# Patient Record
Sex: Male | Born: 1935 | Race: White | Hispanic: No | Marital: Single | Smoking: Former smoker
Health system: Southern US, Community
[De-identification: ages and names within clinical notes are randomized; demographics above are authoritative.]

## PROBLEM LIST (undated history)

## (undated) DIAGNOSIS — N400 Enlarged prostate without lower urinary tract symptoms: Secondary | ICD-10-CM

## (undated) DIAGNOSIS — G4733 Obstructive sleep apnea (adult) (pediatric): Secondary | ICD-10-CM

## (undated) DIAGNOSIS — K219 Gastro-esophageal reflux disease without esophagitis: Secondary | ICD-10-CM

## (undated) DIAGNOSIS — E785 Hyperlipidemia, unspecified: Secondary | ICD-10-CM

## (undated) DIAGNOSIS — K4090 Unilateral inguinal hernia, without obstruction or gangrene, not specified as recurrent: Secondary | ICD-10-CM

## (undated) DIAGNOSIS — J383 Other diseases of vocal cords: Secondary | ICD-10-CM

## (undated) DIAGNOSIS — M1990 Unspecified osteoarthritis, unspecified site: Secondary | ICD-10-CM

## (undated) DIAGNOSIS — J45909 Unspecified asthma, uncomplicated: Secondary | ICD-10-CM

## (undated) DIAGNOSIS — F419 Anxiety disorder, unspecified: Secondary | ICD-10-CM

## (undated) DIAGNOSIS — N529 Male erectile dysfunction, unspecified: Secondary | ICD-10-CM

## (undated) DIAGNOSIS — K635 Polyp of colon: Secondary | ICD-10-CM

## (undated) DIAGNOSIS — L719 Rosacea, unspecified: Secondary | ICD-10-CM

## (undated) DIAGNOSIS — J040 Acute laryngitis: Secondary | ICD-10-CM

## (undated) DIAGNOSIS — J309 Allergic rhinitis, unspecified: Secondary | ICD-10-CM

## (undated) HISTORY — DX: Anxiety disorder, unspecified: F41.9

## (undated) HISTORY — DX: Obstructive sleep apnea (adult) (pediatric): G47.33

## (undated) HISTORY — DX: Unspecified osteoarthritis, unspecified site: M19.90

## (undated) HISTORY — DX: Gastro-esophageal reflux disease without esophagitis: K21.9

## (undated) HISTORY — DX: Unilateral inguinal hernia, without obstruction or gangrene, not specified as recurrent: K40.90

## (undated) HISTORY — DX: Other diseases of vocal cords: J38.3

## (undated) HISTORY — DX: Unspecified asthma, uncomplicated: J45.909

## (undated) HISTORY — PX: INGUINAL HERNIA REPAIR: SUR1180

## (undated) HISTORY — DX: Rosacea, unspecified: L71.9

## (undated) HISTORY — DX: Benign prostatic hyperplasia without lower urinary tract symptoms: N40.0

## (undated) HISTORY — DX: Acute laryngitis: J04.0

## (undated) HISTORY — DX: Hyperlipidemia, unspecified: E78.5

## (undated) HISTORY — DX: Male erectile dysfunction, unspecified: N52.9

## (undated) HISTORY — DX: Polyp of colon: K63.5

## (undated) HISTORY — DX: Allergic rhinitis, unspecified: J30.9

---

## 1997-11-22 ENCOUNTER — Encounter: Admission: RE | Admit: 1997-11-22 | Discharge: 1998-02-20 | Payer: Self-pay | Admitting: Family Medicine

## 1998-08-09 ENCOUNTER — Encounter: Payer: Self-pay | Admitting: Surgery

## 1998-08-12 ENCOUNTER — Ambulatory Visit (HOSPITAL_COMMUNITY): Admission: RE | Admit: 1998-08-12 | Discharge: 1998-08-13 | Payer: Self-pay | Admitting: Surgery

## 1998-12-09 ENCOUNTER — Encounter: Admission: RE | Admit: 1998-12-09 | Discharge: 1999-01-07 | Payer: Self-pay | Admitting: Family Medicine

## 2004-05-15 ENCOUNTER — Encounter: Admission: RE | Admit: 2004-05-15 | Discharge: 2004-05-15 | Payer: Self-pay | Admitting: Internal Medicine

## 2007-08-25 HISTORY — PX: COLONOSCOPY: SHX174

## 2008-04-09 ENCOUNTER — Other Ambulatory Visit: Payer: Self-pay

## 2008-04-09 ENCOUNTER — Emergency Department: Payer: Self-pay | Admitting: Emergency Medicine

## 2010-09-25 ENCOUNTER — Other Ambulatory Visit (HOSPITAL_COMMUNITY): Payer: MEDICARE

## 2010-09-26 ENCOUNTER — Ambulatory Visit (HOSPITAL_COMMUNITY): Payer: MEDICARE

## 2010-09-26 ENCOUNTER — Ambulatory Visit (HOSPITAL_COMMUNITY)
Admission: RE | Admit: 2010-09-26 | Discharge: 2010-09-26 | Disposition: A | Discharge status: Home / Self Care | Payer: MEDICARE | Source: Ambulatory Visit | Attending: General Surgery | Admitting: General Surgery

## 2010-09-26 DIAGNOSIS — K4090 Unilateral inguinal hernia, without obstruction or gangrene, not specified as recurrent: Secondary | ICD-10-CM | POA: Insufficient documentation

## 2010-09-26 DIAGNOSIS — Z01818 Encounter for other preprocedural examination: Secondary | ICD-10-CM | POA: Insufficient documentation

## 2010-09-26 LAB — SURGICAL PCR SCREEN
MRSA, PCR: NEGATIVE
Staphylococcus aureus: NEGATIVE

## 2010-09-29 ENCOUNTER — Ambulatory Visit (HOSPITAL_COMMUNITY)
Admission: RE | Admit: 2010-09-29 | Discharge: 2010-09-29 | Disposition: A | Discharge status: Home / Self Care | Payer: MEDICARE | Attending: General Surgery | Admitting: General Surgery

## 2010-09-29 DIAGNOSIS — K4090 Unilateral inguinal hernia, without obstruction or gangrene, not specified as recurrent: Secondary | ICD-10-CM | POA: Insufficient documentation

## 2010-09-29 DIAGNOSIS — J45909 Unspecified asthma, uncomplicated: Secondary | ICD-10-CM | POA: Insufficient documentation

## 2010-09-29 DIAGNOSIS — Z01812 Encounter for preprocedural laboratory examination: Secondary | ICD-10-CM | POA: Insufficient documentation

## 2010-10-04 NOTE — Op Note (Signed)
NAMEVALMORE, ARABIE                 ACCOUNT NO.:  [id number]  MEDICAL RECORD NO.:  [id number]           PATIENT TYPE:  O  LOCATION:  XRAY                         FACILITY:  [facility]  PHYSICIAN:  Adolph Pollack, M.D.DATE OF BIRTH:  25-Sep-1933  DATE OF PROCEDURE: DATE OF DISCHARGE:  09/26/2010                              OPERATIVE REPORT   PREOPERATIVE DIAGNOSIS:  Left inguinal hernia.  POSTOPERATIVE DIAGNOSIS:  Left inguinal hernia.  PROCEDURE:  Left inguinal hernia repair with mesh and On-Q pump placement.  SURGEON:  Adolph Pollack, M.D.  ANESTHESIA:  General/LMA with Marcaine local.  INDICATION:  Mr. Kaltenbach is a 77 year old male who has a known left inguinal hernia.  It is somewhat symptomatic at times.  It is reducible. He now presents for repair of his left inguinal hernia.  The procedure, risks, and aftercare were discussed with him preoperatively.  TECHNIQUE:  He was seen in the holding area, and the left groin marked with my initials.  He was then brought to the operating room, placed supine on the operating table, and given a general anesthetic with LMA. The hair in the left groin was clipped and the area sterilely prepped and draped.  Half-percent Marcaine solution was infiltrated superficially and deep in the left groin.  A left groin incision was made through the skin and subcutaneous tissue until I identified the external oblique aponeurosis. Local anesthetic was infiltrated deep to the external oblique aponeurosis.  An incision was made in the external oblique aponeurosis through the external ring medially and up toward the anterior superior iliac spine laterally.  I then used blunt dissection to expose the internal oblique muscle and aponeurosis superiorly and the shelving edge of the inguinal ligament inferiorly.  A large direct hernia was noted with the sac adherent to the spermatic cord.  Using careful blunt dissection, I was able to separate the sac  from the spermatic cord.  I examined the spermatic cord and no indirect hernia sac was noted.  I subsequently reapproximated the attenuated transversalis fascia to reduce the hernia with a running 2-0 Vicryl suture.  A piece of 3 inch x 6 inch polypropylene mesh was then brought into the field and then anchored 2 cm medial to the pubic tubercle with a 2-0 Prolene suture. The inferior aspect of the mesh was anchored to the shelving edge of the inguinal ligament with a running 2-0 Prolene suture.  This was done to a point approximately 2 cm lateral to the internal ring.  A slit was cut into the mesh, creating two tails.  The two tails were then wrapped around the spermatic cord.  The superior aspect of the mesh was anchored to the internal oblique aponeurosis with interrupted 2-0 Vicryl sutures. The ilioinguinal and iliohypogastric nerves had been identified and were preserved.  Following this, I crossed the two tails of the mesh and anchored them to the shelving edge of the inguinal ligament with a 2-0 Prolene suture. This created a new internal ring which allowed for the introduction of a tip of a hemostat.  The lateral aspect of the mesh was then tucked  deep to the external oblique aponeurosis.  I then examined the area and there was a small amount of bleeding from a testicular vessel; it was controlled with electrocautery and ligation with 2-0 Vicryl suture. Hemostasis was subsequently adequate.  Following this, I then took the Angiocath and peel-away sheath introducer for the On-Q catheter and inserted it lateral to the incision into a space just anterior to the mesh.  The catheter was then introduced to the peel-away sheath, which was removed.  I then closed the external oblique aponeurosis over the catheter and mesh with a running 3-0 Vicryl suture.  The subcutaneous tissue was closed with running 3-0 Vicryl suture.  The skin was closed with 4-0 Monocryl subcuticular stitches.   Steri-Strips were applied to this wound.  I then used Steri-Strips to anchor the On-Q catheter to the skin, and sterile dressings were then applied to both areas.  The pump was hooked up to the catheter and started.  He tolerated the procedure without any apparent complications and was taken to the recovery room in satisfactory condition.     Adolph Pollack, M.D.     Kari Baars  D:  09/29/2010  T:  09/29/2010  Job:  [id number]  cc:   Georgann Housekeeper, MD Fax: [phone]  Harvie Junior, M.D. Fax: [phone]  Electronically Signed by Avel Peace M.D. on 09/29/2010 02:39:24 PM

## 2010-11-10 ENCOUNTER — Other Ambulatory Visit: Payer: Self-pay | Admitting: Internal Medicine

## 2010-11-10 ENCOUNTER — Ambulatory Visit
Admission: RE | Admit: 2010-11-10 | Discharge: 2010-11-10 | Disposition: A | Discharge status: Home / Self Care | Payer: MEDICARE | Source: Ambulatory Visit | Attending: Internal Medicine | Admitting: Internal Medicine

## 2010-11-18 ENCOUNTER — Other Ambulatory Visit: Payer: Self-pay | Admitting: Dermatology

## 2011-01-03 ENCOUNTER — Emergency Department: Payer: Self-pay | Admitting: Unknown Physician Specialty

## 2011-02-23 ENCOUNTER — Other Ambulatory Visit: Payer: Self-pay | Admitting: Dermatology

## 2011-05-30 ENCOUNTER — Emergency Department (HOSPITAL_COMMUNITY)
Admission: EM | Admit: 2011-05-30 | Discharge: 2011-05-30 | Disposition: A | Discharge status: Home / Self Care | Payer: Medicare Other | Attending: Emergency Medicine | Admitting: Emergency Medicine

## 2011-05-30 ENCOUNTER — Emergency Department (HOSPITAL_COMMUNITY): Payer: Medicare Other

## 2011-05-30 DIAGNOSIS — I10 Essential (primary) hypertension: Secondary | ICD-10-CM | POA: Insufficient documentation

## 2011-05-30 DIAGNOSIS — Z79899 Other long term (current) drug therapy: Secondary | ICD-10-CM | POA: Insufficient documentation

## 2011-05-30 DIAGNOSIS — R0789 Other chest pain: Secondary | ICD-10-CM | POA: Insufficient documentation

## 2011-05-30 DIAGNOSIS — F411 Generalized anxiety disorder: Secondary | ICD-10-CM | POA: Insufficient documentation

## 2011-05-30 DIAGNOSIS — E785 Hyperlipidemia, unspecified: Secondary | ICD-10-CM | POA: Insufficient documentation

## 2011-05-30 DIAGNOSIS — K219 Gastro-esophageal reflux disease without esophagitis: Secondary | ICD-10-CM | POA: Insufficient documentation

## 2011-05-30 LAB — POCT I-STAT TROPONIN I: Troponin i, poc: 0 ng/mL (ref 0.00–0.08)

## 2011-05-30 LAB — BASIC METABOLIC PANEL
BUN: 17 mg/dL (ref 6–23)
CO2: 24 mEq/L (ref 19–32)
Calcium: 9.2 mg/dL (ref 8.4–10.5)
Chloride: 103 mEq/L (ref 96–112)
Creatinine, Ser: 0.87 mg/dL (ref 0.50–1.35)
GFR calc Af Amer: >90 mL/min (ref >90)
GFR calc non Af Amer: 83 mL/min — ABNORMAL LOW (ref >90)
Glucose, Bld: 100 mg/dL — ABNORMAL HIGH (ref 70–99)
Potassium: 4 mEq/L (ref 3.5–5.1)
Sodium: 138 mEq/L (ref 135–145)

## 2011-05-30 LAB — URINALYSIS, ROUTINE W REFLEX MICROSCOPIC
Bilirubin Urine: NEGATIVE
Glucose, UA: NEGATIVE mg/dL
Hgb urine dipstick: NEGATIVE
Ketones, ur: NEGATIVE mg/dL
Leukocytes, UA: NEGATIVE
Nitrite: NEGATIVE
Protein, ur: NEGATIVE mg/dL
Specific Gravity, Urine: 1.022 (ref 1.005–1.030)
Urobilinogen, UA: 0.2 mg/dL (ref 0.0–1.0)
pH: 6.5 (ref 5.0–8.0)

## 2011-05-30 LAB — CBC
HCT: 43.5 % (ref 39.0–52.0)
Hemoglobin: 15.5 g/dL (ref 13.0–17.0)
MCH: 30.8 pg (ref 26.0–34.0)
MCHC: 35.6 g/dL (ref 30.0–36.0)
MCV: 86.5 fL (ref 78.0–100.0)
Platelets: 133 10*3/uL — ABNORMAL LOW (ref 150–400)
RBC: 5.03 MIL/uL (ref 4.22–5.81)
RDW: 12.8 % (ref 11.5–15.5)
WBC: 6.8 10*3/uL (ref 4.0–10.5)

## 2011-05-30 LAB — DIFFERENTIAL
Basophils Absolute: 0 10*3/uL (ref 0.0–0.1)
Basophils Relative: 1 % (ref 0–1)
Eosinophils Absolute: 0.2 10*3/uL (ref 0.0–0.7)
Eosinophils Relative: 4 % (ref 0–5)
Lymphocytes Relative: 32 % (ref 12–46)
Lymphs Abs: 2.2 10*3/uL (ref 0.7–4.0)
Monocytes Absolute: 0.5 10*3/uL (ref 0.1–1.0)
Monocytes Relative: 8 % (ref 3–12)
Neutro Abs: 3.9 10*3/uL (ref 1.7–7.7)
Neutrophils Relative %: 57 % (ref 43–77)

## 2012-01-14 ENCOUNTER — Other Ambulatory Visit: Payer: Self-pay | Admitting: Otolaryngology

## 2012-01-14 DIAGNOSIS — R220 Localized swelling, mass and lump, head: Secondary | ICD-10-CM

## 2012-01-14 DIAGNOSIS — R599 Enlarged lymph nodes, unspecified: Secondary | ICD-10-CM

## 2012-01-15 ENCOUNTER — Ambulatory Visit
Admission: RE | Admit: 2012-01-15 | Discharge: 2012-01-15 | Disposition: A | Discharge status: Home / Self Care | Payer: Medicare Other | Source: Ambulatory Visit | Attending: Otolaryngology | Admitting: Otolaryngology

## 2012-01-15 DIAGNOSIS — R221 Localized swelling, mass and lump, neck: Secondary | ICD-10-CM

## 2012-01-15 DIAGNOSIS — R599 Enlarged lymph nodes, unspecified: Secondary | ICD-10-CM

## 2012-01-15 MED ORDER — IOHEXOL 300 MG/ML  SOLN
75.0000 mL | Freq: Once | INTRAMUSCULAR | Status: AC | PRN
  Administered 2012-01-15: 75 mL via INTRAVENOUS

## 2013-01-11 ENCOUNTER — Other Ambulatory Visit: Payer: Self-pay | Admitting: Gastroenterology

## 2013-09-08 ENCOUNTER — Other Ambulatory Visit: Payer: Self-pay | Admitting: Dermatology

## 2014-05-07 ENCOUNTER — Encounter: Payer: Self-pay | Admitting: General Surgery

## 2014-09-29 ENCOUNTER — Emergency Department: Payer: Self-pay | Admitting: Emergency Medicine

## 2014-09-29 LAB — CBC WITH DIFFERENTIAL/PLATELET
BASOS PCT: 0.5 %
Basophil #: 0 10*3/uL (ref 0.0–0.1)
EOS PCT: 3.7 %
Eosinophil #: 0.3 10*3/uL (ref 0.0–0.7)
HCT: 46.3 % (ref 40.0–52.0)
HGB: 15.4 g/dL (ref 13.0–18.0)
LYMPHS ABS: 1.3 10*3/uL (ref 1.0–3.6)
Lymphocyte %: 16.1 %
MCH: 30.4 pg (ref 26.0–34.0)
MCHC: 33.2 g/dL (ref 32.0–36.0)
MCV: 92 fL (ref 80–100)
MONO ABS: 0.8 x10 3/mm (ref 0.2–1.0)
MONOS PCT: 9.2 %
Neutrophil #: 5.8 10*3/uL (ref 1.4–6.5)
Neutrophil %: 70.5 %
PLATELETS: 138 10*3/uL — AB (ref 150–440)
RBC: 5.05 10*6/uL (ref 4.40–5.90)
RDW: 13.7 % (ref 11.5–14.5)
WBC: 8.3 10*3/uL (ref 3.8–10.6)

## 2014-10-01 LAB — WOUND CULTURE

## 2014-12-24 ENCOUNTER — Other Ambulatory Visit: Payer: Self-pay | Admitting: Dermatology

## 2015-08-05 ENCOUNTER — Ambulatory Visit (INDEPENDENT_AMBULATORY_CARE_PROVIDER_SITE_OTHER): Payer: Medicare Other

## 2015-08-05 ENCOUNTER — Encounter: Payer: Self-pay | Admitting: Sports Medicine

## 2015-08-05 ENCOUNTER — Ambulatory Visit (INDEPENDENT_AMBULATORY_CARE_PROVIDER_SITE_OTHER): Payer: Medicare Other | Admitting: Sports Medicine

## 2015-08-05 VITALS — BP 131/78 | HR 55 | Temp 97.8°F | Resp 16 | Wt 163.1 lb

## 2015-08-05 DIAGNOSIS — M791 Myalgia: Secondary | ICD-10-CM | POA: Diagnosis not present

## 2015-08-05 DIAGNOSIS — M609 Myositis, unspecified: Secondary | ICD-10-CM | POA: Diagnosis not present

## 2015-08-05 DIAGNOSIS — IMO0001 Reserved for inherently not codable concepts without codable children: Secondary | ICD-10-CM

## 2015-08-05 DIAGNOSIS — M5412 Radiculopathy, cervical region: Secondary | ICD-10-CM | POA: Diagnosis not present

## 2015-08-05 DIAGNOSIS — M5030 Other cervical disc degeneration, unspecified cervical region: Secondary | ICD-10-CM | POA: Insufficient documentation

## 2015-08-05 MED ORDER — PREDNISONE 50 MG PO TABS: ORAL_TABLET | ORAL | Status: DC

## 2015-08-05 MED ORDER — MELOXICAM 15 MG PO TABS: ORAL_TABLET | ORAL | Status: DC

## 2015-08-05 MED ORDER — MELOXICAM 15 MG PO TABS
ORAL_TABLET | ORAL | Status: DC
Start: 2015-08-05 — End: 2015-10-21

## 2015-08-05 NOTE — Assessment & Plan Note (Signed)
Likely right-sided C5 periscapular radiculopathy, x-rays, formal physical therapy, prednisone, meloxicam. Trigger point injections done today. CT scan from 4 years ago shows C3-C7 degenerative disc disease. Return to see me in one month, MRI if no better.

## 2015-08-05 NOTE — Progress Notes (Signed)
   Subjective:    I'm seeing this patient as a consultation for:  Dr. Angelena SoleWeston Saunders, Dr. Georgann HousekeeperKarrar Husain  CC: Right posterior shoulder pain  HPI: This is a pleasant 81 year old male, for the past several months he's had increasing pain that he localizes on the posterior aspect of his right shoulder blade, medial to the medial border of the scapula with radiation around the scapula, denies any numbness or tingling in the hands or fingers. There is no pain over the deltoid and he is okay with overhead activities. He does endorse some neck stiffness.  Past medical history, Surgical history, Family history not pertinant except as noted below, Social history, Allergies, and medications have been entered into the medical record, reviewed, and no changes needed.   Review of Systems: No headache, visual changes, nausea, vomiting, diarrhea, constipation, dizziness, abdominal pain, skin rash, fevers, chills, night sweats, weight loss, swollen lymph nodes, body aches, joint swelling, muscle aches, chest pain, shortness of breath, mood changes, visual or auditory hallucinations.   Objective:   General: Well Developed, well nourished, and in no acute distress.  Neuro/Psych: Alert and oriented x3, extra-ocular muscles intact, able to move all 4 extremities, sensation grossly intact. Skin: Warm and dry, no rashes noted.  Respiratory: Not using accessory muscles, speaking in full sentences, trachea midline.  Cardiovascular: Pulses palpable, no extremity edema. Abdomen: Does not appear distended. Right Shoulder: Inspection reveals no abnormalities, atrophy or asymmetry. Palpation is normal with no tenderness over AC joint or bicipital groove. ROM is full in all planes. Rotator cuff strength normal throughout. No signs of impingement with negative Neer and Hawkin's tests, empty can. Speeds and Yergason's tests normal. No labral pathology noted with negative Obrien's, negative crank, negative clunk, and  good stability. Normal scapular function observed. No painful arc and no drop arm sign. No apprehension sign Neck: Negative spurling's Full neck range of motion Grip strength and sensation normal in bilateral hands Strength good C4 to T1 distribution No sensory change to C4 to T1 Reflexes normal There are 2 palpable trigger points along the right trapezius muscle.  Procedure:  Injection of right trapezial trigger points 2 Consent obtained and verified. Time-out conducted. Noted no overlying erythema, induration, or other signs of local infection. Skin prepped in a sterile fashion. Topical analgesic spray: Ethyl chloride. Completed without difficulty. Meds: A total of 1 mL kenalog 40, 1 mL lidocaine spread out between the 2 trigger points. Pain immediately improved suggesting accurate placement of the medication. Advised to call if fevers/chills, erythema, induration, drainage, or persistent bleeding.  Impression and Recommendations:   This case required medical decision making of moderate complexity.

## 2015-08-09 ENCOUNTER — Ambulatory Visit (INDEPENDENT_AMBULATORY_CARE_PROVIDER_SITE_OTHER): Payer: Medicare Other | Admitting: Rehabilitative and Restorative Service Providers"

## 2015-08-09 ENCOUNTER — Encounter: Payer: Self-pay | Admitting: Rehabilitative and Restorative Service Providers"

## 2015-08-09 DIAGNOSIS — M2560 Stiffness of unspecified joint, not elsewhere classified: Secondary | ICD-10-CM

## 2015-08-09 DIAGNOSIS — R6889 Other general symptoms and signs: Secondary | ICD-10-CM

## 2015-08-09 DIAGNOSIS — Z7409 Other reduced mobility: Secondary | ICD-10-CM

## 2015-08-09 DIAGNOSIS — M623 Immobility syndrome (paraplegic): Secondary | ICD-10-CM

## 2015-08-09 DIAGNOSIS — R531 Weakness: Secondary | ICD-10-CM

## 2015-08-09 DIAGNOSIS — M542 Cervicalgia: Secondary | ICD-10-CM | POA: Diagnosis not present

## 2015-08-09 NOTE — Therapy (Signed)
[facility] [place] [address] [place], [place], [address] Phone: [phone]   Fax:  [phone]  Physical Therapy Evaluation  Patient Details  Name: Alejandro Hicks MRN: [id number] Date of Birth: [birth year] Referring Provider: Dr Karie Schwalbe  Encounter Date: 08/09/2015      PT End of Session - 08/09/15 1711    Visit Number 1   Number of Visits 12   Date for PT Re-Evaluation 09/19/15   PT Start Time 1431   PT Stop Time 1525   PT Time Calculation (min) 54 min   Activity Tolerance Patient tolerated treatment well      Past Medical History  Diagnosis Date  . GERD (gastroesophageal reflux disease)   . Allergic rhinitis   . OA (osteoarthritis)   . Anxiety   . Leukoplakia of vocal cords   . BPH (benign prostatic hypertrophy)   . ED (erectile dysfunction)   . OSA (obstructive sleep apnea)     Does not use CPAP  . Dyslipidemia   . Asthma   . Inguinal hernia     Left S/P repair in 2012  . Colonic polyp   . Reflux laryngitis     ENT ecal, PPIs twice a day  . Rosacea     Past Surgical History  Procedure Laterality Date  . Colonoscopy  2009  . Inguinal hernia repair Left     2012    There were no vitals filed for this visit.  Visit Diagnosis:  Pain of cervical spine - Plan: PT plan of care cert/re-cert  Stiffness due to immobility - Plan: PT plan of care cert/re-cert  Decreased strength, endurance, and mobility - Plan: PT plan of care cert/re-cert      Subjective Assessment - 08/09/15 1441    Subjective Patient reports that he had neck and arm pain since playing golf in the summer. He experienced pain in the Rt shoulder blade area. he has some numbness into the Lt hand at night when he lies down. He has a sore tender spot in the Rt LB    Pertinent History cervical pain    How long can you sit comfortably? no limit   How long can you stand comfortably? no limit   How long can you walk comfortably? limit   Diagnostic  tests xrays DDD cervical spine    Patient Stated Goals get rid of the pain    Currently in Pain? No/denies            [facility] PT Assessment - 08/09/15 0001    Assessment   Medical Diagnosis cervical radiculopathy    Referring Provider Dr T   Onset Date/Surgical Date 02/06/15   Hand Dominance Right   Next MD Visit 1/17   Prior Therapy no    Precautions   Precautions None   Balance Screen   Has the patient fallen in the past 6 months No   Has the patient had a decrease in activity level because of a fear of falling?  No   Is the patient reluctant to leave their home because of a fear of falling?  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    Leisure golf ~ 3-4 days/wk in the summer - no t in the  winter; yard work occasionally; plays pool in the winter; sits in Medical illustratorrecliner at home    Observation/Other Assessments   Focus on Therapeutic Outcomes (FOTO)  35% limitation    Sensation   Additional Comments WFL's per pt report    Posture/Postural Control   Posture Comments head forward; shoulders rounded and elevated; increased thoracic kyphosis; scapulae abducted and rotated along the thoracic wall    AROM   Overall AROM Comments bilat shoulder ROM limited at end ranges Rt > Lt    Cervical Flexion 41   Cervical Extension 59   Cervical - Right Side Bend 33   Cervical - Left Side Bend 28   Cervical - Right Rotation 64   Cervical - Left Rotation 69   Strength   Overall Strength Comments 5/5 bilat UE's    Palpation   Palpation comment tight pecs/upper trap/leveator Rt > Lt; tender at Rt lower/anterior rib space                    [facility] Adult PT Treatment/Exercise - 08/09/15 0001    Therapeutic Activites    Therapeutic Activities --  myofacial ball release work     Neuro Re-ed    Neuro Re-ed Details  postural correction    Shoulder Exercises: Standing   Other Standing Exercises scap squeeze 10 sec x 10   Other Standing Exercises axial extension 10 sec x 5    Shoulder Exercises: Stretch   Corner Stretch Limitations doorway x 3 positrons 3 resp x 30 sec    Other Shoulder Stretches supine snow angel 3-4 min arms at ~80 deg    Moist Heat Therapy   Number Minutes Moist Heat 15 Minutes   Moist Heat Location Shoulder  Rt rib cage/lateral trunk   Electrical Stimulation   Electrical Stimulation Location shoulder   Rt x2 shd; Rt lower rib area at tender/TP's x 2 electrodes   Electrical Stimulation Action IFC   Electrical Stimulation Parameters to tolerance   Electrical Stimulation Goals Pain                PT Education - 08/09/15 1708    Education provided Yes   Education Details posture and alignment; HEP; myofacial ball release work    Teacher, musicerson(s) Educated Patient   Methods Explanation;Demonstration;Tactile cues;Verbal cues;Handout   Comprehension Verbalized understanding;Returned demonstration;Verbal cues required;Tactile cues required             PT Long Term Goals - 08/09/15 1718    PT LONG TERM GOAL #1   Title Improve posture and alignment with t deom more upright posture 09/19/15   Time 6   Period Weeks   Status New   PT LONG TERM GOAL #2   Title Improve cervical lateral flexion by 5-10 degrees bilat 09/19/15   Time 6   Period Weeks   Status New   PT LONG TERM GOAL #3   Title Decrease pain to 0/10 to 2/10 for 75-80% of day 09/19/15   Time 6   Period Weeks   Status New   PT LONG TERM GOAL #4   Title I In HEP for discharge 09/19/15   Time 6   Period Weeks   Status New   PT LONG TERM GOAL #5   Title Improve FOT to </= 34% limitation 09/19/15   Time 6   Period Weeks   Status New               Plan - 08/09/15 1712  Clinical Impression Statement Patient presents with c/o cervical and Rt shoulder pain which  has been present for several months. Symptoms have improved with injections and predisone however he continues to have pain in the Rt shoulder blade area. Pt has limited cervical mobility; muscular tenderness and tightness to papation; abnormal posture and alignment; pain and limited functional activity level. He will benefit from PT to address limitations as identified.    Pt will benefit from skilled therapeutic intervention in order to improve on the following deficits Postural dysfunction;Improper body mechanics;Decreased range of motion;Decreased mobility;Pain;Decreased activity tolerance;Decreased endurance   Rehab Potential Good   PT Frequency 2x / week   PT Duration 6 weeks   PT Treatment/Interventions Patient/family education;ADLs/Self Care Home Management;Neuromuscular re-education;Therapeutic exercise;Therapeutic activities;Manual techniques;Dry needling;Cryotherapy;Electrical Stimulation;Moist Heat;Ultrasound   PT Next Visit Plan postural correctioin; stretching; posterior shoulder girdle strengthening; manual work through cervical and shoulder area; modalities as needed   PT Home Exercise Plan postural correction HEP myofacial ball release work    Financial planner with Plan of Care Patient         Problem List Patient Active Problem List   Diagnosis Date Noted  . Right cervical radiculopathy 08/05/2015    Celyn Rober Minion PT, MPH  08/09/2015, 5:25 PM  [facility] [address] [place], [place], [address] Phone: [phone]   Fax:  [phone]  Name: Alejandro Hicks MRN: [id number] Date of Birth: [birth year]

## 2015-08-09 NOTE — Patient Instructions (Signed)
Axial Extension (Chin Tuck)    Pull chin in and lengthen back of neck. Hold __10__ seconds while counting out loud. Repeat _5-10 ___ times. Do several ____ sessions per day.   Shoulder Blade Squeeze    Rotate shoulders back, then squeeze shoulder blades down and back. Hold 10 sec Repeat _10___ times. Do __several __ sessions per day. Can use swim noodle along spine to help stand straight   Scapula Adduction With Pectoralis Stretch: Low - Standing   Shoulders at 45 hands even with shoulders, keeping weight through legs, shift weight forward until you feel pull or stretch through the front of your chest. Hold _30__ seconds. Do _3__ times, _2-4__ times per day.   Scapula Adduction With Pectoralis Stretch: Mid-Range - Standing   Shoulders at 90 elbows even with shoulders, keeping weight through legs, shift weight forward until you feel pull or strength through the front of your chest. Hold __30_ seconds. Do _3__ times, __2-4_ times per day.   Scapula Adduction With Pectoralis Stretch: High - Standing   Shoulders at 120 hands up high on the doorway, keeping weight on feet, shift weight forward until you feel pull or stretch through the front of your chest. Hold _30__ seconds. Do _3__ times, _2-3__ times per day.

## 2015-08-13 ENCOUNTER — Ambulatory Visit (INDEPENDENT_AMBULATORY_CARE_PROVIDER_SITE_OTHER): Payer: Medicare Other | Admitting: Physical Therapy

## 2015-08-13 DIAGNOSIS — M2560 Stiffness of unspecified joint, not elsewhere classified: Secondary | ICD-10-CM

## 2015-08-13 DIAGNOSIS — M623 Immobility syndrome (paraplegic): Secondary | ICD-10-CM

## 2015-08-13 DIAGNOSIS — Z7409 Other reduced mobility: Secondary | ICD-10-CM | POA: Diagnosis not present

## 2015-08-13 DIAGNOSIS — M542 Cervicalgia: Secondary | ICD-10-CM | POA: Diagnosis not present

## 2015-08-13 DIAGNOSIS — R531 Weakness: Secondary | ICD-10-CM

## 2015-08-13 NOTE — Patient Instructions (Signed)
    On back, knees bent, feet flat, left hand on left hip, right hand above left. Pull right arm DIAGONALLY (hip to shoulder) across chest. Bring right arm along head toward floor. Hold momentarily. Slowly return to starting position. Repeat __10_ times, 2 sets. Do with left arm. Band color _red_____    Side Pull: Double Arm   On back, knees bent, feet flat. Arms perpendicular to body, shoulder level, elbows straight but relaxed. Pull arms out to sides, elbows straight. Resistance band comes across collarbones, hands toward floor. Hold momentarily. Slowly return to starting position. Repeat ___ times. Band color _____    Shoulder Rotation: Double Arm   On back, knees bent, feet flat, elbows tucked at sides, bent 90, hands palms up. Pull hands apart and down toward floor, keeping elbows near sides. Hold momentarily. Slowly return to starting position. Repeat _10__ times, 2-3 sets. Band color ___red___    [facility] [address] [place], [place] [address]  [phone] (office) [phone] (fax)

## 2015-08-13 NOTE — Therapy (Signed)
[facility] [address] [place], [place], [address] Phone: [phone]   Fax:  [phone]  Physical Therapy Treatment  Patient Details  Name: Alejandro Hicks MRN: [id number] Date of Birth: [birth year] Referring Provider: Dr. Mel Almondhekekkandem  Encounter Date: 08/13/2015      PT End of Session - 08/13/15 1148    Visit Number 2   Number of Visits 12   Date for PT Re-Evaluation 09/19/15   PT Start Time 1148   PT Stop Time 1242   PT Time Calculation (min) 54 min   Activity Tolerance Patient tolerated treatment well;No increased pain      Past Medical History  Diagnosis Date  . GERD (gastroesophageal reflux disease)   . Allergic rhinitis   . OA (osteoarthritis)   . Anxiety   . Leukoplakia of vocal cords   . BPH (benign prostatic hypertrophy)   . ED (erectile dysfunction)   . OSA (obstructive sleep apnea)     Does not use CPAP  . Dyslipidemia   . Asthma   . Inguinal hernia     Left S/P repair in 2012  . Colonic polyp   . Reflux laryngitis     ENT ecal, PPIs twice a day  . Rosacea     Past Surgical History  Procedure Laterality Date  . Colonoscopy  2009  . Inguinal hernia repair Left     2012    There were no vitals filed for this visit.  Visit Diagnosis:  Pain of cervical spine  Stiffness due to immobility  Decreased strength, endurance, and mobility      Subjective Assessment - 08/13/15 1152    Subjective Pt reports he finished Prednisone 2 days ago.  Pain in Lt shoulder blade 1/10, Rt shoulder blade only tender if pressing on it.  Hasn't bought the ball yet.    Currently in Pain? Yes   Pain Score 1    Pain Location Scapula   Pain Orientation Left   Aggravating Factors  participate in a lot for sports    Pain Relieving Factors heat             [facility] PT Assessment - 08/13/15 0001    Assessment   Medical Diagnosis cervical radiculopathy    Referring Provider Dr. Mel Almondhekekkandem   Onset  Date/Surgical Date 02/06/15   Hand Dominance Right   Next MD Visit 1/17   Prior Therapy no             [facility] Adult PT Treatment/Exercise - 08/13/15 0001    Shoulder Exercises: Standing   External Rotation Both;10 reps;Theraband  3 sets   Theraband Level (Shoulder External Rotation) Level 2 (Red)   Other Standing Exercises squeeze around noodle x 5 sec x 10 reps x 2 sets   Other Standing Exercises axial extension with chin tuck (scaps around noodle) x 5 sec hold x 10 reps;  Sash with red band x 10 reps each arm    Shoulder Exercises: ROM/Strengthening   UBE (Upper Arm Bike) L1: alternating forward/ backward each min for 4 min.    Shoulder Exercises: Stretch   Corner Stretch Limitations doorway x 3 positions 3 rep x 30 sec - switched to unilateral for comfort.    Moist Heat Therapy   Number Minutes Moist Heat 15 Minutes   Moist Heat Location Cervical;Shoulder  Rt shoulder   Electrical Stimulation   Electrical Stimulation Location --   Electrical Stimulation Action --   Electrical Stimulation Parameters --  Electrical Stimulation Goals --   Manual Therapy   Manual Therapy Soft tissue mobilization;Myofascial release   Manual therapy comments pt in supine   Soft tissue mobilization to Rt/Lt infraspinatus/teres, levator, pec - TPR to Rt infraspinatus   Myofascial Release suboccipital release (increased tightness R>L)                PT Education - 08/13/15 1233    Education provided Yes   Education Details HEP: added bilat ER and sash    Person(s) Educated Patient   Methods Explanation;Handout   Comprehension Verbalized understanding             PT Long Term Goals - 08/09/15 1718    PT LONG TERM GOAL #1   Title Improve posture and alignment with t deom more upright posture 09/19/15   Time 6   Period Weeks   Status New   PT LONG TERM GOAL #2   Title Improve cervical lateral flexion by 5-10 degrees bilat 09/19/15   Time 6   Period Weeks   Status New   PT  LONG TERM GOAL #3   Title Decrease pain to 0/10 to 2/10 for 75-80% of day 09/19/15   Time 6   Period Weeks   Status New   PT LONG TERM GOAL #4   Title I In HEP for discharge 09/19/15   Time 6   Period Weeks   Status New   PT LONG TERM GOAL #5   Title Improve FOT to </= 34% limitation 09/19/15   Time 6   Period Weeks   Status New               Plan - 08/13/15 1234    Clinical Impression Statement Pt continues with discomfort in Rt/Lt posterior shoulder girdle and tightness in bilat pecs.  Pt tolerted all exercises well without increase in symptoms. Pt declined estim this visit and opted for MHP to decreased soreness following manual therapy.  PRogressing towards goals.    Pt will benefit from skilled therapeutic intervention in order to improve on the following deficits Postural dysfunction;Improper body mechanics;Decreased range of motion;Decreased mobility;Pain;Decreased activity tolerance;Decreased endurance   Rehab Potential Good   PT Frequency 2x / week   PT Duration 6 weeks   PT Treatment/Interventions Patient/family education;ADLs/Self Care Home Management;Neuromuscular re-education;Therapeutic exercise;Therapeutic activities;Manual techniques;Dry needling;Cryotherapy;Electrical Stimulation;Moist Heat;Ultrasound   PT Next Visit Plan postural correctioin; stretching; posterior shoulder girdle strengthening; manual work through cervical and shoulder area; modalities as needed   Consulted and Agree with Plan of Care Patient        Problem List Patient Active Problem List   Diagnosis Date Noted  . Right cervical radiculopathy 08/05/2015    Mayer Camel, PTA 08/13/2015 12:44 PM  [facility] [place] [address] [place], [place], [address] Phone: [phone]   Fax:  [phone]  Name: Alejandro Hicks MRN: [id number] Date of Birth: 04-06-1934

## 2015-08-16 ENCOUNTER — Encounter: Payer: Medicare Other | Admitting: Rehabilitative and Restorative Service Providers"

## 2015-08-29 ENCOUNTER — Ambulatory Visit (INDEPENDENT_AMBULATORY_CARE_PROVIDER_SITE_OTHER): Payer: Medicare Other | Admitting: Physical Therapy

## 2015-08-29 DIAGNOSIS — M2560 Stiffness of unspecified joint, not elsewhere classified: Secondary | ICD-10-CM

## 2015-08-29 DIAGNOSIS — R6889 Other general symptoms and signs: Secondary | ICD-10-CM

## 2015-08-29 DIAGNOSIS — R531 Weakness: Secondary | ICD-10-CM

## 2015-08-29 DIAGNOSIS — M542 Cervicalgia: Secondary | ICD-10-CM

## 2015-08-29 DIAGNOSIS — Z7409 Other reduced mobility: Secondary | ICD-10-CM

## 2015-08-29 DIAGNOSIS — M623 Immobility syndrome (paraplegic): Secondary | ICD-10-CM | POA: Diagnosis not present

## 2015-08-29 NOTE — Patient Instructions (Addendum)
Levator Scapula Stretch, Sitting    Sit, one hand tucked under hip on side to be stretched, other hand over top of head. Turn head toward other side and look down. Use hand on head to gently stretch neck in that position. Hold _15-30__ seconds. Repeat __2-3_ times per session. Do _2__ sessions per day.

## 2015-08-29 NOTE — Therapy (Signed)
[facility] [address] [place], [place], [address] Phone: [phone]   Fax:  [phone]  Physical Therapy Treatment  Patient Details  Name: Alejandro Hicks MRN: [id number] Date of Birth: 01/15/1934 Referring Provider: Dr. Thekekkandem  Encounter Date: 08/29/2015      PT End of Session - 08/29/15 1101    Visit Number 3   Number of Visits 12   Date for PT Re-Evaluation 09/19/15   PT Start Time 1100   PT Stop Time 1149   PT Time Calculation (min) 49 min      Past Medical History  Diagnosis Date  . GERD (gastroesophageal reflux disease)   . Allergic rhinitis   . OA (osteoarthritis)   . Anxiety   . Leukoplakia of vocal cords   . BPH (benign prostatic hypertrophy)   . ED (erectile dysfunction)   . OSA (obstructive sleep apnea)     Does not use CPAP  . Dyslipidemia   . Asthma   . Inguinal hernia     Left S/P repair in 2012  . Colonic polyp   . Reflux laryngitis     ENT ecal, PPIs twice a day  . Rosacea     Past Surgical History  Procedure Laterality Date  . Colonoscopy  2009  . Inguinal hernia repair Left     2012    There were no vitals filed for this visit.  Visit Diagnosis:  Stiffness due to immobility  Pain of cervical spine  Decreased strength, endurance, and mobility      Subjective Assessment - 08/29/15 1103    Subjective Pt reports he is still taking Meloxicam; would like to stop taking that.  His Rt shoulder blade pain comes and goes.  Today, doesn't hurt unless pressing on it.  Pt reports he continues to perform original HEP.    Currently in Pain? No/denies            [facility] PT Assessment - 08/29/15 0001    Assessment   Medical Diagnosis cervical radiculopathy    Referring Provider Dr. Thekekkandem   Onset Date/Surgical Date 02/06/15   Hand Dominance Right   Next MD Visit 09/02/15   Prior Therapy no    Observation/Other Assessments   Focus on Therapeutic Outcomes (FOTO)  36%  limited    AROM   Cervical Flexion 58   Cervical Extension 41   Cervical - Right Side Bend 28   Cervical - Left Side Bend 25   Cervical - Right Rotation 65   Cervical - Left Rotation 68           [facility] Adult PT Treatment/Exercise - 08/29/15 0001    Exercises   Exercises Shoulder;Neck   Shoulder Exercises: Supine   Horizontal ABduction Both;Theraband;15 reps   Theraband Level (Shoulder Horizontal ABduction) Level 3 (Green);Level 4 (Blue)  1 set with green, blue   External Rotation Strengthening;Both;15 reps   Theraband Level (Shoulder External Rotation) Level 3 (Green);Level 4 (Blue)  1 set with each green, blue   Other Supine Exercises Sash with green band x 10 reps each shoulder    Shoulder Exercises: Standing   External Rotation --   Theraband Level (Shoulder External Rotation) --   Internal Rotation --   Shoulder Exercises: ROM/Strengthening   UBE (Upper Arm Bike) L3: alternating forward/ backward each min for 4 min.    Shoulder Exercises: Stretch   Other Shoulder Stretches Doorway stretch 3 positions x 30 sec each position; some discomfort in   superior shoulder with high position.   Moist Heat Therapy   Number Minutes Moist Heat 10 Minutes   Moist Heat Location Cervical;Shoulder  Rt shoulder   Neck Exercises: Stretches   Upper Trapezius Stretch 2 reps  15 sec each direction -2 sets   Levator Stretch 2 reps  15 sec each side, 2 sets           PT Education - 08/29/15 1138    Education provided Yes   Education Details HEP; added levator stretch. Pt issued green and blue band for existing HEP.     Person(s) Educated Patient   Methods Explanation;Handout;Demonstration   Comprehension Returned demonstration;Verbalized understanding             PT Long Term Goals - 08/29/15 1127    PT LONG TERM GOAL #1   Title Improve posture and alignment with demo more upright posture 09/19/15   Time 6   Period Weeks   Status Achieved   PT LONG TERM GOAL #2   Title  Improve cervical lateral flexion by 5-10 degrees bilat 09/19/15   Time 6   Period Weeks   Status Not Met   PT LONG TERM GOAL #3   Title Decrease pain to 0/10 to 2/10 for 75-80% of day 09/19/15   Time 6   Period Weeks   Status Achieved   PT LONG TERM GOAL #4   Title I In HEP for discharge 09/19/15   Time 6   Period Weeks   Status Achieved   PT LONG TERM GOAL #5   Title Improve FOTO to </= 34% limitation 09/19/15   Time 6   Period Weeks   Status Not Met               Plan - 08/29/15 1133    Clinical Impression Statement Pt continues with tight lateral neck flexors; has not met LTG #2. Pt scored 36% on FOTO, 1% worse than intake however pt reports he is feeling better than initial eval; LTG # 5 not met.  Pt has met other goals and pt requests to d/c to HEP. Pt reports he is satisfied with current level of function for neck/shoulder.    Pt will benefit from skilled therapeutic intervention in order to improve on the following deficits Postural dysfunction;Improper body mechanics;Decreased range of motion;Decreased mobility;Pain;Decreased activity tolerance;Decreased endurance   Rehab Potential Good   PT Frequency 2x / week   PT Duration 6 weeks   PT Treatment/Interventions Patient/family education;ADLs/Self Care Home Management;Neuromuscular re-education;Therapeutic exercise;Therapeutic activities;Manual techniques;Dry needling;Cryotherapy;Electrical Stimulation;Moist Heat;Ultrasound   PT Next Visit Plan Spoke to supervising PT; will d/c to HEP.    Consulted and Agree with Plan of Care Patient        Problem List Patient Active Problem List   Diagnosis Date Noted  . Right cervical radiculopathy 08/05/2015   Jennifer Carlson-Long, PTA 08/29/2015 11:57 AM  [facility] [address] [place], [place], [address] Phone: [phone]   Fax:  [phone]  Name: Alejandro Hicks MRN: [id number] Date of Birth:  11/20/1933    PHYSICAL THERAPY DISCHARGE SUMMARY  Visits from Start of Care: 3  Current functional level related to goals / functional outcomes: Improvement noted and reported with treatment.    Remaining deficits: Some pain with pressure over area involved   Education / Equipment: HEP/theraband  Plan: Patient agrees to discharge.  Patient goals were partially met. Patient is being discharged due to being pleased with the current   functional level.  ?????    Celyn P. Holt PT, MPH 08/29/2015 3:55 PM    

## 2015-09-02 ENCOUNTER — Ambulatory Visit (INDEPENDENT_AMBULATORY_CARE_PROVIDER_SITE_OTHER): Payer: Medicare Other | Admitting: Sports Medicine

## 2015-09-02 ENCOUNTER — Ambulatory Visit (INDEPENDENT_AMBULATORY_CARE_PROVIDER_SITE_OTHER): Payer: Medicare Other

## 2015-09-02 ENCOUNTER — Encounter: Payer: Self-pay | Admitting: Sports Medicine

## 2015-09-02 VITALS — BP 153/83 | HR 67 | Temp 97.9°F | Resp 18 | Wt 160.8 lb

## 2015-09-02 DIAGNOSIS — M5412 Radiculopathy, cervical region: Secondary | ICD-10-CM

## 2015-09-02 DIAGNOSIS — M8588 Other specified disorders of bone density and structure, other site: Secondary | ICD-10-CM

## 2015-09-02 DIAGNOSIS — M51369 Other intervertebral disc degeneration, lumbar region without mention of lumbar back pain or lower extremity pain: Secondary | ICD-10-CM | POA: Insufficient documentation

## 2015-09-02 DIAGNOSIS — M5136 Other intervertebral disc degeneration, lumbar region: Secondary | ICD-10-CM

## 2015-09-02 NOTE — Assessment & Plan Note (Signed)
Ordering an MRI, continue physical therapy. Return to go over MRI results.

## 2015-09-02 NOTE — Progress Notes (Signed)
  Subjective:    CC: Follow-up  HPI: Cervical degenerative disc disease: Improved moderately with physical therapy however still having some symptoms, the trigger point injections were also helpful.  Low back pain: Right-sided, axial and worse with sitting, flexion, nothing overtly radicular. Desires that we also obtain MRIs. He has been doing formal physical therapy including traction. No bowel or bladder dysfunction, no saddle numbness, no constitutional symptoms.  Past medical history, Surgical history, Family history not pertinant except as noted below, Social history, Allergies, and medications have been entered into the medical record, reviewed, and no changes needed.   Review of Systems: No fevers, chills, night sweats, weight loss, chest pain, or shortness of breath.   Objective:    General: Well Developed, well nourished, and in no acute distress.  Neuro: Alert and oriented x3, extra-ocular muscles intact, sensation grossly intact.  HEENT: Normocephalic, atraumatic, pupils equal round reactive to light, neck supple, no masses, no lymphadenopathy, thyroid nonpalpable.  Skin: Warm and dry, no rashes. Cardiac: Regular rate and rhythm, no murmurs rubs or gallops, no lower extremity edema.  Respiratory: Clear to auscultation bilaterally. Not using accessory muscles, speaking in full sentences. Back Exam:  Inspection: Unremarkable  Motion: Flexion 45 deg, Extension 45 deg, Side Bending to 45 deg bilaterally,  Rotation to 45 deg bilaterally  SLR laying: Negative  XSLR laying: Negative  Palpable tenderness: Right quadratus lumborum. FABER: negative. Sensory change: Gross sensation intact to all lumbar and sacral dermatomes.  Reflexes: 2+ at both patellar tendons, 2+ at achilles tendons, Babinski's downgoing.  Strength at foot  Plantar-flexion: 5/5 Dorsi-flexion: 5/5 Eversion: 5/5 Inversion: 5/5  Leg strength  Quad: 5/5 Hamstring: 5/5 Hip flexor: 5/5 Hip abductors: 5/5  Gait  unremarkable.  Impression and Recommendations:

## 2015-09-02 NOTE — Assessment & Plan Note (Signed)
Right-sided discogenic back pain without radiculopathy. Has been doing physician directed rehabilitation exercises including traction for months. X-ray, MRI lumbar spine.

## 2015-09-09 ENCOUNTER — Ambulatory Visit (INDEPENDENT_AMBULATORY_CARE_PROVIDER_SITE_OTHER): Payer: Medicare Other

## 2015-09-09 DIAGNOSIS — M47892 Other spondylosis, cervical region: Secondary | ICD-10-CM

## 2015-09-09 DIAGNOSIS — M5412 Radiculopathy, cervical region: Secondary | ICD-10-CM

## 2015-09-09 DIAGNOSIS — M4802 Spinal stenosis, cervical region: Secondary | ICD-10-CM

## 2015-09-09 DIAGNOSIS — M5136 Other intervertebral disc degeneration, lumbar region: Secondary | ICD-10-CM | POA: Diagnosis not present

## 2015-09-16 ENCOUNTER — Encounter: Payer: Self-pay | Admitting: Sports Medicine

## 2015-09-16 ENCOUNTER — Ambulatory Visit (INDEPENDENT_AMBULATORY_CARE_PROVIDER_SITE_OTHER): Payer: Medicare Other | Admitting: Sports Medicine

## 2015-09-16 VITALS — BP 140/79 | HR 66 | Resp 18 | Wt 162.1 lb

## 2015-09-16 DIAGNOSIS — M5136 Other intervertebral disc degeneration, lumbar region: Secondary | ICD-10-CM | POA: Diagnosis not present

## 2015-09-16 DIAGNOSIS — M5412 Radiculopathy, cervical region: Secondary | ICD-10-CM

## 2015-09-16 DIAGNOSIS — M51369 Other intervertebral disc degeneration, lumbar region without mention of lumbar back pain or lower extremity pain: Secondary | ICD-10-CM

## 2015-09-16 MED ORDER — GABAPENTIN 300 MG PO CAPS
ORAL_CAPSULE | ORAL | Status: DC
Start: 2015-09-16 — End: 2015-11-18

## 2015-09-16 NOTE — Assessment & Plan Note (Signed)
Discs look okay, there is a small broad-based L5-S1 disc protrusion and mild facet arthritis. Continue meloxicam, before proceeding to intervention we are going to add gabapentin and up taper

## 2015-09-16 NOTE — Assessment & Plan Note (Signed)
Just as with a lumbar spine we will proceed conservatively with the addition of gabapentin before considering intervention with an epidural. He is having occasionally left periscapular cervical radiculopathy. Sometimes it's on the right side.

## 2015-09-16 NOTE — Progress Notes (Signed)
  Subjective:    CC: MRI results  HPI: Cervical degenerative disc disease: With left-sided periscapular radiculopathy, was right-sided upper arm at the last visit, mild.  Back pain and discogenic, worse with sitting, flexion, driving, MRI did show an Z6-X0 disc protrusion without evidence of foraminal stenosis. Pain is mild.  Past medical history, Surgical history, Family history not pertinant except as noted below, Social history, Allergies, and medications have been entered into the medical record, reviewed, and no changes needed.   Review of Systems: No fevers, chills, night sweats, weight loss, chest pain, or shortness of breath.   Objective:    General: Well Developed, well nourished, and in no acute distress.  Neuro: Alert and oriented x3, extra-ocular muscles intact, sensation grossly intact.  HEENT: Normocephalic, atraumatic, pupils equal round reactive to light, neck supple, no masses, no lymphadenopathy, thyroid nonpalpable.  Skin: Warm and dry, no rashes. Cardiac: Regular rate and rhythm, no murmurs rubs or gallops, no lower extremity edema.  Respiratory: Clear to auscultation bilaterally. Not using accessory muscles, speaking in full sentences.  Impression and Recommendations:    I spent 25 minutes with this patient, greater than 50% was face-to-face time counseling regarding the above diagnoses

## 2015-10-21 ENCOUNTER — Encounter: Payer: Self-pay | Admitting: Sports Medicine

## 2015-10-21 ENCOUNTER — Ambulatory Visit (INDEPENDENT_AMBULATORY_CARE_PROVIDER_SITE_OTHER): Payer: Medicare Other | Admitting: Sports Medicine

## 2015-10-21 VITALS — BP 164/83 | HR 78 | Resp 18 | Wt 160.0 lb

## 2015-10-21 DIAGNOSIS — M5412 Radiculopathy, cervical region: Secondary | ICD-10-CM

## 2015-10-21 DIAGNOSIS — I10 Essential (primary) hypertension: Secondary | ICD-10-CM

## 2015-10-21 DIAGNOSIS — M5136 Other intervertebral disc degeneration, lumbar region: Secondary | ICD-10-CM

## 2015-10-21 DIAGNOSIS — M51369 Other intervertebral disc degeneration, lumbar region without mention of lumbar back pain or lower extremity pain: Secondary | ICD-10-CM

## 2015-10-21 MED ORDER — VALSARTAN 160 MG PO TABS: 160.0000 mg | ORAL_TABLET | Freq: Every day | ORAL | Status: DC

## 2015-10-21 NOTE — Assessment & Plan Note (Signed)
Good response to gabapentin with near complete pain relief however blood pressure did increase, he thought this was due to the gabapentin, but we also started meloxicam. Restart gabapentin and discontinue meloxicam. 

## 2015-10-21 NOTE — Assessment & Plan Note (Signed)
Discontinue losartan, switching to valsartan 160

## 2015-10-21 NOTE — Progress Notes (Signed)
  Subjective:    CC: Follow-up  HPI: Cervical and lumbar radiculopathy: Essentially resolved with gabapentin, meloxicam, his blood pressure did increase, he got gabapentin and discontinued it, pain has returned but the blood pressure is still elevated, continues to take meloxicam. No headaches, visual changes, chest pain.  Past medical history, Surgical history, Family history not pertinant except as noted below, Social history, Allergies, and medications have been entered into the medical record, reviewed, and no changes needed.   Review of Systems: No fevers, chills, night sweats, weight loss, chest pain, or shortness of breath.   Objective:    General: Well Developed, well nourished, and in no acute distress.  Neuro: Alert and oriented x3, extra-ocular muscles intact, sensation grossly intact.  HEENT: Normocephalic, atraumatic, pupils equal round reactive to light, neck supple, no masses, no lymphadenopathy, thyroid nonpalpable.  Skin: Warm and dry, no rashes. Cardiac: Regular rate and rhythm, no murmurs rubs or gallops, no lower extremity edema.  Respiratory: Clear to auscultation bilaterally. Not using accessory muscles, speaking in full sentences.  Impression and Recommendations:    I spent 25 minutes with this patient, greater than 50% was face-to-face time counseling regarding the above diagnoses

## 2015-10-21 NOTE — Assessment & Plan Note (Signed)
Good response to gabapentin with near complete pain relief however blood pressure did increase, he thought this was due to the gabapentin, but we also started meloxicam. Restart gabapentin and discontinue meloxicam.

## 2015-11-04 ENCOUNTER — Encounter: Payer: Self-pay | Admitting: Sports Medicine

## 2015-11-04 ENCOUNTER — Ambulatory Visit (INDEPENDENT_AMBULATORY_CARE_PROVIDER_SITE_OTHER): Payer: Medicare Other | Admitting: Sports Medicine

## 2015-11-04 DIAGNOSIS — I10 Essential (primary) hypertension: Secondary | ICD-10-CM

## 2015-11-04 MED ORDER — VALSARTAN-HYDROCHLOROTHIAZIDE 320-25 MG PO TABS: 1.0000 | ORAL_TABLET | Freq: Every day | ORAL | Status: DC

## 2015-11-04 NOTE — Assessment & Plan Note (Signed)
Increasing BP medication to  Valsartan/Hydrocort thiazide. Next line return in one month.

## 2015-11-04 NOTE — Progress Notes (Signed)
  Subjective:    CC:  Follow-up  HPI: Hypertension: Improved but not perfect, no headaches, visual changes, chest pain  Cervical and lumbar degenerative disc disease: Better with restarting gabapentin, only doing 1 pill 3 times per day.  Past medical history, Surgical history, Family history not pertinant except as noted below, Social history, Allergies, and medications have been entered into the medical record, reviewed, and no changes needed.   Review of Systems: No fevers, chills, night sweats, weight loss, chest pain, or shortness of breath.   Objective:    General: Well Developed, well nourished, and in no acute distress.  Neuro: Alert and oriented x3, extra-ocular muscles intact, sensation grossly intact.  HEENT: Normocephalic, atraumatic, pupils equal round reactive to light, neck supple, no masses, no lymphadenopathy, thyroid nonpalpable.  Skin: Warm and dry, no rashes. Cardiac: Regular rate and rhythm, no murmurs rubs or gallops, no lower extremity edema.  Respiratory: Clear to auscultation bilaterally. Not using accessory muscles, speaking in full sentences.   Impression and Recommendations:

## 2015-11-06 ENCOUNTER — Ambulatory Visit (INDEPENDENT_AMBULATORY_CARE_PROVIDER_SITE_OTHER): Payer: Medicare Other | Admitting: Allergy and Immunology

## 2015-11-06 ENCOUNTER — Encounter: Payer: Self-pay | Admitting: Allergy and Immunology

## 2015-11-06 VITALS — BP 112/60 | HR 68 | Temp 97.8°F | Resp 16 | Ht 66.0 in | Wt 165.8 lb

## 2015-11-06 DIAGNOSIS — J4520 Mild intermittent asthma, uncomplicated: Secondary | ICD-10-CM | POA: Diagnosis not present

## 2015-11-06 DIAGNOSIS — J310 Chronic rhinitis: Secondary | ICD-10-CM | POA: Diagnosis not present

## 2015-11-06 DIAGNOSIS — R05 Cough: Secondary | ICD-10-CM | POA: Diagnosis not present

## 2015-11-06 DIAGNOSIS — R059 Cough, unspecified: Secondary | ICD-10-CM

## 2015-11-06 MED ORDER — AZELASTINE-FLUTICASONE 137-50 MCG/ACT NA SUSP: NASAL | Status: DC

## 2015-11-06 MED ORDER — MONTELUKAST SODIUM 10 MG PO TABS: 10.0000 mg | ORAL_TABLET | Freq: Every day | ORAL | Status: DC

## 2015-11-06 MED ORDER — ALBUTEROL SULFATE HFA 108 (90 BASE) MCG/ACT IN AERS: INHALATION_SPRAY | RESPIRATORY_TRACT | Status: DC

## 2015-11-06 NOTE — Patient Instructions (Signed)
  Take Home Sheet  1. Avoidance:  Significant temp changes.   2. Antihistamine: Xyzal 5mg  by mouth once daily for runny nose or itching.   3. Nasal Spray: Dymista one spray(s) each nostril twice daily for stuffy nose or drainage.    4. Inhalers:  Rescue: ProAir 2 puffs every 4 hours as needed for cough or wheeze.       -May use 2 puffs 10-20 minutes prior to exercise.   Preventative: QVAR 40mcg 2 puffs twice daily (Rinse, gargle, and spit out after use)  6. Other: Cotinue Singulair 10mg  each evening.     7. Nasal Saline wash each evening at shower time.   8. Follow up Visit: 3-4 months or sooner if needed.  Websites that have reliable Patient information: 1. American Academy of Asthma, Allergy, & Immunology: www.aaaai.org 2. Food Allergy Network: www.foodallergy.org 3. Mothers of Asthmatics: www.aanma.org 4. [facility]: https://www.strong.com/www.njc.org 5. American College of Allergy, Asthma, & Immunology: BiggerRewards.iswww.allergy.mcg.edu or www.acaai.org

## 2015-11-07 NOTE — Progress Notes (Signed)
FOLLOW UP NOTE  RE: Alejandro SheenBobby W Hanser MRN: [id number] DOB: [birth year] [facility] [address] [place] [id number] Date of Office Visit: 11/06/2015  Subjective:  Alejandro Hicks is a 82 y.o. male who presents today for Asthma  Assessment:   1. Cough in a patient with Mild intermittent asthma, afebrile in no respiratory distress and clear lung exam.  2. Mixed rhinitis.   3.    Complex medical history.   Plan:   Meds ordered this encounter  Medications  . montelukast (SINGULAIR) 10 MG tablet    Sig: Take 1 tablet (10 mg total) by mouth at bedtime.    Dispense:  30 tablet    Refill:  3  . albuterol (PROAIR HFA) 108 (90 Base) MCG/ACT inhaler    Sig: Use 2 puffs every 4 hours as needed for cough and wheeze. May use 10-20 minutes before exercise    Dispense:  1 Inhaler    Refill:  1  . Azelastine-Fluticasone (DYMISTA) 137-50 MCG/ACT SUSP    Sig: Use 1 spray per nostril twice a day for stuffy nose or drainage    Dispense:  1 Bottle    Refill:  3   Patient Instructions  1. Avoidance:  Significant temp changes. 2. Antihistamine: Xyzal 5mg  by mouth once daily for runny nose or itching. 3. Nasal Spray: Dymista one spray(s) each nostril twice daily for stuffy nose or drainage, for 10 days then as needed. 4. Inhalers: with spacer.  Rescue: ProAir 2 puffs every 4 hours as needed for cough or wheeze.       -May use 2 puffs 10-20 minutes prior to exercise.  Preventative: QVAR 40mcg 2 puffs twice daily for duration of sample (Rinse, gargle, and spit out after use) 6. Other: Cotinue Singulair 10mg  each evening. 7. Nasal Saline wash each evening at shower time. 8. Prednisone 20mg  daily for 3 days. 9. Follow up Visit: 3-4 months or sooner if needed.  HPI: Alejandro Hicks, who has a history of allergic/non allergic rhinitis and asthma last seen here in our office 09/2013, returns with cough and congestion.  He reports his symptoms began about 2 weeks ago with  rhinorrhea, sneezing, slight congestion, and cough with the warmer weather.  He notices throat clearing of phlegm in the morning without fever, headache, sore throat, wheeze, difficulty in breathing or current discolored drainage.  He is not aware of any sick contacts and otherwise feels well.  He used albuterol once 10 days ago (suspected it was expired inhaler) without persisting need and does remember using Dymista and QVAR both were beneficial.  Denies ED or urgent care visits, prednisone or recurring antibiotic courses. Reports sleep and activity are normal.  Denies other new medical issues.  (Was treated last spring for leg staph skin infection with I&D and antibiotics).  Alejandro Hicks has a current medication list which includes the following prescription(s): alprazolam, aspirin ec, esomeprazole, gabapentin, levocetirizine, menaquinone-7, montelukast, nutritional supplements, pregnenolone micronized, tadalafil, valsartan, valsartan-hydrochlorothiazide, vitamin d (cholecalciferol), albuterol.  Drug Allergies: Allergies  Allergen Reactions  . Lipitor [Atorvastatin]     Myalgia  . Zocor [Simvastatin]     Myalgia  . Erythromycin Rash    Z-PAC is ok   Objective:   Filed Vitals:   11/06/15 1517  BP: 112/60  Pulse: 68  Temp: 97.8 F (36.6 C)  Resp: 16   Physical Exam  Constitutional: He is well-developed, well-nourished, and in no distress.  HENT:  Head: Atraumatic.  Right  Ear: Tympanic membrane and ear canal normal.  Left Ear: Tympanic membrane and ear canal normal.  Nose: Mucosal edema present. No rhinorrhea. No epistaxis.  Mouth/Throat: Oropharynx is clear and moist and mucous membranes are normal. No oropharyngeal exudate, posterior oropharyngeal edema or posterior oropharyngeal erythema.  Eyes: Conjunctivae are normal.  Neck: Neck supple.  Cardiovascular: Normal rate, S1 normal and S2 normal.   No murmur heard. Pulmonary/Chest: Effort normal and breath sounds normal. He has no  wheezes. He has no rhonchi. He has no rales.  Post Xopenex/Atrovent neb: continues to be clear without adventious breath sounds.  Symmetric excellent aeration. Patient reports improved.  Lymphadenopathy:    He has no cervical adenopathy.  Skin: Skin is warm and intact. No rash noted. No cyanosis. Nails show no clubbing.   Diagnostics: Spirometry:  FVC 2.86--83%, FEV1 2.47--102%, post bronchodilator improvement 3.01--88%, FEV1 2.74--113%.    Roselyn M. Willa Rough, MD  cc: Georgann Housekeeper, MD

## 2015-11-11 ENCOUNTER — Other Ambulatory Visit: Payer: Self-pay

## 2015-11-11 MED ORDER — AEROCHAMBER PLUS FLO-VU LARGE MISC: 1.0000 | Freq: Once | Status: DC

## 2015-11-18 ENCOUNTER — Other Ambulatory Visit: Payer: Self-pay

## 2015-11-18 DIAGNOSIS — M5136 Other intervertebral disc degeneration, lumbar region: Secondary | ICD-10-CM

## 2015-11-18 MED ORDER — GABAPENTIN 300 MG PO CAPS: 300.0000 mg | ORAL_CAPSULE | Freq: Three times a day (TID) | ORAL | Status: DC

## 2015-11-18 NOTE — Telephone Encounter (Deleted)
Pt called stating you changed his dosing of gabapentin and he's been unable to get a refill due to the change. Please assist.

## 2015-12-02 ENCOUNTER — Ambulatory Visit: Payer: Medicare Other | Admitting: Sports Medicine

## 2015-12-02 ENCOUNTER — Telehealth: Payer: Self-pay

## 2015-12-02 DIAGNOSIS — M5136 Other intervertebral disc degeneration, lumbar region: Secondary | ICD-10-CM

## 2015-12-02 MED ORDER — GABAPENTIN 300 MG PO CAPS: 1200.0000 mg | ORAL_CAPSULE | Freq: Three times a day (TID) | ORAL | Status: DC

## 2015-12-02 NOTE — Telephone Encounter (Signed)
Gabapentin refilled at 4 tabs 3x daily

## 2015-12-09 ENCOUNTER — Ambulatory Visit (INDEPENDENT_AMBULATORY_CARE_PROVIDER_SITE_OTHER): Payer: Medicare Other | Admitting: Sports Medicine

## 2015-12-09 ENCOUNTER — Encounter: Payer: Self-pay | Admitting: Sports Medicine

## 2015-12-09 ENCOUNTER — Ambulatory Visit (INDEPENDENT_AMBULATORY_CARE_PROVIDER_SITE_OTHER): Payer: Medicare Other

## 2015-12-09 VITALS — BP 114/70 | HR 64 | Resp 16 | Wt 169.0 lb

## 2015-12-09 DIAGNOSIS — I10 Essential (primary) hypertension: Secondary | ICD-10-CM

## 2015-12-09 DIAGNOSIS — R0781 Pleurodynia: Secondary | ICD-10-CM

## 2015-12-09 DIAGNOSIS — S46211A Strain of muscle, fascia and tendon of other parts of biceps, right arm, initial encounter: Secondary | ICD-10-CM | POA: Insufficient documentation

## 2015-12-09 DIAGNOSIS — M25511 Pain in right shoulder: Secondary | ICD-10-CM

## 2015-12-09 DIAGNOSIS — R0789 Other chest pain: Secondary | ICD-10-CM | POA: Insufficient documentation

## 2015-12-09 NOTE — Assessment & Plan Note (Signed)
X-rays, physical therapy, symptoms likely represent bicipital tendinitis, injection if no better.

## 2015-12-09 NOTE — Assessment & Plan Note (Signed)
X-rays, physical therapy focused on iontophoresis at the site of pain. Return to see me in one month, injection around the costal cartilage if no better.

## 2015-12-09 NOTE — Progress Notes (Signed)
  Subjective:    CC: follow-up  HPI: Hypertension: Controlled  Right shoulder pain: Localized anteriorly, worse with flexion of the shoulder and during his golf swing. Moderate, persistent without radiation.  Right rib pain: Localized at the right costal margin, worse with palpation but not with deep breathing, no history of trauma.  Past medical history, Surgical history, Family history not pertinant except as noted below, Social history, Allergies, and medications have been entered into the medical record, reviewed, and no changes needed.   Review of Systems: No fevers, chills, night sweats, weight loss, chest pain, or shortness of breath.   Objective:    General: Well Developed, well nourished, and in no acute distress.  Neuro: Alert and oriented x3, extra-ocular muscles intact, sensation grossly intact.  HEENT: Normocephalic, atraumatic, pupils equal round reactive to light, neck supple, no masses, no lymphadenopathy, thyroid nonpalpable.  Skin: Warm and dry, no rashes. Cardiac: Regular rate and rhythm, no murmurs rubs or gallops, no lower extremity edema.  Respiratory: Clear to auscultation bilaterally. Not using accessory muscles, speaking in full sentences. Right Shoulder: Inspection reveals no abnormalities, atrophy or asymmetry. Palpation is normal with no tenderness over AC joint or bicipital groove. ROM is full in all planes. Rotator cuff strength normal throughout. No signs of impingement with negative Neer and Hawkin's tests, empty can. Positive speeds and Yergason tests No labral pathology noted with negative Obrien's, negative crank, negative clunk, and good stability. Normal scapular function observed. No painful arc and no drop arm sign. No apprehension sign Pain at the right costal margin  Impression and Recommendations:   I spent 25 minutes with this patient, greater than 50% was face-to-face time counseling regarding the above diagnoses

## 2015-12-09 NOTE — Assessment & Plan Note (Signed)
Well controlled, no changes 

## 2016-01-08 ENCOUNTER — Encounter: Payer: Self-pay | Admitting: Sports Medicine

## 2016-01-08 ENCOUNTER — Ambulatory Visit (INDEPENDENT_AMBULATORY_CARE_PROVIDER_SITE_OTHER): Payer: Medicare Other | Admitting: Sports Medicine

## 2016-01-08 VITALS — BP 129/62 | HR 51 | Wt 162.0 lb

## 2016-01-08 DIAGNOSIS — R0789 Other chest pain: Secondary | ICD-10-CM

## 2016-01-08 DIAGNOSIS — I10 Essential (primary) hypertension: Secondary | ICD-10-CM

## 2016-01-08 DIAGNOSIS — M7521 Bicipital tendinitis, right shoulder: Secondary | ICD-10-CM | POA: Diagnosis not present

## 2016-01-08 DIAGNOSIS — R0781 Pleurodynia: Secondary | ICD-10-CM | POA: Diagnosis not present

## 2016-01-08 DIAGNOSIS — M25511 Pain in right shoulder: Secondary | ICD-10-CM

## 2016-01-08 NOTE — Assessment & Plan Note (Signed)
Clinically represents bicipital tendinitis, injected today with complete resolution of pain. Return in one month.

## 2016-01-08 NOTE — Assessment & Plan Note (Signed)
Stable on Diovan, further management per PCP.

## 2016-01-08 NOTE — Assessment & Plan Note (Signed)
Improving slowly but did not actually follow through with physical therapy and iontophoresis recommendations.

## 2016-01-08 NOTE — Progress Notes (Signed)
  Subjective:    CC: Follow-up  HPI: Right shoulder pain: Clinically resembles bicipital tendinitis, has not had physical therapy, desires injection today.  Right rib pain: Slightly improved, also has not had physical therapy or iontophoresis. I have recommended that he follow through with this.  Hypertension: Stable. Further management per PCP.  Past medical history, Surgical history, Family history not pertinant except as noted below, Social history, Allergies, and medications have been entered into the medical record, reviewed, and no changes needed.   Review of Systems: No fevers, chills, night sweats, weight loss, chest pain, or shortness of breath.   Objective:    General: Well Developed, well nourished, and in no acute distress.  Neuro: Alert and oriented x3, extra-ocular muscles intact, sensation grossly intact.  HEENT: Normocephalic, atraumatic, pupils equal round reactive to light, neck supple, no masses, no lymphadenopathy, thyroid nonpalpable.  Skin: Warm and dry, no rashes. Cardiac: Regular rate and rhythm, no murmurs rubs or gallops, no lower extremity edema.  Respiratory: Clear to auscultation bilaterally. Not using accessory muscles, speaking in full sentences. Right Shoulder: Inspection reveals no abnormalities, atrophy or asymmetry. Tender to palpation over the bicipital groove ROM is full in all planes. Rotator cuff strength normal throughout. No signs of impingement with negative Neer and Hawkin's tests, empty can. Speeds and Yergason's tests positive. No labral pathology noted with negative Obrien's, negative crank, negative clunk, and good stability. Normal scapular function observed. No painful arc and no drop arm sign. No apprehension sign  Procedure: Real-time Ultrasound Guided Injection of right bicipital tendon sheath Device: GE Logiq E  Verbal informed consent obtained.  Time-out conducted.  Noted no overlying erythema, induration, or other signs of  local infection.  Skin prepped in a sterile fashion.  Local anesthesia: Topical Ethyl chloride.  With sterile technique and under real time ultrasound guidance:  25-gauge needle advanced into the tendon sheath and 1 mL lidocaine, 1 mL Marcaine, 1 mL kenalog 40 injected easily Completed without difficulty  Pain immediately resolved suggesting accurate placement of the medication.  Advised to call if fevers/chills, erythema, induration, drainage, or persistent bleeding.  Images permanently stored and available for review in the ultrasound unit.  Impression: Technically successful ultrasound guided injection.  Impression and Recommendations:    I spent 25 minutes with this patient, greater than 50% was face-to-face time counseling regarding the above diagnoses, this time was separate from the time spent performing the injection.

## 2016-01-09 ENCOUNTER — Other Ambulatory Visit: Payer: Self-pay

## 2016-01-09 DIAGNOSIS — M5136 Other intervertebral disc degeneration, lumbar region: Secondary | ICD-10-CM

## 2016-01-09 MED ORDER — GABAPENTIN 300 MG PO CAPS: 1200.0000 mg | ORAL_CAPSULE | Freq: Three times a day (TID) | ORAL | Status: DC

## 2016-01-09 NOTE — Progress Notes (Unsigned)
Received a 90 day supply request from [facility].

## 2016-01-15 ENCOUNTER — Ambulatory Visit (INDEPENDENT_AMBULATORY_CARE_PROVIDER_SITE_OTHER): Payer: Medicare Other | Admitting: Physical Therapy

## 2016-01-15 DIAGNOSIS — M546 Pain in thoracic spine: Secondary | ICD-10-CM | POA: Diagnosis not present

## 2016-01-15 NOTE — Patient Instructions (Signed)
Trigger Point Dry Needling  . What is Trigger Point Dry Needling (DN)? o DN is a physical therapy technique used to treat muscle pain and dysfunction. Specifically, DN helps deactivate muscle trigger points (muscle knots).  o A thin filiform needle is used to penetrate the skin and stimulate the underlying trigger point. The goal is for a local twitch response (LTR) to occur and for the trigger point to relax. No medication of any kind is injected during the procedure.   . What Does Trigger Point Dry Needling Feel Like?  o The procedure feels different for each individual patient. Some patients report that they do not actually feel the needle enter the skin and overall the process is not painful. Very mild bleeding may occur. However, many patients feel a deep cramping in the muscle in which the needle was inserted. This is the local twitch response.   . How Will I feel after the treatment? o Soreness is normal, and the onset of soreness may not occur for a few hours. Typically this soreness does not last longer than two days.  o Bruising is uncommon, however; ice can be used to decrease any possible bruising.  o In rare cases feeling tired or nauseous after the treatment is normal. In addition, your symptoms may get worse before they get better, this period will typically not last longer than 24 hours.   . What Can I do After My Treatment? o Increase your hydration by drinking more water for the next 24 hours. o You may place ice or heat on the areas treated that have become sore, however, do not use heat on inflamed or bruised areas. Heat often brings more relief post needling. o You can continue your regular activities, but vigorous activity is not recommended initially after the treatment for 24 hours. o DN is best combined with other physical therapy such as strengthening, stretching, and other therapies.  IONTOPHORESIS PATIENT PRECAUTIONS & CONTRAINDICATIONS:  . Redness under one or both  electrodes can occur.  This characterized by a uniform redness that usually disappears within 12 hours of treatment. . Small pinhead size blisters may result in response to the drug.  Contact your physician if the problem persists more than 24 hours. . On rare occasions, iontophoresis therapy can result in temporary skin reactions such as rash, inflammation, irritation or burns.  The skin reactions may be the result of individual sensitivity to the ionic solution used, the condition of the skin at the start of treatment, reaction to the materials in the electrodes, allergies or sensitivity to dexamethasone, or a poor connection between the patch and your skin.  Discontinue using iontophoresis if you have any of these reactions and report to your therapist. . Remove the Patch or electrodes if you have any undue sensation of pain or burning during the treatment and report discomfort to your therapist. . Tell your Therapist if you have had known adverse reactions to the application of electrical current. . If using the Patch, the LED light will turn off when treatment is complete and the patch can be removed.  Approximate treatment time is 1-3 hours.  Remove the patch when light goes off or after 6 hours. . The Patch can be worn during normal activity, however excessive motion where the electrodes have been placed can cause poor contact between the skin and the electrode or uneven electrical current resulting in greater risk of skin irritation. . Keep out of the reach of children.   . DO   NOT use if you have a cardiac pacemaker or any other electrically sensitive implanted device. . DO NOT use if you have a known sensitivity to dexamethasone. . DO NOT use during Magnetic Resonance Imaging (MRI). . DO NOT use over broken or compromised skin (e.g. sunburn, cuts, or acne) due to the increased risk of skin reaction. . DO NOT SHAVE over the area to be treated:  To establish good contact between the Patch  and the skin, excessive hair may be clipped. . DO NOT place the Patch or electrodes on or over your eyes, directly over your heart, or brain. . DO NOT reuse the Patch or electrodes as this may cause burns to occur.  

## 2016-01-15 NOTE — Therapy (Signed)
[facility] [place] [address] [place] [address] [place], [place], [address] Phone: [phone]   Fax:  [phone]  Physical Therapy Evaluation  Patient Details  Name: Alejandro Hicks MRN: [id number] Date of Birth: 1933/10/20 Referring Provider: Dr Benjamin Stain  Encounter Date: 01/15/2016      PT End of Session - 01/15/16 1406    Visit Number 1   Number of Visits 8   Date for PT Re-Evaluation 02/12/16   PT Start Time 1407   PT Stop Time 1503   PT Time Calculation (min) 56 min   Activity Tolerance Patient tolerated treatment well      Past Medical History  Diagnosis Date  . GERD (gastroesophageal reflux disease)   . Allergic rhinitis   . OA (osteoarthritis)   . Anxiety   . Leukoplakia of vocal cords   . BPH (benign prostatic hypertrophy)   . ED (erectile dysfunction)   . OSA (obstructive sleep apnea)     Does not use CPAP  . Dyslipidemia   . Asthma   . Inguinal hernia     Left S/P repair in 2012  . Colonic polyp   . Reflux laryngitis     ENT ecal, PPIs twice a day  . Rosacea     Past Surgical History  Procedure Laterality Date  . Colonoscopy  2009  . Inguinal hernia repair Left     2012    There were no vitals filed for this visit.       Subjective Assessment - 01/15/16 1410    Subjective Pt has a h/o neck, Rt shoulder and Rt rib pain.  He had PT 5 months ago for his neck and did well.  Recently he had an injection in the Rt shoulder and has been referred to PT to look at his ribs provide treatment to include iontophoresis.  The Rt shoulder is not bothering him anymore.    Pertinent History had a h/o low back pain that flared up when he played golf the other day, it settled down for now. Uses inversion table and performs trunk rotation and will have pain .    How long can you sit comfortably? no limits   How long can you walk comfortably? no limits   Diagnostic tests x-rays (-) for rib fx   Patient Stated Goals get rid  of pain and find out what is causing.    Currently in Pain? No/denies  has pain with thoracic rotation Lt             [facility] PT Assessment - 01/15/16 0001    Assessment   Medical Diagnosis Rt rib pain   Referring Provider Dr Benjamin Stain   Onset Date/Surgical Date 08/17/15   Hand Dominance Right   Next MD Visit not scheduled   Prior Therapy yes   Precautions   Precautions None   Balance Screen   Has the patient fallen in the past 6 months No   Has the patient had a decrease in activity level because of a fear of falling?  No   Is the patient reluctant to leave their home because of a fear of falling?  No   Prior Function   Level of Independence Independent   Vocation Retired   Leisure play golf, play pool, music, handle rental properties.    Posture/Postural Control   Posture/Postural Control Postural limitations   Postural Limitations Forward head;Rounded Shoulders;Increased thoracic kyphosis   ROM / Strength   AROM / PROM / Strength  AROM;Strength   AROM   AROM Assessment Site Shoulder;Thoracic;Lumbar   Lumbar Flexion WNL   Lumbar Extension decreased 50%   Lumbar - Right Side Bend WNL   Lumbar - Left Side Bend WNL with pain Rt side   Lumbar - Right Rotation WNL   Lumbar - Left Rotation WNL with pain   Thoracic Flexion WNl   Thoracic Extension WNL   Thoracic - Right Side Bend WNL   Thoracic - Left Side Bend WNL - pain Rt side   Thoracic - Right Rotation WNL   Thoracic - Left Rotation WNL pain Rt side.    Strength   Strength Assessment Site Shoulder;Thoracic   Right/Left Shoulder --  bilat WNL   Thoracic Flexion --  WNl   Thoracic Extension --  WNL   Palpation   Palpation comment point tender in proximal Rt QL                   [facility] Adult PT Treatment/Exercise - 01/15/16 0001    Modalities   Modalities Electrical Stimulation;Iontophoresis;Moist Heat   Moist Heat Therapy   Number Minutes Moist Heat 15 Minutes   Moist Heat Location Lumbar Spine   Rt in s/l   Electrical Stimulation   Electrical Stimulation Location Rt side lumbar   Electrical Stimulation Action IFC   Electrical Stimulation Parameters to tolerance   Electrical Stimulation Goals Pain;Tone   Iontophoresis   Type of Iontophoresis Dexamethasone   Location Rt superior QL   Dose 1.0cc   Time 8 hr patch          Trigger Point Dry Needling - 01/15/16 1702    Consent Given? Yes   Education Handout Provided No  verbally explained.    Muscles Treated Upper Body Quadratus Lumborum  Rt with good twitch response              PT Education - 01/15/16 1435    Education provided Yes   Education Details TDN & ionto   Person(s) Educated Patient   Methods Explanation   Comprehension Verbalized understanding             PT Long Term Goals - 01/15/16 1707    PT LONG TERM GOAL #1   Title I with QL stretching ( 02/12/16)    Time 4   Period Weeks   Status New   PT LONG TERM GOAL #2   Title perform lumbar and thoracic ROM without reports of pain ( 02/12/16)    Time 4   Period Weeks   Status New   PT LONG TERM GOAL #3   Title play golf without pain ( 02/12/16)    Time 4   Period Weeks   Status New   PT LONG TERM GOAL #4   Title improve FOTO =/< 20% limited, CI level ( 02/12/16)    Time 4   Period Weeks   Status New               Plan - 01/15/16 1705    Clinical Impression Statement 82 yo male presents with point tenderness in his Rt QL that flares up with golf and trigger point pressure.     Rehab Potential Excellent   PT Frequency 2x / week   PT Duration 4 weeks   PT Treatment/Interventions Ultrasound;Iontophoresis /ml Dexamethasone;Moist Heat;Therapeutic exercise;Manual techniques;Dry needling;Cryotherapy;Electrical Stimulation;Patient/family education   PT Next Visit Plan assess need for more TDN, QL stretches.    Consulted and Agree with Plan of Care  Patient      Patient will benefit from skilled therapeutic intervention in order  to improve the following deficits and impairments:  Pain, Decreased range of motion  Visit Diagnosis: Pain in thoracic spine - Plan: PT plan of care cert/re-cert     Problem List Patient Active Problem List   Diagnosis Date Noted  . Rib pain on right side 12/09/2015  . Right shoulder pain 12/09/2015  . Essential hypertension, benign 10/21/2015  . Lumbar degenerative disc disease 09/02/2015  . Right cervical radiculopathy 08/05/2015    Roderic ScarceSusan Nimesh Riolo PT  01/15/2016, 5:11 PM  [facility] [address] [place], [place], [address] Phone: [phone]   Fax:  [phone]  Name: Judithann SheenBobby W Adeyemi MRN: [id number] Date of Birth: 08-23-1934

## 2016-01-17 ENCOUNTER — Ambulatory Visit: Payer: Medicare Other | Admitting: Rehabilitative and Restorative Service Providers"

## 2016-01-21 ENCOUNTER — Ambulatory Visit (INDEPENDENT_AMBULATORY_CARE_PROVIDER_SITE_OTHER): Payer: Medicare Other | Admitting: Physical Therapy

## 2016-01-21 DIAGNOSIS — M542 Cervicalgia: Secondary | ICD-10-CM

## 2016-01-21 DIAGNOSIS — R531 Weakness: Secondary | ICD-10-CM

## 2016-01-21 DIAGNOSIS — R6889 Other general symptoms and signs: Secondary | ICD-10-CM

## 2016-01-21 DIAGNOSIS — M546 Pain in thoracic spine: Secondary | ICD-10-CM | POA: Diagnosis not present

## 2016-01-21 DIAGNOSIS — M623 Immobility syndrome (paraplegic): Secondary | ICD-10-CM

## 2016-01-21 DIAGNOSIS — M2560 Stiffness of unspecified joint, not elsewhere classified: Secondary | ICD-10-CM

## 2016-01-21 DIAGNOSIS — Z7409 Other reduced mobility: Secondary | ICD-10-CM | POA: Diagnosis not present

## 2016-01-21 NOTE — Patient Instructions (Signed)
Thoracic Side-Bend (Standing)    With hands interlocked behind head, tip right elbow up, other elbow down. Do not allow body to lean. Hold __15__ seconds. Relax. Repeat _3___ times per set. Do _1___ sets per session. Do _2___ sessions per day.  http://orth.exer.us/1010    [facility] [address] [place], [place] [address]  [phone] (office) [phone] (fax)

## 2016-01-21 NOTE — Therapy (Signed)
[facility] [address] [place] [address] [place], [place], [address] Phone: [phone]   Fax:  [phone]  Physical Therapy Treatment  Patient Details  Name: Alejandro SheenBobby W Bourassa MRN: [id number] Date of Birth: [birth year] Referring Provider: Dr Benjamin Stainhekkekandam  Encounter Date: 01/21/2016      PT End of Session - 01/21/16 0936    Visit Number 2   Number of Visits 8   Date for PT Re-Evaluation 02/12/16   PT Start Time 0936  pt arrived late   PT Stop Time 1014   PT Time Calculation (min) 38 min      Past Medical History  Diagnosis Date  . GERD (gastroesophageal reflux disease)   . Allergic rhinitis   . OA (osteoarthritis)   . Anxiety   . Leukoplakia of vocal cords   . BPH (benign prostatic hypertrophy)   . ED (erectile dysfunction)   . OSA (obstructive sleep apnea)     Does not use CPAP  . Dyslipidemia   . Asthma   . Inguinal hernia     Left S/P repair in 2012  . Colonic polyp   . Reflux laryngitis     ENT ecal, PPIs twice a day  . Rosacea     Past Surgical History  Procedure Laterality Date  . Colonoscopy  2009  . Inguinal hernia repair Left     2012    There were no vitals filed for this visit.      Subjective Assessment - 01/21/16 0939    Subjective Pt reports he feels the TDN and ionto patch helped some.  Able to make golf swing without pain, but afterwards    Currently in Pain? No/denies  no pain at rest, 4/10 with movement.             [facility] Assessment - 01/21/16 0001    Assessment   Medical Diagnosis Rt rib pain   Onset Date/Surgical Date 08/17/15   Hand Dominance Right   Next MD Visit not scheduled           [facility] Adult PT Treatment/Exercise - 01/21/16 0001    Exercises   Exercises Lumbar   Lumbar Exercises: Aerobic   Stationary Bike NuStep (arms and legs) L4: 5min    Lumbar Exercises: Standing   Other Standing Lumbar Exercises Standing, modified childs pose (hands on chest level shelf x 3  reps    Other Standing Lumbar Exercises Standing Rt row with trrunk twist wiht 3# in Rt hand x 10    Lumbar Exercises: Seated   Other Seated Lumbar Exercises side bend with hands at head x 15 sec x 3 reps    Lumbar Exercises: Sidelying   Other Sidelying Lumbar Exercises thoracic rotation with green band x 20 reps each side.    Modalities   Modalities Ultrasound;Iontophoresis   Ultrasound   Ultrasound Location Rt side (at lowest rib)    Ultrasound Parameters 8 min, 100%, 3.3 w/cm2, 1.0 w/cm2    Ultrasound Goals Pain   Iontophoresis   Type of Iontophoresis Dexamethasone   Location Rt caudal QL   Dose 1.0cc, 80 mA   Time 6 hr STAT patch   Manual Therapy   Manual Therapy Soft tissue mobilization   Manual therapy comments Pt in Lt side lying   Soft tissue mobilization to Rt QL, paraspinals.                      PT Long Term Goals - 01/15/16  1707    PT LONG TERM GOAL #1   Title I with QL stretching ( 02/12/16)    Time 4   Period Weeks   Status New   PT LONG TERM GOAL #2   Title perform lumbar and thoracic ROM without reports of pain ( 02/12/16)    Time 4   Period Weeks   Status New   PT LONG TERM GOAL #3   Title play golf without pain ( 02/12/16)    Time 4   Period Weeks   Status New   PT LONG TERM GOAL #4   Title improve FOTO =/< 20% limited, CI level ( 02/12/16)    Time 4   Period Weeks   Status New               Plan - 01/21/16 1316    Clinical Impression Statement Pt had positive response to TDN and ionto patch last session.  Pt tolerated all exercises well, without increase in pain.    Rehab Potential Excellent   PT Frequency 2x / week   PT Duration 4 weeks   PT Treatment/Interventions Ultrasound;Iontophoresis /ml Dexamethasone;Moist Heat;Therapeutic exercise;Manual techniques;Dry needling;Cryotherapy;Electrical Stimulation;Patient/family education   PT Next Visit Plan Assess response to new HEP and US/Ionto    Consulted and Agree with Plan of  Care Patient      Patient will benefit from skilled therapeutic intervention in order to improve the following deficits and impairments:  Pain, Decreased range of motion  Visit Diagnosis: Pain in thoracic spine  Stiffness due to immobility  Pain of cervical spine  Decreased strength, endurance, and mobility     Problem List Patient Active Problem List   Diagnosis Date Noted  . Rib pain on right side 12/09/2015  . Right shoulder pain 12/09/2015  . Essential hypertension, benign 10/21/2015  . Lumbar degenerative disc disease 09/02/2015  . Right cervical radiculopathy 08/05/2015   Mayer Camel, PTA 01/21/2016 1:17 PM  [facility] [place] [address] [place] [address] [place], [place], [address] Phone: [phone]   Fax:  [phone]  Name: Alejandro Hicks MRN: [id number] Date of Birth: 05-Dec-1933

## 2016-01-23 ENCOUNTER — Ambulatory Visit (INDEPENDENT_AMBULATORY_CARE_PROVIDER_SITE_OTHER): Payer: Medicare Other | Admitting: Physical Therapy

## 2016-01-23 DIAGNOSIS — M2560 Stiffness of unspecified joint, not elsewhere classified: Secondary | ICD-10-CM

## 2016-01-23 DIAGNOSIS — M546 Pain in thoracic spine: Secondary | ICD-10-CM

## 2016-01-23 DIAGNOSIS — M623 Immobility syndrome (paraplegic): Secondary | ICD-10-CM | POA: Diagnosis not present

## 2016-01-23 DIAGNOSIS — Z7409 Other reduced mobility: Secondary | ICD-10-CM

## 2016-01-23 NOTE — Therapy (Addendum)
[facility] [place], [place], [address] Phone: [phone]   Fax:  [phone]  Physical Therapy Treatment  Patient Details  Name: Alejandro Hicks MRN: [id number] Date of Birth: 02-23-1934 Referring Provider: Dr Dianah Field  Encounter Date: 01/23/2016      PT End of Session - 01/23/16 0940    Visit Number 3   Number of Visits 8   Date for PT Re-Evaluation 02/12/16   PT Start Time 0935   PT Stop Time 1025   PT Time Calculation (min) 50 min   Activity Tolerance Patient tolerated treatment well;No increased pain      Past Medical History  Diagnosis Date  . GERD (gastroesophageal reflux disease)   . Allergic rhinitis   . OA (osteoarthritis)   . Anxiety   . Leukoplakia of vocal cords   . BPH (benign prostatic hypertrophy)   . ED (erectile dysfunction)   . OSA (obstructive sleep apnea)     Does not use CPAP  . Dyslipidemia   . Asthma   . Inguinal hernia     Left S/P repair in 2012  . Colonic polyp   . Reflux laryngitis     ENT ecal, PPIs twice a day  . Rosacea     Past Surgical History  Procedure Laterality Date  . Colonoscopy  2009  . Inguinal hernia repair Left     2012    There were no vitals filed for this visit.      Subjective Assessment - 01/23/16 0940    Subjective Pt reports his rib continues to be tender to touch and sore with side bending Lt.  Pt reports he hasn't had time to perform HEP, been busy.     Currently in Pain? No/denies  0/10 at rest, 6/56 with certain movement, 8/12 with palpation            [facility] PT Assessment - 01/23/16 0001    Assessment   Medical Diagnosis Rt rib pain   Onset Date/Surgical Date 08/17/15   Hand Dominance Right   Next MD Visit not scheduled   Palpation   Palpation comment point tender in proximal Rt QL           [facility] Adult PT Treatment/Exercise - 01/23/16 0001    Lumbar Exercises: Standing   Other Standing Lumbar Exercises  Standing, modified childs pose (hands on chest level shelf x 3 reps    Other Standing Lumbar Exercises Wood chop with green band x 10 reps each side, standing side bend stretch x 5 reps each direction (pt reported decreased pain after few repetitions)    Lumbar Exercises: Seated   Other Seated Lumbar Exercises side bend with hands at head x 15 sec x 3 reps    Lumbar Exercises: Sidelying   Other Sidelying Lumbar Exercises thoracic rotation with green band x 20 reps each side.    Moist Heat Therapy   Number Minutes Moist Heat 15 Minutes   Moist Heat Location Lumbar Spine   Electrical Stimulation   Electrical Stimulation Location Rt lower rib area    Electrical Stimulation Action IFC   Electrical Stimulation Parameters to tolerance    Electrical Stimulation Goals Tone;Pain   Iontophoresis   Type of Iontophoresis Dexamethasone   Location Rt lower lateral rib    Dose 1.0cc, 80 mA   Time 6 hr STAT patch   Manual Therapy   Manual Therapy Soft tissue mobilization   Manual therapy comments Pt in hooklying  Soft tissue mobilization TPR and cross fiber to Rt QL, paraspinals, transverse abdominus .            PT Long Term Goals - 01/23/16 1012    PT LONG TERM GOAL #1   Title I with QL stretching ( 02/12/16)    Time 4   Period Weeks   Status On-going   PT LONG TERM GOAL #2   Title perform lumbar and thoracic ROM without reports of pain ( 02/12/16)    Time 4   Period Weeks   Status On-going   PT LONG TERM GOAL #3   Title play golf without pain ( 02/12/16)    Time 4   Period Weeks   Status Partially Met   PT LONG TERM GOAL #4   Title improve FOTO =/< 20% limited, CI level ( 02/12/16)    Time 4   Period Weeks   Status On-going               Plan - 01/23/16 1012    Clinical Impression Statement Pt reported slight reduction in tenderness in Rt lateral trunk after manual therapy.  Pt tolerated all exercises well, without increase in pain/symptoms.  Pt had reduced pain during  last golf session, partially meeting LTG # 3.    Rehab Potential Excellent   PT Frequency 2x / week   PT Duration 4 weeks   PT Treatment/Interventions Ultrasound;Iontophoresis 34m/ml Dexamethasone;Moist Heat;Therapeutic exercise;Manual techniques;Dry needling;Cryotherapy;Electrical Stimulation;Patient/family education   PT Next Visit Plan Continue stretching through Rt QL.  Manual therapy and modalities as indicated.    Consulted and Agree with Plan of Care Patient      Patient will benefit from skilled therapeutic intervention in order to improve the following deficits and impairments:  Pain, Decreased range of motion  Visit Diagnosis: Pain in thoracic spine  Stiffness due to immobility     Problem List Patient Active Problem List   Diagnosis Date Noted  . Rib pain on right side 12/09/2015  . Right shoulder pain 12/09/2015  . Essential hypertension, benign 10/21/2015  . Lumbar degenerative disc disease 09/02/2015  . Right cervical radiculopathy 08/05/2015   JKerin Perna PTA 01/23/2016 10:16 AM  [place] [address]Phone: [phone]  Fax:  [phone] Name: BDURWIN DAVISSONMRN: [id number] of Birth: [birth year]

## 2016-01-28 ENCOUNTER — Encounter: Payer: Self-pay | Admitting: Physical Therapy

## 2016-01-28 ENCOUNTER — Ambulatory Visit (INDEPENDENT_AMBULATORY_CARE_PROVIDER_SITE_OTHER): Payer: Medicare Other | Admitting: Physical Therapy

## 2016-01-28 DIAGNOSIS — M623 Immobility syndrome (paraplegic): Secondary | ICD-10-CM

## 2016-01-28 DIAGNOSIS — M546 Pain in thoracic spine: Secondary | ICD-10-CM | POA: Diagnosis not present

## 2016-01-28 DIAGNOSIS — Z7409 Other reduced mobility: Secondary | ICD-10-CM

## 2016-01-28 DIAGNOSIS — M2560 Stiffness of unspecified joint, not elsewhere classified: Secondary | ICD-10-CM

## 2016-01-28 NOTE — Therapy (Signed)
[facility] [place] [address] [place], [place], [address] Phone: [phone]   Fax:  [phone]  Physical Therapy Treatment  Patient Details  Name: Alejandro Hicks MRN: [id number] Date of Birth: 1934/08/08 Referring Provider: Dr Benjamin Stain  Encounter Date: 01/28/2016      PT End of Session - 01/28/16 1022    Visit Number 4   Number of Visits 8   Date for PT Re-Evaluation 02/12/16   PT Start Time 1022   PT Stop Time 1110   PT Time Calculation (min) 48 min      Past Medical History  Diagnosis Date  . GERD (gastroesophageal reflux disease)   . Allergic rhinitis   . OA (osteoarthritis)   . Anxiety   . Leukoplakia of vocal cords   . BPH (benign prostatic hypertrophy)   . ED (erectile dysfunction)   . OSA (obstructive sleep apnea)     Does not use CPAP  . Dyslipidemia   . Asthma   . Inguinal hernia     Left S/P repair in 2012  . Colonic polyp   . Reflux laryngitis     ENT ecal, PPIs twice a day  . Rosacea     Past Surgical History  Procedure Laterality Date  . Colonoscopy  2009  . Inguinal hernia repair Left     2012    There were no vitals filed for this visit.      Subjective Assessment - 01/28/16 1025    Subjective Alejandro Hicks reports he is still having intermittent Rt rib/side pain, the intensity is the same.    Patient Stated Goals get rid of pain and find out what is causing.    Currently in Pain? No/denies  only with pressure to the area.             [facility] PT Assessment - 01/28/16 0001    Assessment   Medical Diagnosis Rt rib pain   Referring Provider Dr Benjamin Stain   Onset Date/Surgical Date 08/17/15   Hand Dominance Right   AROM   Lumbar Extension WNL no pain   Lumbar - Left Side Bend WNL no pain    Lumbar - Left Rotation WNL no pain.    Thoracic - Left Side Bend WNL no pain   Thoracic - Left Rotation WNL no pain                     [facility] Adult PT Treatment/Exercise -  01/28/16 0001    Lumbar Exercises: Aerobic   Stationary Bike Nustep L6x5   Lumbar Exercises: Supine   Other Supine Lumbar Exercises leg lengtheners x 10 each side.    Lumbar Exercises: Sidelying   Other Sidelying Lumbar Exercises lat work in hooklying, 4# wts overhead pull.    Other Sidelying Lumbar Exercises 5x5sec modified side planks   Modalities   Modalities Moist Heat;Iontophoresis   Moist Heat Therapy   Number Minutes Moist Heat 15 Minutes   Moist Heat Location Lumbar Spine   Electrical Stimulation   Electrical Stimulation Location --   Electrical Stimulation Action --   Electrical Stimulation Parameters --   Electrical Stimulation Goals --   Iontophoresis   Type of Iontophoresis Dexamethasone   Location Rt lower lateral rib    Dose 1.0cc, 80 mA   Time 6 hr STAT patch   Manual Therapy   Manual Therapy Soft tissue mobilization   Manual therapy comments Rt lower rib springing without pain.    Soft  tissue mobilization Rt QL and lumbar para spinals.           Trigger Point Dry Needling - 01/28/16 1048    Consent Given? Yes   Education Handout Provided No   Muscles Treated Upper Body Quadratus Lumborum;Longissimus  Rt    Longissimus Response Twitch response elicited;Palpable increased muscle length                   PT Long Term Goals - 01/28/16 1027    PT LONG TERM GOAL #1   Title I with QL stretching ( 02/12/16)    Status On-going   PT LONG TERM GOAL #2   Title perform lumbar and thoracic ROM without reports of pain ( 02/12/16)    Status On-going   PT LONG TERM GOAL #3   Title play golf without pain ( 02/12/16)    Status On-going   PT LONG TERM GOAL #4   Title improve FOTO =/< 20% limited, CI level ( 02/12/16)    Status On-going               Plan - 01/28/16 1200    Clinical Impression Statement Alejandro Hicks still has localized pain in the Rt lower rib area.  He did have some responses to TDN into the Rt QL and lumbar paraspinals.  He didn't have  any pain with lumbar and thoracic ROM after treatment.    Rehab Potential Excellent   PT Frequency 2x / week   PT Duration 4 weeks   PT Treatment/Interventions Ultrasound;Iontophoresis 4mg /ml Dexamethasone;Moist Heat;Therapeutic exercise;Manual techniques;Dry needling;Cryotherapy;Electrical Stimulation;Patient/family education   PT Next Visit Plan re-assess for MD visit   Consulted and Agree with Plan of Care Patient      Patient will benefit from skilled therapeutic intervention in order to improve the following deficits and impairments:  Pain, Decreased range of motion  Visit Diagnosis: Pain in thoracic spine  Stiffness due to immobility     Problem List Patient Active Problem List   Diagnosis Date Noted  . Rib pain on right side 12/09/2015  . Right shoulder pain 12/09/2015  . Essential hypertension, benign 10/21/2015  . Lumbar degenerative disc disease 09/02/2015  . Right cervical radiculopathy 08/05/2015    Roderic ScarceSusan Shaver PT  01/28/2016, 12:02 PM  [facility] [address] [place], [place], [address] Phone: [phone]   Fax:  [phone]  Name: Alejandro Hicks MRN: [id number] Date of Birth: Dec 06, 1933

## 2016-01-30 ENCOUNTER — Other Ambulatory Visit: Payer: Self-pay | Admitting: Family Medicine

## 2016-02-04 ENCOUNTER — Ambulatory Visit (INDEPENDENT_AMBULATORY_CARE_PROVIDER_SITE_OTHER): Payer: Medicare Other | Admitting: Physical Therapy

## 2016-02-04 DIAGNOSIS — M546 Pain in thoracic spine: Secondary | ICD-10-CM | POA: Diagnosis not present

## 2016-02-04 DIAGNOSIS — M623 Immobility syndrome (paraplegic): Secondary | ICD-10-CM

## 2016-02-04 DIAGNOSIS — M2560 Stiffness of unspecified joint, not elsewhere classified: Secondary | ICD-10-CM

## 2016-02-04 DIAGNOSIS — Z7409 Other reduced mobility: Secondary | ICD-10-CM

## 2016-02-04 NOTE — Therapy (Signed)
[place], [place], [address] Phone: [phone]   Fax:  [phone]  Physical Therapy Treatment  Patient Details  Name: Alejandro Hicks MRN: [id number] Date of Birth: [birth year] Referring Provider: Dr Dianah Field  Encounter Date: 02/04/2016      PT End of Session - 02/04/16 1019    Visit Number 5   Number of Visits 8   Date for PT Re-Evaluation 02/12/16   PT Start Time 1019   PT Stop Time 1045   PT Time Calculation (min) 26 min   Activity Tolerance Patient tolerated treatment well      Past Medical History  Diagnosis Date  . GERD (gastroesophageal reflux disease)   . Allergic rhinitis   . OA (osteoarthritis)   . Anxiety   . Leukoplakia of vocal cords   . BPH (benign prostatic hypertrophy)   . ED (erectile dysfunction)   . OSA (obstructive sleep apnea)     Does not use CPAP  . Dyslipidemia   . Asthma   . Inguinal hernia     Left S/P repair in 2012  . Colonic polyp   . Reflux laryngitis     ENT ecal, PPIs twice a day  . Rosacea     Past Surgical History  Procedure Laterality Date  . Colonoscopy  2009  . Inguinal hernia repair Left     2012    There were no vitals filed for this visit.      Subjective Assessment - 02/04/16 1021    Subjective Alejandro Hicks states he is doing really well, hasn't had pain in about 5 days.  just barely there when he feels it.    Patient Stated Goals get rid of pain and find out what is causing.    Currently in Pain? No/denies            [facility] PT Assessment - 02/04/16 0001    Assessment   Medical Diagnosis Rt rib pain   Referring Provider Dr Dianah Field   Onset Date/Surgical Date 08/17/15   Hand Dominance Right   Observation/Other Assessments   Focus on Therapeutic Outcomes (FOTO)  1% limited   AROM   Lumbar Flexion WNL   Lumbar Extension WNL   Lumbar - Right Side Bend WNL   Lumbar - Left Side Bend WNL   Lumbar - Right Rotation WNL   Lumbar - Left Rotation WNL   Thoracic Flexion WNL   Thoracic Extension WNL   Thoracic - Right Side Bend WNL   Thoracic - Left Side Bend WNL  1or less/10 pain in Rt lower ribs   Thoracic - Right Rotation WNL   Thoracic - Left Rotation WNL  1orless/10 in Rt lower ribs.    Strength   Strength Assessment Site Shoulder   Right/Left Shoulder --  bilat WNL                     [facility] Adult PT Treatment/Exercise - 02/04/16 0001    Self-Care   Self-Care Other Self-Care Comments   Other Self-Care Comments  discussed  proper warm ups and continuing HEP   Lumbar Exercises: Stretches   Standing Side Bend Limitations other stretch - sink stretch with pelvic rotation 2 x30sec Side bend stretching over yoga mat, bolster and pool noodle   Lumbar Exercises: Aerobic   Stationary Bike Nustep L5x6, UE/LE  PT Long Term Goals - 02/04/16 1022    PT LONG TERM GOAL #1   Title I with QL stretching ( 02/12/16)    Status Achieved   PT LONG TERM GOAL #2   Title perform lumbar and thoracic ROM without reports of pain ( 02/12/16)    Status Achieved  full ROM without pain   PT LONG TERM GOAL #3   Title play golf without pain ( 02/12/16)    Status Achieved   PT LONG TERM GOAL #4   Title improve FOTO =/< 20% limited, CI level ( 02/12/16)    Status Achieved  1% limited               Plan - 02/04/16 1047    Clinical Impression Statement Alejandro Hicks has done very well over the last week, he has minimal to no pain currently, even after palying 36 holes of golf.  All goals met, ready for D/C    PT Next Visit Plan D/C to HEP    Consulted and Agree with Plan of Care Patient      Patient will benefit from skilled therapeutic intervention in order to improve the following deficits and impairments:     Visit Diagnosis: Pain in thoracic spine  Stiffness due to immobility     Problem List Patient Active Problem List   Diagnosis Date Noted  . Rib pain on right  side 12/09/2015  . Right shoulder pain 12/09/2015  . Essential hypertension, benign 10/21/2015  . Lumbar degenerative disc disease 09/02/2015  . Right cervical radiculopathy 08/05/2015    Jeral Pinch PT  02/04/2016, 10:52 AM  [facility] [place], [place], [address] Phone: [phone]   Fax:  [phone]  Name: Alejandro Hicks MRN: [id number] Date of Birth: [birth year]  PHYSICAL THERAPY DISCHARGE SUMMARY  Visits from Start of Care: 5  Current functional level related to goals / functional outcomes: See above   Remaining deficits: none   Education / Equipment: HEP Plan: Patient agrees to discharge.  Patient goals were met. Patient is being discharged due to meeting the stated rehab goals.  ?????    Jeral Pinch, PT 02/04/2016 10:52 AM

## 2016-02-18 ENCOUNTER — Ambulatory Visit (INDEPENDENT_AMBULATORY_CARE_PROVIDER_SITE_OTHER): Payer: Medicare Other | Admitting: Sports Medicine

## 2016-02-18 ENCOUNTER — Encounter: Payer: Self-pay | Admitting: Sports Medicine

## 2016-02-18 VITALS — BP 135/75 | HR 61 | Resp 16 | Wt 160.2 lb

## 2016-02-18 DIAGNOSIS — S46111D Strain of muscle, fascia and tendon of long head of biceps, right arm, subsequent encounter: Secondary | ICD-10-CM | POA: Diagnosis not present

## 2016-02-18 DIAGNOSIS — S46211D Strain of muscle, fascia and tendon of other parts of biceps, right arm, subsequent encounter: Secondary | ICD-10-CM

## 2016-02-18 NOTE — Progress Notes (Signed)
  Subjective:    CC: Right shoulder  HPI: Alejandro Hicks returns, we did a right biceps tendon sheath injection at the last visit, he has since ruptured his biceps tendon, and does have a positive Popeye sign, his pain has now resolved as expected.  Past medical history, Surgical history, Family history not pertinant except as noted below, Social history, Allergies, and medications have been entered into the medical record, reviewed, and no changes needed.   Review of Systems: No fevers, chills, night sweats, weight loss, chest pain, or shortness of breath.   Objective:    General: Well Developed, well nourished, and in no acute distress.  Neuro: Alert and oriented x3, extra-ocular muscles intact, sensation grossly intact.  HEENT: Normocephalic, atraumatic, pupils equal round reactive to light, neck supple, no masses, no lymphadenopathy, thyroid nonpalpable.  Skin: Warm and dry, no rashes. Cardiac: Regular rate and rhythm, no murmurs rubs or gallops, no lower extremity edema.  Respiratory: Clear to auscultation bilaterally. Not using accessory muscles, speaking in full sentences. Right arm: Visible bruising, positive Popeye sign, excellent strength to flexion at the elbow, consistent with proximal biceps tear and retraction.  The arm was strapped with compressive dressing.  Impression and Recommendations:

## 2016-02-18 NOTE — Assessment & Plan Note (Signed)
This is essentially fixed itself. Return as needed.

## 2016-03-23 ENCOUNTER — Other Ambulatory Visit: Payer: Self-pay | Admitting: Allergy and Immunology

## 2016-04-02 ENCOUNTER — Encounter: Payer: Self-pay | Admitting: Sports Medicine

## 2016-04-02 ENCOUNTER — Ambulatory Visit (INDEPENDENT_AMBULATORY_CARE_PROVIDER_SITE_OTHER): Payer: Medicare Other | Admitting: Sports Medicine

## 2016-04-02 VITALS — BP 123/70 | HR 76 | Resp 16 | Wt 165.0 lb

## 2016-04-02 DIAGNOSIS — S46111D Strain of muscle, fascia and tendon of long head of biceps, right arm, subsequent encounter: Secondary | ICD-10-CM

## 2016-04-02 DIAGNOSIS — S46211D Strain of muscle, fascia and tendon of other parts of biceps, right arm, subsequent encounter: Secondary | ICD-10-CM

## 2016-04-02 NOTE — Assessment & Plan Note (Signed)
All bicipital pain resolved after complete rupture with retraction of the right biceps. Today pain is predominantly referable to the glenohumeral joint, glenohumeral injection performed as above, return as needed.

## 2016-04-02 NOTE — Progress Notes (Signed)
  Subjective:    CC: Right shoulder pain  HPI: This is a pleasant 82 year old male, he had a right proximal biceps rupture with retraction and Popeye sign months ago, this resolved all of his bicipital pain, he now has pain that he localizes at the glenohumeral joint, anterior and posterior aspects and desires repeat interventional treatment today. Pain is moderate, persistent without radiation.  Past medical history, Surgical history, Family history not pertinant except as noted below, Social history, Allergies, and medications have been entered into the medical record, reviewed, and no changes needed.   Review of Systems: No fevers, chills, night sweats, weight loss, chest pain, or shortness of breath.   Objective:    General: Well Developed, well nourished, and in no acute distress.  Neuro: Alert and oriented x3, extra-ocular muscles intact, sensation grossly intact.  HEENT: Normocephalic, atraumatic, pupils equal round reactive to light, neck supple, no masses, no lymphadenopathy, thyroid nonpalpable.  Skin: Warm and dry, no rashes. Cardiac: Regular rate and rhythm, no murmurs rubs or gallops, no lower extremity edema.  Respiratory: Clear to auscultation bilaterally. Not using accessory muscles, speaking in full sentences. Right Shoulder: Inspection reveals no abnormalities, atrophy or asymmetry. Visible Popeye sign Palpation is normal with no tenderness over AC joint or bicipital groove. ROM is full in all planes. Rotator cuff strength normal throughout. No signs of impingement with negative Neer and Hawkin's tests, empty can. Speeds and Yergason's tests normal. Positive Obrien's, negative crank, negative clunk, and good stability. Normal scapular function observed. No painful arc and no drop arm sign. No apprehension sign  Procedure: Real-time Ultrasound Guided Injection of right glenohumeral joint Device: GE Logiq E  Verbal informed consent obtained.  Time-out conducted.    Noted no overlying erythema, induration, or other signs of local infection.  Skin prepped in a sterile fashion.  Local anesthesia: Topical Ethyl chloride.  With sterile technique and under real time ultrasound guidance:  22-gauge spinal needle advanced into the joint and 1 mL kenalog 40, 2 mL lidocaine, 2 mL Marcaine injected easily. Completed without difficulty  Pain immediately resolved suggesting accurate placement of the medication.  Advised to call if fevers/chills, erythema, induration, drainage, or persistent bleeding.  Images permanently stored and available for review in the ultrasound unit.  Impression: Technically successful ultrasound guided injection.  Impression and Recommendations:    Right shoulder pain with history of right proximal biceps rupture All bicipital pain resolved after complete rupture with retraction of the right biceps. Today pain is predominantly referable to the glenohumeral joint, glenohumeral injection performed as above, return as needed.

## 2016-04-13 ENCOUNTER — Other Ambulatory Visit: Payer: Self-pay | Admitting: *Deleted

## 2016-04-13 MED ORDER — MONTELUKAST SODIUM 10 MG PO TABS: 10.0000 mg | ORAL_TABLET | Freq: Every day | ORAL | 1 refills | Status: DC

## 2016-05-05 ENCOUNTER — Other Ambulatory Visit: Payer: Self-pay

## 2016-05-05 ENCOUNTER — Other Ambulatory Visit: Payer: Self-pay | Admitting: Sports Medicine

## 2016-05-05 DIAGNOSIS — I10 Essential (primary) hypertension: Secondary | ICD-10-CM

## 2016-05-05 MED ORDER — VALSARTAN-HYDROCHLOROTHIAZIDE 320-25 MG PO TABS: 1.0000 | ORAL_TABLET | Freq: Every day | ORAL | 3 refills | Status: DC

## 2016-06-28 ENCOUNTER — Other Ambulatory Visit: Payer: Self-pay | Admitting: Family Medicine

## 2016-06-28 DIAGNOSIS — M5136 Other intervertebral disc degeneration, lumbar region: Secondary | ICD-10-CM

## 2016-07-10 ENCOUNTER — Ambulatory Visit (INDEPENDENT_AMBULATORY_CARE_PROVIDER_SITE_OTHER): Payer: Medicare Other | Admitting: Sports Medicine

## 2016-07-10 ENCOUNTER — Encounter: Payer: Self-pay | Admitting: Sports Medicine

## 2016-07-10 DIAGNOSIS — S46211D Strain of muscle, fascia and tendon of other parts of biceps, right arm, subsequent encounter: Secondary | ICD-10-CM | POA: Diagnosis not present

## 2016-07-10 NOTE — Assessment & Plan Note (Signed)
Glenohumeral joint injection from approximately 3 months ago was successful, no having a recurrence of pain referable to the glenohumeral joint. Repeat injection today, return as needed.

## 2016-07-10 NOTE — Progress Notes (Signed)
  Subjective:    CC: Right shoulder pain  HPI: This is a pleasant 82 year old male, we injected his right glenohumeral joint about 3 months ago, it was very successful and he is just now having a recurrence of pain. He desires repeat injection today, pain is moderate, persistent, localized of the deltoid as well as at the joint line, worse with overhead activities as well as with external rotation and abduction of the shoulder. No mechanical symptoms, no trauma, no constitutional symptoms.  Past medical history:  Negative.  See flowsheet/record as well for more information.  Surgical history: Negative.  See flowsheet/record as well for more information.  Family history: Negative.  See flowsheet/record as well for more information.  Social history: Negative.  See flowsheet/record as well for more information.  Allergies, and medications have been entered into the medical record, reviewed, and no changes needed.   Review of Systems: No fevers, chills, night sweats, weight loss, chest pain, or shortness of breath.   Objective:    General: Well Developed, well nourished, and in no acute distress.  Neuro: Alert and oriented x3, extra-ocular muscles intact, sensation grossly intact.  HEENT: Normocephalic, atraumatic, pupils equal round reactive to light, neck supple, no masses, no lymphadenopathy, thyroid nonpalpable.  Skin: Warm and dry, no rashes. Cardiac: Regular rate and rhythm, no murmurs rubs or gallops, no lower extremity edema.  Respiratory: Clear to auscultation bilaterally. Not using accessory muscles, speaking in full sentences.  Procedure: Real-time Ultrasound Guided Injection of right glenohumeral joint Device: GE Logiq E  Verbal informed consent obtained.  Time-out conducted.  Noted no overlying erythema, induration, or other signs of local infection.  Skin prepped in a sterile fashion.  Local anesthesia: Topical Ethyl chloride.  With sterile technique and under real time  ultrasound guidance:  22-gauge spinal needle advanced into the joint and 1 mL kenalog 40, 1 mL lidocaine, 1 mL Marcaine injected easily. Completed without difficulty  Pain immediately resolved suggesting accurate placement of the medication.  Advised to call if fevers/chills, erythema, induration, drainage, or persistent bleeding.  Images permanently stored and available for review in the ultrasound unit.  Impression: Technically successful ultrasound guided injection.  Impression and Recommendations:    Right shoulder pain with history of right proximal biceps rupture Glenohumeral joint injection from approximately 3 months ago was successful, no having a recurrence of pain referable to the glenohumeral joint. Repeat injection today, return as needed.

## 2016-07-10 NOTE — Patient Instructions (Signed)
The injection was into the right glenohumeral joint and I used a combination of 2 mL lidocaine, 2 mL Marcaine, and 1 mL kenalog (triamcinolone) 40mg /mL

## 2016-09-11 ENCOUNTER — Telehealth: Payer: Self-pay

## 2016-09-11 NOTE — Telephone Encounter (Signed)
Pt left VM asking for another shoulder injection, last injection 07/10/16. Please advise.

## 2016-09-11 NOTE — Telephone Encounter (Signed)
Too early.  He has another month.  I don't see an NSAID on his med list so he should 2 aleve BID.  If doesn't work we can try to get orthovisc approved for his shoulder (long shot but worth a try).  ___________________________________________ [name], M.D., ABFM., CAQSM. Primary Care and Sports Medicine [facility]  Adjunct Instructor of Family Medicine  [employer] 4

## 2016-09-14 NOTE — Telephone Encounter (Signed)
Pt.notified

## 2016-10-09 ENCOUNTER — Ambulatory Visit (INDEPENDENT_AMBULATORY_CARE_PROVIDER_SITE_OTHER): Payer: Medicare Other | Admitting: Sports Medicine

## 2016-10-09 DIAGNOSIS — S46211D Strain of muscle, fascia and tendon of other parts of biceps, right arm, subsequent encounter: Secondary | ICD-10-CM | POA: Diagnosis not present

## 2016-10-09 NOTE — Progress Notes (Signed)
  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 We injected him 3 months ago with good relief, he is here with recurrence of pain, moderate, persistent and desires repeat right glenohumeral injection.  Past medical history:  Negative.  See flowsheet/record as well for more information.  Surgical history: Negative.  See flowsheet/record as well for more information.  Family history: Negative.  See flowsheet/record as well for more information.  Social history: Negative.  See flowsheet/record as well for more information.  Allergies, and medications have been entered into the medical record, reviewed, and no changes needed.   Review of Systems: No fevers, chills, night sweats, weight loss, chest pain, or shortness of breath.   Objective:    General: Well Developed, well nourished, and in no acute distress.  Neuro: Alert and oriented x3, extra-ocular muscles intact, sensation grossly intact.  HEENT: Normocephalic, atraumatic, pupils equal round reactive to light, neck supple, no masses, no lymphadenopathy, thyroid nonpalpable.  Skin: Warm and dry, no rashes. Cardiac: Regular rate and rhythm, no murmurs rubs or gallops, no lower extremity edema.  Respiratory: Clear to auscultation bilaterally. Not using accessory muscles, speaking in full sentences.  Procedure: Real-time Ultrasound Guided Injection of right glenohumeral joint Device: GE Logiq E  Verbal informed consent obtained.  Time-out conducted.  Noted no overlying erythema, induration, or other signs of local infection.  Skin prepped in a sterile fashion.  Local anesthesia: Topical Ethyl chloride.  With sterile technique and under real time ultrasound guidance:  22-gauge spinal needle advanced into the glenohumeral joint and 1 mL kenalog 40, 2 mL Marcaine injected easily. Completed without difficulty  Pain immediately  resolved suggesting accurate placement of the medication.  Advised to call if fevers/chills, erythema, induration, drainage, or persistent bleeding.  Images permanently stored and available for review in the ultrasound unit.  Impression: Technically successful ultrasound guided injection. Impression and Recommendations:    Right shoulder pain with history of right proximal biceps rupture Repeat right glenohumeral injection, previous injection was 3 months ago.

## 2016-10-09 NOTE — Assessment & Plan Note (Signed)
Repeat right glenohumeral injection, previous injection was 3 months ago.

## 2016-11-01 ENCOUNTER — Other Ambulatory Visit: Payer: Self-pay | Admitting: Sports Medicine

## 2016-11-01 DIAGNOSIS — M5136 Other intervertebral disc degeneration, lumbar region: Secondary | ICD-10-CM

## 2017-01-14 ENCOUNTER — Encounter: Payer: Self-pay | Admitting: Hematology

## 2017-01-14 ENCOUNTER — Telehealth: Payer: Self-pay | Admitting: Hematology

## 2017-01-14 NOTE — Telephone Encounter (Signed)
Appt has been scheduled for the pt to see Dr. Candise CheKale on 6/6 at 11am. Will fax the referring a letter to notify the pt. Letter mailed to the pt.

## 2017-01-23 ENCOUNTER — Other Ambulatory Visit: Payer: Self-pay | Admitting: Allergy & Immunology

## 2017-01-25 NOTE — Telephone Encounter (Signed)
I denied refill for montelukast. Patient was last seen 11/06/2015. Montelukast was filled on 04/13/2016. Patient needs office visit for further refills.

## 2017-01-27 ENCOUNTER — Telehealth: Payer: Self-pay | Admitting: Hematology

## 2017-01-27 ENCOUNTER — Other Ambulatory Visit: Payer: Self-pay | Admitting: Allergy & Immunology

## 2017-01-27 ENCOUNTER — Encounter: Payer: Self-pay | Admitting: Hematology

## 2017-01-27 ENCOUNTER — Ambulatory Visit (HOSPITAL_BASED_OUTPATIENT_CLINIC_OR_DEPARTMENT_OTHER): Payer: Medicare Other | Admitting: Hematology

## 2017-01-27 VITALS — BP 159/63 | HR 60 | Temp 98.0°F | Resp 18 | Ht 66.0 in | Wt 165.4 lb

## 2017-01-27 DIAGNOSIS — D696 Thrombocytopenia, unspecified: Secondary | ICD-10-CM | POA: Diagnosis not present

## 2017-01-27 NOTE — Telephone Encounter (Signed)
No additional appts scheduled  Per 6/6 los. - rtc on an as needed basis

## 2017-01-27 NOTE — Patient Instructions (Signed)
Thank you for choosing [facility] to provide your oncology and hematology care.  To afford each patient quality time with our providers, please arrive 30 minutes before your scheduled appointment time.  If you arrive late for your appointment, you may be asked to reschedule.  We strive to give you quality time with our providers, and arriving late affects you and other patients whose appointments are after yours.   If you are a no show for multiple scheduled visits, you may be dismissed from the clinic at the providers discretion.    Again, thank you for choosing [facility], our hope is that these requests will decrease the amount of time that you wait before being seen by our physicians.  ______________________________________________________________________  Should you have questions after your visit to the [facility], please contact our office at [phone] between the hours of 8:30 and 4:30 p.m.    Voicemails left after 4:30p.m will not be returned until the following business day.    For prescription refill requests, please have your pharmacy contact us directly.  Please also try to allow 48 hours for prescription requests.    Please contact the scheduling department for questions regarding scheduling.  For scheduling of procedures such as PET scans, CT scans, MRI, Ultrasound, etc please contact central scheduling at [phone].    Resources For Cancer Patients and Caregivers:   Oncolink.org:  A wonderful resource for patients and healthcare providers for information regarding your disease, ways to tract your treatment, what to expect, etc.     American Cancer Society:  800-227-2345  Can help patients locate various types of support and financial assistance  Cancer Care: 1-800-813-HOPE (4673) Provides financial assistance, online support groups, medication/co-pay assistance.    [place] DSS:  336-641-3447 Where to apply for food  stamps, Medicaid, and utility assistance  Medicare Rights Center: 800-333-4114 Helps people with Medicare understand their rights and benefits, navigate the Medicare system, and secure the quality healthcare they deserve  SCAT: 336-333-6589 [employer]'s shared-ride transportation service for eligible riders who have a disability that prevents them from riding the fixed route bus.    For additional information on assistance programs please contact our social worker:   Grier Hock/Abigail Elmore:  [phone]            

## 2017-02-01 ENCOUNTER — Encounter: Payer: Self-pay | Admitting: Sports Medicine

## 2017-02-01 ENCOUNTER — Ambulatory Visit (INDEPENDENT_AMBULATORY_CARE_PROVIDER_SITE_OTHER): Payer: Medicare Other | Admitting: Sports Medicine

## 2017-02-01 DIAGNOSIS — S46211D Strain of muscle, fascia and tendon of other parts of biceps, right arm, subsequent encounter: Secondary | ICD-10-CM | POA: Diagnosis not present

## 2017-02-01 NOTE — Progress Notes (Signed)
  Subjective:    CC: Follow-up  HPI: Right shoulder pain: Previous glenohumeral injections have done wonders, last one was 3 months ago, now having a recurrence of pain, moderate, persistent, localized without radiation.  Past medical history:  Negative.  See flowsheet/record as well for more information.  Surgical history: Negative.  See flowsheet/record as well for more information.  Family history: Negative.  See flowsheet/record as well for more information.  Social history: Negative.  See flowsheet/record as well for more information.  Allergies, and medications have been entered into the medical record, reviewed, and no changes needed.   Review of Systems: No fevers, chills, night sweats, weight loss, chest pain, or shortness of breath.   Objective:    General: Well Developed, well nourished, and in no acute distress.  Neuro: Alert and oriented x3, extra-ocular muscles intact, sensation grossly intact.  HEENT: Normocephalic, atraumatic, pupils equal round reactive to light, neck supple, no masses, no lymphadenopathy, thyroid nonpalpable.  Skin: Warm and dry, no rashes. Cardiac: Regular rate and rhythm, no murmurs rubs or gallops, no lower extremity edema.  Respiratory: Clear to auscultation bilaterally. Not using accessory muscles, speaking in full sentences.  Procedure: Real-time Ultrasound Guided Injection of right glenohumeral joint  Device: GE Logiq E  Verbal informed consent obtained.  Time-out conducted.  Noted no overlying erythema, induration, or other signs of local infection.  Skin prepped in a sterile fashion.  Local anesthesia: Topical Ethyl chloride.  With sterile technique and under real time ultrasound guidance:  I advanced the 22-gauge spinal needle into the joint care to avoid the labrum, I injected 1 mL kenalog 40, 2 mL lidocaine, 2 mL bupivacaine.  Completed without difficulty  Pain immediately resolved suggesting accurate placement of the medication.    Advised to call if fevers/chills, erythema, induration, drainage, or persistent bleeding.  Images permanently stored and available for review in the ultrasound unit.  Impression: Technically successful ultrasound guided injection.  Impression and Recommendations:    Right shoulder pain with history of right proximal biceps rupture Right glenohumeral injection, previous injection was 4 months ago.

## 2017-02-01 NOTE — Assessment & Plan Note (Signed)
Right glenohumeral injection, previous injection was 4 months ago.

## 2017-02-09 ENCOUNTER — Encounter: Payer: Self-pay | Admitting: Allergy and Immunology

## 2017-02-09 ENCOUNTER — Ambulatory Visit (INDEPENDENT_AMBULATORY_CARE_PROVIDER_SITE_OTHER): Payer: Medicare Other | Admitting: Allergy and Immunology

## 2017-02-09 VITALS — BP 120/82 | HR 60 | Resp 16 | Ht 64.0 in | Wt 164.0 lb

## 2017-02-09 DIAGNOSIS — K219 Gastro-esophageal reflux disease without esophagitis: Secondary | ICD-10-CM | POA: Diagnosis not present

## 2017-02-09 DIAGNOSIS — J4520 Mild intermittent asthma, uncomplicated: Secondary | ICD-10-CM

## 2017-02-09 DIAGNOSIS — J3089 Other allergic rhinitis: Secondary | ICD-10-CM

## 2017-02-09 MED ORDER — DEXLANSOPRAZOLE 60 MG PO CPDR: 60.0000 mg | DELAYED_RELEASE_CAPSULE | ORAL | 5 refills | Status: DC

## 2017-02-09 MED ORDER — ALBUTEROL SULFATE HFA 108 (90 BASE) MCG/ACT IN AERS: INHALATION_SPRAY | RESPIRATORY_TRACT | 1 refills | Status: DC

## 2017-02-09 MED ORDER — MONTELUKAST SODIUM 10 MG PO TABS: 10.0000 mg | ORAL_TABLET | Freq: Every day | ORAL | 1 refills | Status: DC

## 2017-02-09 MED ORDER — FLUTICASONE PROPIONATE 50 MCG/ACT NA SUSP: 1.0000 | Freq: Two times a day (BID) | NASAL | 5 refills | Status: DC

## 2017-02-09 MED ORDER — AZELASTINE HCL 0.1 % NA SOLN: 1.0000 | Freq: Two times a day (BID) | NASAL | 5 refills | Status: DC

## 2017-02-09 NOTE — Patient Instructions (Addendum)
    1. Treat and prevent reflux:   A. DEXILANT 60mg  one tablet one time per day in a.m. Samples.  B. ranitidine 300 mg one tablet one time per day in p.m.  C. keep head of bed elevated  D. attempt to consolidate all forms of caffeine consumption  E. minimize late meal consumption  2. Treat and prevent inflammation:   A. Flonase one spray each nostril twice a day  B. Azelastine one spray each nostril twice a day  C. montelukast 10 mg daily  3. If needed:   A. Xyzal 5 mg tablet 1 time per day  B. ProAir HFA 2 puffs every 4-6 hours  4. Return to clinic in 4 weeks or earlier if problem

## 2017-02-09 NOTE — Progress Notes (Signed)
Follow-up Note  Referring Provider: Georgann Housekeeper, MD Primary Provider: Georgann Housekeeper, MD Date of Office Visit: 02/09/2017  Subjective:   Alejandro Hicks (DOB: [birth year]) is a 83 y.o. male who [facility] in re-evaluation of the following:  HPI: Body returns to this clinic in evaluation of persistent respiratory tract symptoms. He has not been seen in this clinic since March 2017.  His issue revolves around his throat. He has a chronic cough associated with throat clearing and a tickle stuck in his throat. He has gagging in the morning and a clearing out type of cough in the morning. He is raspy all the time. He is a Psychologist, clinical and a singer and he has noticed dramatic decrease in his quality of voice. This is been a long-standing issue of multiple years duration. He has seen a ENT doctor many years ago in investigation of this issue.  He has bad reflux. He refluxes up into his throat. He will jump out of bed with burning in his throat and gagging. He has elevated the head of his bed and he uses over-the-counter Nexium every morning. He does have a cup of coffee in the morning but no other forms of caffeine or alcohol or chocolate through the day. If he eats late he has a very difficult time sleeping through the night because of reflux.  He has been diagnosed with asthma. He has been given a short acting rescue inhaler which may or may not help him very much regarding his cough. He does not have any significant chest tightness or shortness of breath or dyspnea on exertion.  His nose does relatively well as long as he continues to use nasal steroids. However, he does get issues with some intermittent nasal congestion. He does not have any anosmia or history of ugly nasal discharge.  Allergies as of 02/09/2017      Reactions   Lipitor [atorvastatin]    Myalgia   Zocor [simvastatin]    Myalgia      Medication List      acyclovir 800 MG  tablet Commonly known as:  ZOVIRAX TAKE 1 TABLET BY MOUTH AS DIRECTED, TAKE TWO TABLET BY MOUTH FOR 5 DAYS PER FLARE   AEROCHAMBER PLUS FLO-VU LARGE Misc 1 each by Other route once.   albuterol 108 (90 Base) MCG/ACT inhaler Commonly known as:  PROAIR HFA Use 2 puffs every 4 hours as needed for cough and wheeze. May use 10-20 minutes before exercise   ALPRAZolam 0.25 MG tablet Commonly known as:  XANAX Take 0.25 mg by mouth 2 (two) times daily as needed for anxiety.   aspirin EC 81 MG tablet Take 81 mg by mouth daily.   Azelastine-Fluticasone 137-50 MCG/ACT Susp Commonly known as:  DYMISTA Use 1 spray per nostril twice a day for stuffy nose or drainage   DHEA PO Take by mouth daily.   levocetirizine 5 MG tablet Commonly known as:  XYZAL TAKE 1 TABLET BY MOUTH ONCE A DAY IN THE EVENING   montelukast 10 MG tablet Commonly known as:  SINGULAIR Take 1 tablet (10 mg total) by mouth at bedtime.   Pregnenolone Micronized Powd Use as directed in the mouth or throat daily.   tadalafil 20 MG tablet Commonly known as:  CIALIS Take 5 mg by mouth daily as needed for erectile dysfunction.   testosterone cypionate 200 MG/ML injection Commonly known as:  DEPOTESTOSTERONE CYPIONATE Inject 0.2 mg into the  muscle once a week.   valsartan 160 MG tablet Commonly known as:  DIOVAN TAKE 1 TABLET (160 MG TOTAL) BY MOUTH DAILY.   valsartan-hydrochlorothiazide 320-25 MG tablet Commonly known as:  DIOVAN-HCT Take 1 tablet by mouth daily.   Vitamin D (Cholecalciferol) 1000 units Tabs Take by mouth daily.   VITAMIN K2 PO Take by mouth daily.       Past Medical History:  Diagnosis Date  . Allergic rhinitis   . Anxiety   . Asthma   . BPH (benign prostatic hypertrophy)   . Colonic polyp   . Dyslipidemia   . ED (erectile dysfunction)   . GERD (gastroesophageal reflux disease)   . Inguinal hernia    Left S/P repair in 2012  . Leukoplakia of vocal cords   . OA (osteoarthritis)     . OSA (obstructive sleep apnea)    Does not use CPAP  . Reflux laryngitis    ENT ecal, PPIs twice a day  . Rosacea     Past Surgical History:  Procedure Laterality Date  . COLONOSCOPY  2009  . INGUINAL HERNIA REPAIR Left    2012    Review of systems negative except as noted in HPI / PMHx or noted below:  Review of Systems  Constitutional: Negative.   HENT: Negative.   Eyes: Negative.   Respiratory: Negative.   Cardiovascular: Negative.   Gastrointestinal: Negative.   Genitourinary: Negative.   Musculoskeletal: Negative.   Skin: Negative.   Neurological: Negative.   Endo/Heme/Allergies: Negative.   Psychiatric/Behavioral: Negative.      Objective:   Vitals:   02/09/17 1035  BP: 120/82  Pulse: 60  Resp: 16   Height: 5\' 4"  (162.6 cm)  Weight: 164 lb (74.4 kg)   Physical Exam  Constitutional: He is well-developed, well-nourished, and in no distress.  Throat clearing, cough, slightly raspy voice  HENT:  Head: Normocephalic.  Right Ear: Tympanic membrane, external ear and ear canal normal.  Left Ear: Tympanic membrane, external ear and ear canal normal.  Nose: Nose normal. No mucosal edema or rhinorrhea.  Mouth/Throat: Uvula is midline, oropharynx is clear and moist and mucous membranes are normal. No oropharyngeal exudate.  Eyes: Conjunctivae are normal.  Neck: Trachea normal. No tracheal tenderness present. No tracheal deviation present. No thyromegaly present.  Cardiovascular: Normal rate, regular rhythm, S1 normal, S2 normal and normal heart sounds.   No murmur heard. Pulmonary/Chest: Breath sounds normal. No stridor. No respiratory distress. He has no wheezes. He has no rales.  Musculoskeletal: He exhibits no edema.  Lymphadenopathy:       Head (right side): No tonsillar adenopathy present.       Head (left side): No tonsillar adenopathy present.    He has no cervical adenopathy.  Neurological: He is alert. Gait normal.  Skin: No rash noted. He is not  diaphoretic. No erythema. Nails show no clubbing.  Psychiatric: Mood and affect normal.    Diagnostics:    Spirometry was performed and demonstrated an FEV1 of 2.83 at 118 % of predicted.  The patient had an Asthma Control Test with the following results: ACT Total Score: 18.    Assessment and Plan:   1. Asthma, mild intermittent, well-controlled   2. Other allergic rhinitis   3. LPRD (laryngopharyngeal reflux disease)      1. Treat and prevent reflux:   A. DEXILANT 60mg  one tablet one time per day in a.m. Samples.  B. ranitidine 300 mg one tablet one time per day  in p.m.  C. keep head of bed elevated  D. attempt to consolidate all forms of caffeine consumption  E. minimize late meal consumption  2. Treat and prevent inflammation:   A. Flonase one spray each nostril twice a day  B. Azelastine one spray each nostril twice a day  C. montelukast 10 mg daily  3. If needed:   A. Xyzal 5 mg tablet 1 time per day  B. ProAir HFA 2 puffs every 4-6 hours  4. Return to clinic in 4 weeks or earlier if problem  I think that Kean has rather significant LPR and we will treat him empirically for this condition with the therapy noted above and if he does not have a good response over the course of the next 4 weeks we will refer him back to an ENT doctor to have his throat examined once again. He will continue to use anti-inflammatory agents for his upper airway and I have added nasal antihistamine to be used to help with any lingering upper airway symptoms that exist. I don't think he needs any further evaluation for his mild intermittent asthma at this point in time. I will regroup with him in 4 weeks or earlier if there is a problem.  Laurette Schimke, MD Allergy / Immunology [facility]

## 2017-02-11 ENCOUNTER — Other Ambulatory Visit: Payer: Self-pay

## 2017-02-11 MED ORDER — RANITIDINE HCL 300 MG PO TABS: 300.0000 mg | ORAL_TABLET | Freq: Every day | ORAL | 5 refills | Status: DC

## 2017-03-09 ENCOUNTER — Ambulatory Visit (INDEPENDENT_AMBULATORY_CARE_PROVIDER_SITE_OTHER): Payer: Medicare Other | Admitting: Allergy and Immunology

## 2017-03-09 ENCOUNTER — Encounter: Payer: Self-pay | Admitting: Allergy and Immunology

## 2017-03-09 VITALS — BP 128/70 | HR 72 | Resp 16

## 2017-03-09 DIAGNOSIS — J3089 Other allergic rhinitis: Secondary | ICD-10-CM | POA: Diagnosis not present

## 2017-03-09 DIAGNOSIS — K219 Gastro-esophageal reflux disease without esophagitis: Secondary | ICD-10-CM

## 2017-03-09 DIAGNOSIS — J4520 Mild intermittent asthma, uncomplicated: Secondary | ICD-10-CM

## 2017-03-09 NOTE — Patient Instructions (Addendum)
    1. Treat and prevent reflux:   A. DEXILANT 60mg  one tablet one time per day in a.m. Samples.  B. ranitidine 300 mg one tablet one time per day in p.m.  C. keep head of bed elevated  D. attempt to consolidate all forms of caffeine consumption  E. minimize late meal consumption  2. Treat and prevent inflammation:   A. Flonase one spray each nostril twice a day  B. Azelastine one spray each nostril twice a day  C. montelukast 10 mg daily  3. If needed:   A. Xyzal 5 mg tablet 1 time per day  B. ProAir HFA 2 puffs every 4-6 hours  4. Return to clinic in 8 weeks or earlier if problem

## 2017-03-09 NOTE — Progress Notes (Signed)
Follow-up Note  Referring Provider: Georgann HousekeeperHusain, Karrar, MD Primary Provider: Georgann HousekeeperHusain, Karrar, MD Date of Office Visit: 03/09/2017  Subjective:   Alejandro Hicks (DOB: Oct 19, 1933) is a 83 y.o. male who [facility] in re-evaluation of the following:  HPI: [name] returns to this clinic in evaluation of his LPR and asthma and rhinitis addressed during his last visit of 02/09/2017 at which point in time he used aggressive therapy directed against reflux.  While using a combination of a proton pump inhibitor in the morning and a H2 receptor blocker in the evening and consolidating his caffeine consumption to 1 coffee in the morning he has noticed improvement regarding his cough and throat clearing and mucus and he no longer has any gagging episodes and he has only had 1 episode or regurgitation since I have seen him in this clinic.  He has had no issues with any type of lower airway symptoms and does not have any shortness of breath and has only used a short-acting bronchodilator few times since his last visit.  He has had no problems with his nose while continuing on a combination of a nasal steroid and a nasal antihistamine.  Allergies as of 03/09/2017      Reactions   Lipitor [atorvastatin]    Myalgia   Zocor [simvastatin]    Myalgia      Medication List      acyclovir 800 MG tablet Commonly known as:  ZOVIRAX TAKE 1 TABLET BY MOUTH AS DIRECTED, TAKE TWO TABLET BY MOUTH FOR 5 DAYS PER FLARE   AEROCHAMBER PLUS FLO-VU LARGE Misc 1 each by Other route once.   albuterol 108 (90 Base) MCG/ACT inhaler Commonly known as:  PROAIR HFA Use 2 puffs every 4 hours as needed for cough and wheeze. May use 10-20 minutes before exercise   ALPRAZolam 0.25 MG tablet Commonly known as:  XANAX Take 0.25 mg by mouth 2 (two) times daily as needed for anxiety.   aspirin EC 81 MG tablet Take 81 mg by mouth daily.   azelastine 0.1 % nasal spray Commonly  known as:  ASTELIN Place 1 spray into both nostrils 2 (two) times daily.   dexlansoprazole 60 MG capsule Commonly known as:  DEXILANT Take 1 capsule (60 mg total) by mouth every morning.   DHEA PO Take by mouth daily.   fluticasone 50 MCG/ACT nasal spray Commonly known as:  FLONASE Place 1 spray into both nostrils 2 (two) times daily.   levocetirizine 5 MG tablet Commonly known as:  XYZAL TAKE 1 TABLET BY MOUTH ONCE A DAY IN THE EVENING   montelukast 10 MG tablet Commonly known as:  SINGULAIR Take 1 tablet (10 mg total) by mouth at bedtime.   Pregnenolone Micronized Powd Use as directed in the mouth or throat daily.   ranitidine 300 MG tablet Commonly known as:  ZANTAC Take 1 tablet (300 mg total) by mouth at bedtime.   tadalafil 20 MG tablet Commonly known as:  CIALIS Take 5 mg by mouth daily as needed for erectile dysfunction.   testosterone cypionate 200 MG/ML injection Commonly known as:  DEPOTESTOSTERONE CYPIONATE Inject 0.2 mg into the muscle once a week.   valsartan 160 MG tablet Commonly known as:  DIOVAN TAKE 1 TABLET (160 MG TOTAL) BY MOUTH DAILY.   Vitamin D (Cholecalciferol) 1000 units Tabs Take by mouth daily.   VITAMIN K2 PO Take by mouth daily.  Past Medical History:  Diagnosis Date  . Allergic rhinitis   . Anxiety   . Asthma   . BPH (benign prostatic hypertrophy)   . Colonic polyp   . Dyslipidemia   . ED (erectile dysfunction)   . GERD (gastroesophageal reflux disease)   . Inguinal hernia    Left S/P repair in 2012  . Leukoplakia of vocal cords   . OA (osteoarthritis)   . OSA (obstructive sleep apnea)    Does not use CPAP  . Reflux laryngitis    ENT ecal, PPIs twice a day  . Rosacea     Past Surgical History:  Procedure Laterality Date  . COLONOSCOPY  2009  . INGUINAL HERNIA REPAIR Left    2012    Review of systems negative except as noted in HPI / PMHx or noted below:  Review of Systems  Constitutional: Negative.     HENT: Negative.   Eyes: Negative.   Respiratory: Negative.   Cardiovascular: Negative.   Gastrointestinal: Negative.   Genitourinary: Negative.   Musculoskeletal: Negative.   Skin: Negative.   Neurological: Negative.   Endo/Heme/Allergies: Negative.   Psychiatric/Behavioral: Negative.      Objective:   Vitals:   03/09/17 1139  BP: 128/70  Pulse: 72  Resp: 16          Physical Exam  Constitutional: He is well-developed, well-nourished, and in no distress.  HENT:  Head: Normocephalic.  Right Ear: External ear normal.  Left Ear: External ear normal.  Nose: Nose normal. No mucosal edema or rhinorrhea.  Mouth/Throat: Uvula is midline, oropharynx is clear and moist and mucous membranes are normal. No oropharyngeal exudate.  Eyes: Conjunctivae are normal.  Neck: Trachea normal. No tracheal tenderness present. No tracheal deviation present. No thyromegaly present.  Cardiovascular: Normal rate, regular rhythm, S1 normal, S2 normal and normal heart sounds.   No murmur heard. Pulmonary/Chest: Breath sounds normal. No stridor. No respiratory distress. He has no wheezes. He has no rales.  Musculoskeletal: He exhibits no edema.  Lymphadenopathy:       Head (right side): No tonsillar adenopathy present.       Head (left side): No tonsillar adenopathy present.    He has no cervical adenopathy.  Neurological: He is alert. Gait normal.  Skin: No rash noted. He is not diaphoretic. No erythema. Nails show no clubbing.  Psychiatric: Mood and affect normal.    Diagnostics:    Spirometry was performed and demonstrated an FEV1 of 2.37 at 119 % of predicted.  Assessment and Plan:   1. Asthma, mild intermittent, well-controlled   2. Other allergic rhinitis   3. LPRD (laryngopharyngeal reflux disease)      1. Treat and prevent reflux:   A. DEXILANT 60mg  one tablet one time per day in a.m. Samples.  B. ranitidine 300 mg one tablet one time per day in p.m.  C. keep head of bed  elevated  D. attempt to consolidate all forms of caffeine consumption  E. minimize late meal consumption  2. Treat and prevent inflammation:   A. Flonase one spray each nostril twice a day  B. Azelastine one spray each nostril twice a day  C. montelukast 10 mg daily  3. If needed:   A. Xyzal 5 mg tablet 1 time per day  B. ProAir HFA 2 puffs every 4-6 hours  4. Return to clinic in 8 weeks or earlier if problem  Bond appears to have some improvement with the initial 4 weeks of his therapy  directed against reflux-induced respiratory disease and I would like to continue to have him use the plan mentioned above for an additional 8 weeks which will give him a total of 12 weeks of therapy. I have also asked him to consolidate his caffeine by decreasing his daily cup of coffee to a half caffeinated cup of coffee. He will remain on his other medications directed against respiratory tract inflammation as noted above. I will see him back in this clinic in 8 weeks.  Laurette Schimke, MD Allergy / Immunology [facility]

## 2017-03-28 NOTE — Progress Notes (Signed)
Marland Kitchen.    HEMATOLOGY/ONCOLOGY CONSULTATION NOTE  Date of Service: 03/28/2017  Patient Care Team: Alejandro Hicks, Karrar, MD as PCP - General (Internal Medicine)  CHIEF COMPLAINTS/PURPOSE OF CONSULTATION:  Low platelets  HISTORY OF PRESENTING ILLNESS:   Alejandro Hicks is a wonderful 83 y.o. male who has been referred to us by Dr .Alejandro Hicks, Karrar, MD for evaluation and management of low platelets.  Patient has a history of hypertension, dyslipidemia, BPH, erectile dysfunction, GERD, osteoarthritis, sleep apnea does not use CPAP machine, reflux laryngitis, rosacea. Patient recently had a CBC with his primary care physician on 01/13/2017 which showed a normal hemoglobin of 16.5, normal WBC, 5.5k and borderline thrombocytopenia with a platelet count of 137k.  Patient notes that he recently had a sinus infection and has been on Bactrim about 2 weeks ago. He is on chronic PPI therapy for GERD twice daily.  Review of labs available from his primary care physician and in our system show platelet counts of 137k 01/13/2017, 143k on 12/18/2016, 138k and 09/29/2014 and 133k on 05/30/2011.  No GI bleeding or hematuria no gum bleeds no epistaxis .he notes no overt significant bleeding. Has some intermittent minor skin bruising but has been on aspirin 81 mg by mouth daily.  No fevers no chills no night sweats no unexpected weight loss. Overall the patient feels quite well.  he is on Depo-testosterone shots presumably for low testosterone levels . No history of liver disease .  MEDICAL HISTORY:  Past Medical History:  Diagnosis Date  . Allergic rhinitis   . Anxiety   . Asthma   . BPH (benign prostatic hypertrophy)   . Colonic polyp   . Dyslipidemia   . ED (erectile dysfunction)   . GERD (gastroesophageal reflux disease)   . Inguinal hernia    Left S/P repair in 2012  . Leukoplakia of vocal cords   . OA (osteoarthritis)   . OSA (obstructive sleep apnea)    Does not use CPAP  . Reflux laryngitis    ENT ecal,  PPIs twice a day  . Rosacea     SURGICAL HISTORY: Past Surgical History:  Procedure Laterality Date  . COLONOSCOPY  2009  . INGUINAL HERNIA REPAIR Left    2012    SOCIAL HISTORY: Social History   Social History  . Marital status: Unknown    Spouse name: N/A  . Number of children: N/A  . Years of education: N/A   Occupational History  . Not on file.   Social History Main Topics  . Smoking status: Former Games developermoker  . Smokeless tobacco: Never Used     Comment: Quit 1970  . Alcohol use Yes     Comment: social  . Drug use: No  . Sexual activity: Not on file   Other Topics Concern  . Not on file   Social History Narrative  . No narrative on file    FAMILY HISTORY: History reviewed. No pertinent family history.  ALLERGIES:  is allergic to lipitor [atorvastatin] and zocor [simvastatin].  MEDICATIONS:  Current Outpatient Prescriptions  Medication Sig Dispense Refill  . ALPRAZolam (XANAX) 0.25 MG tablet Take 0.25 mg by mouth 2 (two) times daily as needed for anxiety.    Marland Kitchen. aspirin EC 81 MG tablet Take 81 mg by mouth daily.    Marland Kitchen. levocetirizine (XYZAL) 5 MG tablet TAKE 1 TABLET BY MOUTH ONCE A DAY IN THE EVENING  11  . Menaquinone-7 (VITAMIN K2 PO) Take by mouth daily.    . Nutritional  Supplements (DHEA PO) Take by mouth daily.    . Pregnenolone Micronized POWD Use as directed in the mouth or throat daily.    Marland Kitchen Spacer/Aero-Holding Chambers (AEROCHAMBER PLUS FLO-VU LARGE) MISC 1 each by Other route once. 1 each 3  . tadalafil (CIALIS) 20 MG tablet Take 5 mg by mouth daily as needed for erectile dysfunction.     Marland Kitchen testosterone cypionate (DEPOTESTOSTERONE CYPIONATE) 200 MG/ML injection Inject 0.2 mg into the muscle once a week.     . valsartan (DIOVAN) 160 MG tablet TAKE 1 TABLET (160 MG TOTAL) BY MOUTH DAILY.  11  . Vitamin D, Cholecalciferol, 1000 UNITS TABS Take by mouth daily.    Marland Kitchen acyclovir (ZOVIRAX) 800 MG tablet TAKE 1 TABLET BY MOUTH AS DIRECTED, TAKE TWO TABLET BY  MOUTH FOR 5 DAYS PER FLARE  3  . albuterol (PROAIR HFA) 108 (90 Base) MCG/ACT inhaler Use 2 puffs every 4 hours as needed for cough and wheeze. May use 10-20 minutes before exercise 1 Inhaler 1  . azelastine (ASTELIN) 0.1 % nasal spray Place 1 spray into both nostrils 2 (two) times daily. 30 mL 5  . dexlansoprazole (DEXILANT) 60 MG capsule Take 1 capsule (60 mg total) by mouth every morning. 30 capsule 5  . fluticasone (FLONASE) 50 MCG/ACT nasal spray Place 1 spray into both nostrils 2 (two) times daily. 16 g 5  . montelukast (SINGULAIR) 10 MG tablet Take 1 tablet (10 mg total) by mouth at bedtime. 90 tablet 1  . ranitidine (ZANTAC) 300 MG tablet Take 1 tablet (300 mg total) by mouth at bedtime. 30 tablet 5   No current facility-administered medications for this visit.     REVIEW OF SYSTEMS:    10 Point review of Systems was done is negative except as noted above.  PHYSICAL EXAMINATION: ECOG PERFORMANCE STATUS: 2 - Symptomatic, <50% confined to bed  . Vitals:   01/27/17 1117  BP: (!) 159/63  Pulse: 60  Resp: 18  Temp: 98 F (36.7 C)   Filed Weights   01/27/17 1117  Weight: 165 lb 6.4 oz (75 kg)   .Body mass index is 26.7 kg/m.  GENERAL:alert, in no acute distress and comfortable SKIN: no acute rashes, no significant lesions EYES: conjunctiva are pink and non-injected, sclera anicteric OROPHARYNX: MMM, no exudates, no oropharyngeal erythema or ulceration NECK: supple, no JVD LYMPH:  no palpable lymphadenopathy in the cervical, axillary or inguinal regions LUNGS: clear to auscultation b/l with normal respiratory effort HEART: regular rate & rhythm ABDOMEN:  normoactive bowel sounds , non tender, not distended.No palpable hepatosplenomegaly  Extremity: no pedal edema PSYCH: alert & oriented x 3 with fluent speech NEURO: no focal motor/sensory deficits  LABORATORY DATA:  I have reviewed the data as listed  . CBC Latest Ref Rng & Units 09/29/2014 05/30/2011  WBC 3.8 - 10.6  x10 3/mm 3 8.3 6.8  Hemoglobin 13.0 - 18.0 g/dL 44.0 10.2  Hematocrit 72.5 - 52.0 % 46.3 43.5  Platelets 150 - 440 x10 3/mm 3 138(L) 133(L)    . CMP Latest Ref Rng & Units 05/30/2011  Glucose 70 - 99 mg/dL 366(Y)  BUN 6 - 23 mg/dL 17  Creatinine 4.03 - 4.74 mg/dL 2.59  Sodium 563 - 875 mEq/L 138  Potassium 3.5 - 5.1 mEq/L 4.0  Chloride 96 - 112 mEq/L 103  CO2 19 - 32 mEq/L 24  Calcium 8.4 - 10.5 mg/dL 9.2     RADIOGRAPHIC STUDIES: I have personally reviewed the radiological images  as listed and agreed with the findings in the report. No results found.  ASSESSMENT & PLAN:   83 year old male with  #1 Chronic mild thrombocytopenia Patient's platelet counts have been in the range of 130 to 140k since 2012 with recent labs being 137k on 01/13/2017. Patient has had no significant bleeding issues related to this. He is on chronic PPI twice a day which can cause thrombo-cytopenia. He is also on testosterone replacement which can stimulate erythropoiesis and sometimes cause polycythemia and can cause some mild associated thrombocytopenia due to preferential erythropoiesis. He was recently on Bactrim for a sinus infection that could also worsen thrombocytopenia. Plan -I reviewed the patient's clinical and lab information and details and discussed this with him. -We discussed that his platelets are only mildly low and that given the stability over the last 6 years and the near-normal nature of his counts we would not recommend significant additional workup at this time. -Minimize indications that can cause thrombocytopenia if possible. -CBC every 6 months with primary care physician to monitor platelet counts. Kindly consult us if his platelet counts drop below 100k for consideration of additional workup. -Reasonable to take a daily B complex or multivitamin.  #2. Patient Active Problem List   Diagnosis Date Noted  . Rib pain on right side 12/09/2015  . Right shoulder pain with history  of right proximal biceps rupture 12/09/2015  . Essential hypertension, benign 10/21/2015  . Lumbar degenerative disc disease 09/02/2015  . Right cervical radiculopathy 08/05/2015  . Past Medical History:  Diagnosis Date  . Allergic rhinitis   . Anxiety   . Asthma   . BPH (benign prostatic hypertrophy)   . Colonic polyp   . Dyslipidemia   . ED (erectile dysfunction)   . GERD (gastroesophageal reflux disease)   . Inguinal hernia    Left S/P repair in 2012  . Leukoplakia of vocal cords   . OA (osteoarthritis)   . OSA (obstructive sleep apnea)    Does not use CPAP  . Reflux laryngitis    ENT ecal, PPIs twice a day  . Rosacea    Plan -Continue follow-up with primary care physician for management of other medical comorbidities.  Reconsult us as needed if platelet counts less than 100k. No additional workup recommended at this time.    All of the patients questions were answered with apparent satisfaction. The patient knows to call the clinic with any problems, questions or concerns.  I spent 30 minutes counseling the patient face to face. The total time spent in the appointment was 45 minutes and more than 50% was on counseling and direct patient cares.    Wyvonnia LoraGautam Amado Andal MD MS AAHIVMS [facility] Hematology/Oncology Physician [facility]  (Office):       [phone] (Work cell):  [phone] (Fax):           [phone]

## 2017-05-04 ENCOUNTER — Ambulatory Visit: Payer: Medicare Other | Admitting: Allergy and Immunology

## 2017-05-04 DIAGNOSIS — J309 Allergic rhinitis, unspecified: Secondary | ICD-10-CM

## 2017-05-10 ENCOUNTER — Telehealth: Payer: Self-pay | Admitting: Allergy and Immunology

## 2017-05-10 MED ORDER — PANTOPRAZOLE SODIUM 40 MG PO TBEC: 40.0000 mg | DELAYED_RELEASE_TABLET | Freq: Every day | ORAL | 5 refills | Status: DC

## 2017-05-10 NOTE — Telephone Encounter (Signed)
Received fax for a new Rx for Pantoprazole to replace Dexilant due to it being much cheaper $7 vs $95. Please advise?

## 2017-05-10 NOTE — Addendum Note (Signed)
Addended by: Clarene Critchley on: 05/10/2017 04:23 PM   Modules accepted: Orders

## 2017-05-10 NOTE — Telephone Encounter (Signed)
Okay to replace dexilant with pantoprazole

## 2017-05-26 ENCOUNTER — Ambulatory Visit (INDEPENDENT_AMBULATORY_CARE_PROVIDER_SITE_OTHER): Payer: Medicare Other | Admitting: Allergy and Immunology

## 2017-05-26 VITALS — BP 120/70 | HR 70 | Temp 98.0°F | Resp 16

## 2017-05-26 DIAGNOSIS — K219 Gastro-esophageal reflux disease without esophagitis: Secondary | ICD-10-CM

## 2017-05-26 DIAGNOSIS — J4520 Mild intermittent asthma, uncomplicated: Secondary | ICD-10-CM

## 2017-05-26 DIAGNOSIS — J3089 Other allergic rhinitis: Secondary | ICD-10-CM

## 2017-05-26 NOTE — Progress Notes (Signed)
Follow-up Note  Referring Provider: Georgann Housekeeper, MD Primary Provider: Georgann Housekeeper, MD Date of Office Visit: 05/26/2017  Subjective:   Alejandro Hicks (DOB: [birth year]) is a 83 y.o. male who [facility] in re-evaluation of the following:  HPI: Rondy returns to this clinic in reevaluation of his LPR and mild asthma and rhinitis. His last visit to this clinic was July 2018.  During the interval he has done great with his respiratory tract. His cough and throat clearing and mucus stuck in his throat and gagging episodes and regurgitation episodes are gone. He rarely uses a short acting bronchodilator and has no shortness of breath. He is able to golf with no problem at all. His nose is doing wonderful and he has not been having any issues requiring him to either receive a systemic steroid or an antibiotic to treat any type of respiratory tract issue.  Because of an insurance issue he had to change his PPI to pantoprazole 40 mg.  Allergies as of 05/26/2017      Reactions   Lipitor [atorvastatin]    Myalgia   Zocor [simvastatin]    Myalgia      Medication List      acyclovir 800 MG tablet Commonly known as:  ZOVIRAX TAKE 1 TABLET BY MOUTH AS DIRECTED, TAKE TWO TABLET BY MOUTH FOR 5 DAYS PER FLARE   AEROCHAMBER PLUS FLO-VU LARGE Misc 1 each by Other route once.   albuterol 108 (90 Base) MCG/ACT inhaler Commonly known as:  PROAIR HFA Use 2 puffs every 4 hours as needed for cough and wheeze. May use 10-20 minutes before exercise   ALPRAZolam 0.25 MG tablet Commonly known as:  XANAX Take 0.25 mg by mouth 2 (two) times daily as needed for anxiety.   aspirin EC 81 MG tablet Take 81 mg by mouth daily.   azelastine 0.1 % nasal spray Commonly known as:  ASTELIN Place 1 spray into both nostrils 2 (two) times daily.   DHEA PO Take by mouth daily.   fluticasone 50 MCG/ACT nasal spray Commonly known as:  FLONASE Place 1 spray  into both nostrils 2 (two) times daily.   levocetirizine 5 MG tablet Commonly known as:  XYZAL TAKE 1 TABLET BY MOUTH ONCE A DAY IN THE EVENING   montelukast 10 MG tablet Commonly known as:  SINGULAIR Take 1 tablet (10 mg total) by mouth at bedtime.   pantoprazole 40 MG tablet Commonly known as:  PROTONIX Take 1 tablet (40 mg total) by mouth daily.   Pregnenolone Micronized Powd Use as directed in the mouth or throat daily.   ranitidine 300 MG tablet Commonly known as:  ZANTAC Take 1 tablet (300 mg total) by mouth at bedtime.   tadalafil 20 MG tablet Commonly known as:  CIALIS Take 5 mg by mouth daily as needed for erectile dysfunction.   testosterone cypionate 200 MG/ML injection Commonly known as:  DEPOTESTOSTERONE CYPIONATE Inject 0.2 mg into the muscle once a week.   valsartan 160 MG tablet Commonly known as:  DIOVAN TAKE 1 TABLET (160 MG TOTAL) BY MOUTH DAILY.   Vitamin D (Cholecalciferol) 1000 units Tabs Take by mouth daily.   VITAMIN K2 PO Take by mouth daily.       Past Medical History:  Diagnosis Date  . Allergic rhinitis   . Anxiety   . Asthma   . BPH (benign prostatic hypertrophy)   . Colonic polyp   .  Dyslipidemia   . ED (erectile dysfunction)   . GERD (gastroesophageal reflux disease)   . Inguinal hernia    Left S/P repair in 2012  . Leukoplakia of vocal cords   . OA (osteoarthritis)   . OSA (obstructive sleep apnea)    Does not use CPAP  . Reflux laryngitis    ENT ecal, PPIs twice a day  . Rosacea     Past Surgical History:  Procedure Laterality Date  . COLONOSCOPY  2009  . INGUINAL HERNIA REPAIR Left    2012    Review of systems negative except as noted in HPI / PMHx or noted below:  Review of Systems  Constitutional: Negative.   HENT: Negative.   Eyes: Negative.   Respiratory: Negative.   Cardiovascular: Negative.   Gastrointestinal: Negative.   Genitourinary: Negative.   Musculoskeletal: Negative.   Skin: Negative.     Neurological: Negative.   Endo/Heme/Allergies: Negative.   Psychiatric/Behavioral: Negative.      Objective:   Vitals:   05/26/17 1046  BP: 120/70  Pulse: 70  Resp: 16  Temp: 98 F (36.7 C)  SpO2: 97%          Physical Exam  Constitutional: He is well-developed, well-nourished, and in no distress.  HENT:  Head: Normocephalic.  Right Ear: Tympanic membrane, external ear and ear canal normal.  Left Ear: Tympanic membrane, external ear and ear canal normal.  Nose: Nose normal. No mucosal edema or rhinorrhea.  Mouth/Throat: Uvula is midline, oropharynx is clear and moist and mucous membranes are normal. No oropharyngeal exudate.  Eyes: Conjunctivae are normal.  Neck: Trachea normal. No tracheal tenderness present. No tracheal deviation present. No thyromegaly present.  Cardiovascular: Normal rate, regular rhythm, S1 normal, S2 normal and normal heart sounds.   No murmur heard. Pulmonary/Chest: Breath sounds normal. No stridor. No respiratory distress. He has no wheezes. He has no rales.  Musculoskeletal: He exhibits no edema.  Lymphadenopathy:       Head (right side): No tonsillar adenopathy present.       Head (left side): No tonsillar adenopathy present.    He has no cervical adenopathy.  Neurological: He is alert. Gait normal.  Skin: No rash noted. He is not diaphoretic. No erythema. Nails show no clubbing.  Psychiatric: Mood and affect normal.    Diagnostics:    Spirometry was performed and demonstrated an FEV1 of 2.57 at 119 % of predicted.  Assessment and Plan:   1. Asthma, mild intermittent, well-controlled   2. Other allergic rhinitis   3. LPRD (laryngopharyngeal reflux disease)      1. Treat and prevent reflux:   A. Pantoprazole 40 mg one tablet one time per day in a.m.   B. ranitidine 300 mg one tablet one time per day in p.m.  C. keep head of bed elevated  D. consolidate all forms of caffeine consumption  E. minimize late meal consumption  2.  Treat and prevent inflammation:   A. Flonase one spray each nostril twice a day  B. Azelastine one spray each nostril twice a day only if needed  C. DISCONTINUE montelukast  3. If needed:   A. Xyzal 5 mg tablet 1 time per day  B. ProAir HFA 2 puffs every 4-6 hours  4. Return to clinic in 12 weeks or earlier if problem  5. Obtain fall flu vaccine  Langston appears to be doing wonderful on his current therapy which basically includes anti-inflammatory medications for his airway and treatment for reflux.  We will now see if we can consolidate some of his treatment by discontinuing his montelukast and having him use a nasal antihistamine as needed. He will continue on all other medications and I will see him back in this clinic in 12 weeks or earlier if there is a problem.   Laurette Schimke, MD Allergy / Immunology [facility]

## 2017-05-26 NOTE — Patient Instructions (Addendum)
    1. Treat and prevent reflux:   A. Pantoprazole 40 mg one tablet one time per day in a.m.   B. ranitidine 300 mg one tablet one time per day in p.m.  C. keep head of bed elevated  D. consolidate all forms of caffeine consumption  E. minimize late meal consumption  2. Treat and prevent inflammation:   A. Flonase one spray each nostril twice a day  B. Azelastine one spray each nostril twice a day only if needed  C. DISCONTINUE montelukast  3. If needed:   A. Xyzal 5 mg tablet 1 time per day  B. ProAir HFA 2 puffs every 4-6 hours  4. Return to clinic in 12 weeks or earlier if problem  5. Obtain fall flu vaccine

## 2017-05-27 ENCOUNTER — Encounter: Payer: Self-pay | Admitting: Allergy and Immunology

## 2017-05-31 ENCOUNTER — Ambulatory Visit: Payer: Medicare Other | Admitting: Sports Medicine

## 2017-05-31 DIAGNOSIS — Z0189 Encounter for other specified special examinations: Secondary | ICD-10-CM

## 2017-06-01 ENCOUNTER — Encounter: Payer: Self-pay | Admitting: Sports Medicine

## 2017-06-01 ENCOUNTER — Ambulatory Visit (INDEPENDENT_AMBULATORY_CARE_PROVIDER_SITE_OTHER): Payer: Medicare Other | Admitting: Sports Medicine

## 2017-06-01 DIAGNOSIS — S46211D Strain of muscle, fascia and tendon of other parts of biceps, right arm, subsequent encounter: Secondary | ICD-10-CM | POA: Diagnosis not present

## 2017-06-01 NOTE — Assessment & Plan Note (Signed)
Repeat right glenohumeral injection, previous injection was 4 months ago.

## 2017-06-01 NOTE — Progress Notes (Signed)
  Subjective:    CC: Right shoulder pain  HPI: This is a pleasant 83 year old male with right glenohumeral degenerative changes, and a history of a biceps rupture, he has done well with occasional glenohumeral injections. Having a recurrence of pain, moderate, persistent, localized over the anterior and posterior joint line of the right shoulder without radiation.  Past medical history:  Negative.  See flowsheet/record as well for more information.  Surgical history: Negative.  See flowsheet/record as well for more information.  Family history: Negative.  See flowsheet/record as well for more information.  Social history: Negative.  See flowsheet/record as well for more information.  Allergies, and medications have been entered into the medical record, reviewed, and no changes needed.   Review of Systems: No fevers, chills, night sweats, weight loss, chest pain, or shortness of breath.   Objective:    General: Well Developed, well nourished, and in no acute distress.  Neuro: Alert and oriented x3, extra-ocular muscles intact, sensation grossly intact.  HEENT: Normocephalic, atraumatic, pupils equal round reactive to light, neck supple, no masses, no lymphadenopathy, thyroid nonpalpable.  Skin: Warm and dry, no rashes. Cardiac: Regular rate and rhythm, no murmurs rubs or gallops, no lower extremity edema.  Respiratory: Clear to auscultation bilaterally. Not using accessory muscles, speaking in full sentences.  Procedure: Real-time Ultrasound Guided Injection of right glenohumeral joint Device: GE Logiq E  Verbal informed consent obtained.  Time-out conducted.  Noted no overlying erythema, induration, or other signs of local infection.  Skin prepped in a sterile fashion.  Local anesthesia: Topical Ethyl chloride.  With sterile technique and under real time ultrasound guidance:  Using a 22-gauge spinal needle advanced into the joint and injected 1 mL Kenalog 40, 2 mL lidocaine, 2 mL  bupivacaine. Completed without difficulty  Pain immediately resolved suggesting accurate placement of the medication.  Advised to call if fevers/chills, erythema, induration, drainage, or persistent bleeding.  Images permanently stored and available for review in the ultrasound unit.  Impression: Technically successful ultrasound guided injection.  Impression and Recommendations:    Right shoulder pain with history of right proximal biceps rupture Repeat right glenohumeral injection, previous injection was 4 months ago.  ___________________________________________ [name]. Benjamin Stain, M.D., ABFM., CAQSM. Primary Care and Sports Medicine [facility]  Adjunct Instructor of Family Medicine  [employer]

## 2017-06-07 ENCOUNTER — Other Ambulatory Visit: Payer: Self-pay | Admitting: Sports Medicine

## 2017-06-07 DIAGNOSIS — I10 Essential (primary) hypertension: Secondary | ICD-10-CM

## 2017-07-26 ENCOUNTER — Other Ambulatory Visit: Payer: Self-pay | Admitting: Allergy and Immunology

## 2017-07-26 MED ORDER — RANITIDINE HCL 300 MG PO TABS: 300.0000 mg | ORAL_TABLET | Freq: Every day | ORAL | 0 refills | Status: DC

## 2017-07-26 NOTE — Telephone Encounter (Signed)
received fax for 90 day supply for Ranitidine 300mg . Patient was last seen 05/26/2017. Request sent in.

## 2017-08-10 ENCOUNTER — Ambulatory Visit: Payer: Medicare Other | Admitting: Allergy and Immunology

## 2017-08-10 ENCOUNTER — Encounter: Payer: Self-pay | Admitting: Allergy and Immunology

## 2017-08-10 VITALS — BP 126/82 | HR 60

## 2017-08-10 DIAGNOSIS — J4520 Mild intermittent asthma, uncomplicated: Secondary | ICD-10-CM

## 2017-08-10 DIAGNOSIS — J3089 Other allergic rhinitis: Secondary | ICD-10-CM | POA: Diagnosis not present

## 2017-08-10 DIAGNOSIS — K219 Gastro-esophageal reflux disease without esophagitis: Secondary | ICD-10-CM | POA: Diagnosis not present

## 2017-08-10 NOTE — Progress Notes (Signed)
Follow-up Note  Referring Provider: Georgann HousekeeperHusain, Karrar, MD Primary Provider: Georgann HousekeeperHusain, Karrar, MD Date of Office Visit: 08/10/2017  Subjective:   Alejandro Hicks (DOB: May 18, 1934) is a 83 y.o. male who [facility] in re-evaluation of the following:  HPI: Alejandro Hicks returns to this clinic in reevaluation of his LPR and allergic rhinitis and distant history of asthma. His last visit to this clinic was 26 May 2017.  He has done very well while consistently using aggressive therapy directed against reflux and most of his cough and throat issue has resolved.  Likewise, he has had very little problems with his upper airways.  He has had no issues with asthma and can exercise without any difficulty and does not use a short acting bronchodilator.  He believes that his Dexilant works better than his generic pantoprazole and he has gone back to utilizing Dexilant.  He continues on ranitidine.  He uses Flonase.  He rarely uses Xyzal or Azelastine or other symptomatic medications.  He did obtain a flu vaccine.  Allergies as of 08/10/2017      Reactions   Lipitor [atorvastatin]    Myalgia   Zocor [simvastatin]    Myalgia      Medication List      AEROCHAMBER PLUS FLO-VU LARGE Misc 1 each by Other route once.   albuterol 108 (90 Base) MCG/ACT inhaler Commonly known as:  PROAIR HFA Use 2 puffs every 4 hours as needed for cough and wheeze. May use 10-20 minutes before exercise   ALPRAZolam 0.25 MG tablet Commonly known as:  XANAX Take 0.25 mg by mouth 2 (two) times daily as needed for anxiety.   azelastine 0.1 % nasal spray Commonly known as:  ASTELIN Place 1 spray into both nostrils 2 (two) times daily.   DHEA PO Take by mouth daily.   fluticasone 50 MCG/ACT nasal spray Commonly known as:  FLONASE Place 1 spray into both nostrils 2 (two) times daily.   pantoprazole 40 MG tablet Commonly known as:  PROTONIX Take 1 tablet (40 mg total) by  mouth daily.   Pregnenolone Micronized Powd Use as directed in the mouth or throat daily.   ranitidine 300 MG tablet Commonly known as:  ZANTAC Take 1 tablet (300 mg total) by mouth at bedtime.   tadalafil 20 MG tablet Commonly known as:  CIALIS Take 5 mg by mouth daily as needed for erectile dysfunction.   testosterone cypionate 200 MG/ML injection Commonly known as:  DEPOTESTOSTERONE CYPIONATE Inject 0.2 mg into the muscle once a week.   valsartan 160 MG tablet Commonly known as:  DIOVAN TAKE 1 TABLET (160 MG TOTAL) BY MOUTH DAILY.   valsartan-hydrochlorothiazide 320-25 MG tablet Commonly known as:  DIOVAN-HCT TAKE 1 TABLET BY MOUTH DAILY.   Vitamin D (Cholecalciferol) 1000 units Tabs Take by mouth daily.   VITAMIN K2 PO Take by mouth daily.       Past Medical History:  Diagnosis Date  . Allergic rhinitis   . Anxiety   . Asthma   . BPH (benign prostatic hypertrophy)   . Colonic polyp   . Dyslipidemia   . ED (erectile dysfunction)   . GERD (gastroesophageal reflux disease)   . Inguinal hernia    Left S/P repair in 2012  . Leukoplakia of vocal cords   . OA (osteoarthritis)   . OSA (obstructive sleep apnea)    Does not use CPAP  . Reflux laryngitis    ENT  ecal, PPIs twice a day  . Rosacea     Past Surgical History:  Procedure Laterality Date  . COLONOSCOPY  2009  . INGUINAL HERNIA REPAIR Left    2012    Review of systems negative except as noted in HPI / PMHx or noted below:  Review of Systems  Constitutional: Negative.   HENT: Negative.   Eyes: Negative.   Respiratory: Negative.   Cardiovascular: Negative.   Gastrointestinal: Negative.   Genitourinary: Negative.   Musculoskeletal: Negative.   Skin: Negative.   Neurological: Negative.   Endo/Heme/Allergies: Negative.   Psychiatric/Behavioral: Negative.      Objective:   Vitals:   08/10/17 1422  BP: 126/82  Pulse: 60  SpO2: 96%          Physical Exam  Constitutional: He is  well-developed, well-nourished, and in no distress.  HENT:  Head: Normocephalic.  Right Ear: Tympanic membrane, external ear and ear canal normal.  Left Ear: Tympanic membrane, external ear and ear canal normal.  Nose: Nose normal. No mucosal edema or rhinorrhea.  Mouth/Throat: Uvula is midline, oropharynx is clear and moist and mucous membranes are normal. No oropharyngeal exudate.  Eyes: Conjunctivae are normal.  Neck: Trachea normal. No tracheal tenderness present. No tracheal deviation present. No thyromegaly present.  Cardiovascular: Normal rate, regular rhythm, S1 normal, S2 normal and normal heart sounds.  No murmur heard. Pulmonary/Chest: Breath sounds normal. No stridor. No respiratory distress. He has no wheezes. He has no rales.  Musculoskeletal: He exhibits no edema.  Lymphadenopathy:       Head (right side): No tonsillar adenopathy present.       Head (left side): No tonsillar adenopathy present.    He has no cervical adenopathy.  Neurological: He is alert. Gait normal.  Skin: No rash noted. He is not diaphoretic. No erythema. Nails show no clubbing.  Psychiatric: Mood and affect normal.    Diagnostics:    Spirometry was performed and demonstrated an FEV1 of 2.52 at 101 % of predicted.  The patient had an Asthma Control Test with the following results: ACT Total Score: 23.    Assessment and Plan:   1. Asthma, mild intermittent, well-controlled   2. Other allergic rhinitis   3. LPRD (laryngopharyngeal reflux disease)      1. Continue to Treat and prevent reflux:   A. Dexilant 60 mg one tablet one time per day in a.m.   B. ranitidine 300 mg one tablet one time per day in p.m.  C. keep head of bed elevated  D. consolidate all forms of caffeine consumption  E. minimize late meal consumption  2. Continue to Treat and prevent inflammation:   A. Flonase one spray each nostril twice a day  3. If needed:   A. Xyzal 5 mg tablet 1 time per day  B. ProAir HFA 2  puffs every 4-6 hours  C. Azelastine one spray each nostril twice a day   D. Nasal saline  4. Return to clinic in 6 months or earlier if problem  Alejandro Hicks is really doing very well on therapy directed against reflux and some anti-inflammatory medication for his respiratory tract and he will continue on this plan and I will assume he will continue to do well and see him back in this clinic in 6 months or earlier if there is a problem.  Laurette SchimkeEric Kozlow, MD Allergy / Immunology [facility]

## 2017-08-10 NOTE — Patient Instructions (Addendum)
    1. Continue to Treat and prevent reflux:   A. Dexilant 60 mg one tablet one time per day in a.m.   B. ranitidine 300 mg one tablet one time per day in p.m.  C. keep head of bed elevated  D. consolidate all forms of caffeine consumption  E. minimize late meal consumption  2. Continue to Treat and prevent inflammation:   A. Flonase one spray each nostril twice a day  3. If needed:   A. Xyzal 5 mg tablet 1 time per day  B. ProAir HFA 2 puffs every 4-6 hours  C. Azelastine one spray each nostril twice a day   D. Nasal saline  4. Return to clinic in 6 months or earlier if problem

## 2017-08-11 ENCOUNTER — Encounter: Payer: Self-pay | Admitting: Allergy and Immunology

## 2017-08-30 ENCOUNTER — Other Ambulatory Visit: Payer: Self-pay | Admitting: Allergy and Immunology

## 2017-09-07 ENCOUNTER — Other Ambulatory Visit: Payer: Self-pay

## 2017-09-07 MED ORDER — PANTOPRAZOLE SODIUM 40 MG PO TBEC: 40.0000 mg | DELAYED_RELEASE_TABLET | Freq: Every day | ORAL | 1 refills | Status: DC

## 2017-09-07 NOTE — Telephone Encounter (Signed)
Received fax for 90 day supply for Protonix. Patient was last seen 07/2017. Request sent in.

## 2017-10-01 ENCOUNTER — Ambulatory Visit: Payer: Medicare Other | Admitting: Sports Medicine

## 2017-10-01 ENCOUNTER — Encounter: Payer: Self-pay | Admitting: Sports Medicine

## 2017-10-01 DIAGNOSIS — M5412 Radiculopathy, cervical region: Secondary | ICD-10-CM

## 2017-10-01 DIAGNOSIS — S46211D Strain of muscle, fascia and tendon of other parts of biceps, right arm, subsequent encounter: Secondary | ICD-10-CM | POA: Diagnosis not present

## 2017-10-01 DIAGNOSIS — M7542 Impingement syndrome of left shoulder: Secondary | ICD-10-CM

## 2017-10-01 NOTE — Assessment & Plan Note (Signed)
Repeat right glenohumeral injection, previous injection was 4 months ago.

## 2017-10-01 NOTE — Assessment & Plan Note (Signed)
He is also having some periscapular radicular pain, adding some rehab exercises for this before considering an epidural.

## 2017-10-01 NOTE — Progress Notes (Signed)
Subjective:    I'm seeing this patient as a consultation for: Dr. Georgann Housekeeper  CC: Bilateral shoulder pain  HPI: Alejandro Hicks returns, he has known right glenohumeral osteoarthritis, this is responded well to occasional injections every 4-7 months or so.  He is having recurrence of right-sided pain and desires interventional treatment today, unfortunately he has developed a new pain in his left shoulder, localized over the deltoid and worse with overhead activities, moderate, persistent without radiation.  In addition, he has pain just medial to the left scapula, occasional radiation down to the fingertips, moderate, persistent without constitutional symptoms, no history of trauma.  I reviewed the past medical history, family history, social history, surgical history, and allergies today and no changes were needed.  Please see the problem list section below in epic for further details.  Past Medical History: Past Medical History:  Diagnosis Date  . Allergic rhinitis   . Anxiety   . Asthma   . BPH (benign prostatic hypertrophy)   . Colonic polyp   . Dyslipidemia   . ED (erectile dysfunction)   . GERD (gastroesophageal reflux disease)   . Inguinal hernia    Left S/P repair in 2012  . Leukoplakia of vocal cords   . OA (osteoarthritis)   . OSA (obstructive sleep apnea)    Does not use CPAP  . Reflux laryngitis    ENT ecal, PPIs twice a day  . Rosacea    Past Surgical History: Past Surgical History:  Procedure Laterality Date  . COLONOSCOPY  2009  . INGUINAL HERNIA REPAIR Left    2012   Social History: Social History   Socioeconomic History  . Marital status: Unknown    Spouse name: None  . Number of children: None  . Years of education: None  . Highest education level: None  Social Needs  . Financial resource strain: None  . Food insecurity - worry: None  . Food insecurity - inability: None  . Transportation needs - medical: None  . Transportation needs - non-medical:  None  Occupational History  . None  Tobacco Use  . Smoking status: Former Games developer  . Smokeless tobacco: Never Used  . Tobacco comment: Quit 1970  Substance and Sexual Activity  . Alcohol use: Yes    Comment: social  . Drug use: No  . Sexual activity: None  Other Topics Concern  . None  Social History Narrative  . None   Family History: No family history on file. Allergies: Allergies  Allergen Reactions  . Lipitor [Atorvastatin]     Myalgia  . Zocor [Simvastatin]     Myalgia   Medications: See med rec.  Review of Systems: No headache, visual changes, nausea, vomiting, diarrhea, constipation, dizziness, abdominal pain, skin rash, fevers, chills, night sweats, weight loss, swollen lymph nodes, body aches, joint swelling, muscle aches, chest pain, shortness of breath, mood changes, visual or auditory hallucinations.   Objective:   General: Well Developed, well nourished, and in no acute distress.  Neuro:  Extra-ocular muscles intact, able to move all 4 extremities, sensation grossly intact.  Deep tendon reflexes tested were normal. Psych: Alert and oriented, mood congruent with affect. ENT:  Ears and nose appear unremarkable.  Hearing grossly normal. Neck: Unremarkable overall appearance, trachea midline.  No visible thyroid enlargement. Eyes: Conjunctivae and lids appear unremarkable.  Pupils equal and round. Skin: Warm and dry, no rashes noted.  Cardiovascular: Pulses palpable, no extremity edema. Left shoulder: Inspection reveals no abnormalities, atrophy or asymmetry. Palpation  is normal with no tenderness over AC joint or bicipital groove. ROM is full in all planes. Rotator cuff strength normal throughout. Positive Neer and Hawkin's tests, empty can. Speeds and Yergason's tests normal. No labral pathology noted with negative Obrien's, negative crank, negative clunk, and good stability. Normal scapular function observed. No painful arc and no drop arm sign. No  apprehension sign Right shoulder: Inspection reveals no abnormalities, atrophy or asymmetry. Palpation is normal with no tenderness over AC joint or bicipital groove. ROM is full in all planes. Rotator cuff strength normal throughout. No signs of impingement with negative Neer and Hawkin's tests, empty can. Speeds and Yergason's tests normal. Positive Obrien's, negative crank, negative clunk, and good stability. Normal scapular function observed. No painful arc and no drop arm sign. No apprehension sign  Procedure: Real-time Ultrasound Guided Injection of right glenohumeral joint Device: GE Logiq E  Verbal informed consent obtained.  Time-out conducted.  Noted no overlying erythema, induration, or other signs of local infection.  Skin prepped in a sterile fashion.  Local anesthesia: Topical Ethyl chloride.  With sterile technique and under real time ultrasound guidance: Using a 22-gauge spinal needle and taking a posterior approach I advanced into the glenohumeral joint taking care to avoid the labrum, I then easily injected 1 cc kenalog 40, 2 cc lidocaine, 2 cc bupivacaine. Completed without difficulty  Pain immediately resolved suggesting accurate placement of the medication.  Advised to call if fevers/chills, erythema, induration, drainage, or persistent bleeding.  Images permanently stored and available for review in the ultrasound unit.  Impression: Technically successful ultrasound guided injection.  Procedure: Real-time Ultrasound Guided Injection of left subacromial bursa Device: GE Logiq E  Verbal informed consent obtained.  Time-out conducted.  Noted no overlying erythema, induration, or other signs of local infection.  Skin prepped in a sterile fashion.  Local anesthesia: Topical Ethyl chloride.  With sterile technique and under real time ultrasound guidance: Using a 25-gauge needle advanced into the subacromial bursa, taking care to avoid intratendinous injection I  injected 1 cc Kenalog 40, 1 cc lidocaine, 1 cc bupivacaine Completed without difficulty  Pain immediately resolved suggesting accurate placement of the medication.  Advised to call if fevers/chills, erythema, induration, drainage, or persistent bleeding.  Images permanently stored and available for review in the ultrasound unit.  Impression: Technically successful ultrasound guided injection.  Impression and Recommendations:   This case required medical decision making of moderate complexity.  Shoulder impingement syndrome, left Subacromial injection per patient request. Rehab exercises given.   Right shoulder pain with history of right proximal biceps rupture Repeat right glenohumeral injection, previous injection was 4 months ago.  Right cervical radiculopathy He is also having some periscapular radicular pain, adding some rehab exercises for this before considering an epidural. ___________________________________________ [name], M.D., ABFM., CAQSM. Primary Care and Sports Medicine  [facility]  Adjunct Instructor of Family Medicine  [employer]

## 2017-10-01 NOTE — Assessment & Plan Note (Signed)
Subacromial injection per patient request. Rehab exercises given.

## 2017-10-03 ENCOUNTER — Other Ambulatory Visit: Payer: Self-pay | Admitting: Allergy and Immunology

## 2017-10-03 DIAGNOSIS — K219 Gastro-esophageal reflux disease without esophagitis: Secondary | ICD-10-CM

## 2017-10-04 ENCOUNTER — Ambulatory Visit
Admission: RE | Admit: 2017-10-04 | Discharge: 2017-10-04 | Disposition: A | Discharge status: Home / Self Care | Payer: Medicare Other | Source: Ambulatory Visit | Attending: Internal Medicine | Admitting: Internal Medicine

## 2017-10-04 ENCOUNTER — Other Ambulatory Visit: Payer: Self-pay | Admitting: Internal Medicine

## 2017-10-04 DIAGNOSIS — R05 Cough: Secondary | ICD-10-CM

## 2017-10-04 DIAGNOSIS — R059 Cough, unspecified: Secondary | ICD-10-CM

## 2017-10-18 ENCOUNTER — Other Ambulatory Visit: Payer: Self-pay

## 2017-10-18 MED ORDER — RANITIDINE HCL 300 MG PO TABS: 300.0000 mg | ORAL_TABLET | Freq: Every day | ORAL | 0 refills | Status: DC

## 2017-10-18 NOTE — Telephone Encounter (Signed)
RF on ranitidine 300mg  x 1 (90 day supply) with no refills at [facility]

## 2017-12-23 ENCOUNTER — Other Ambulatory Visit: Payer: Self-pay | Admitting: Allergy and Immunology

## 2017-12-23 DIAGNOSIS — K219 Gastro-esophageal reflux disease without esophagitis: Secondary | ICD-10-CM

## 2018-01-12 ENCOUNTER — Encounter: Payer: Self-pay | Admitting: Sports Medicine

## 2018-01-12 ENCOUNTER — Ambulatory Visit: Payer: Medicare Other | Admitting: Sports Medicine

## 2018-01-12 DIAGNOSIS — S46211D Strain of muscle, fascia and tendon of other parts of biceps, right arm, subsequent encounter: Secondary | ICD-10-CM

## 2018-01-12 DIAGNOSIS — M5412 Radiculopathy, cervical region: Secondary | ICD-10-CM

## 2018-01-12 NOTE — Progress Notes (Signed)
Subjective:    CC: Shoulder pain  HPI: This is a very pleasant 84 year old male, I have seen him multiple times in the past for bilateral shoulder pain, 3-1/2 months ago we did a right glenohumeral and left subacromial injection.  This worked extremely well on the right, his left subacromial and impingement symptoms resolved but he is having some pain medial to the scapula on the left side.    I reviewed the past medical history, family history, social history, surgical history, and allergies today and no changes were needed.  Please see the problem list section below in epic for further details.  Past Medical History: Past Medical History:  Diagnosis Date  . Allergic rhinitis   . Anxiety   . Asthma   . BPH (benign prostatic hypertrophy)   . Colonic polyp   . Dyslipidemia   . ED (erectile dysfunction)   . GERD (gastroesophageal reflux disease)   . Inguinal hernia    Left S/P repair in 2012  . Leukoplakia of vocal cords   . OA (osteoarthritis)   . OSA (obstructive sleep apnea)    Does not use CPAP  . Reflux laryngitis    ENT ecal, PPIs twice a day  . Rosacea    Past Surgical History: Past Surgical History:  Procedure Laterality Date  . COLONOSCOPY  2009  . INGUINAL HERNIA REPAIR Left    2012   Social History: Social History   Socioeconomic History  . Marital status: Single    Spouse name: Not on file  . Number of children: Not on file  . Years of education: Not on file  . Highest education level: Not on file  Occupational History  . Not on file  Social Needs  . Financial resource strain: Not on file  . Food insecurity:    Worry: Not on file    Inability: Not on file  . Transportation needs:    Medical: Not on file    Non-medical: Not on file  Tobacco Use  . Smoking status: Former Games developer  . Smokeless tobacco: Never Used  . Tobacco comment: Quit 1970  Substance and Sexual Activity  . Alcohol use: Yes    Comment: social  . Drug use: No  . Sexual activity:  Not on file  Lifestyle  . Physical activity:    Days per week: Not on file    Minutes per session: Not on file  . Stress: Not on file  Relationships  . Social connections:    Talks on phone: Not on file    Gets together: Not on file    Attends religious service: Not on file    Active member of club or organization: Not on file    Attends meetings of clubs or organizations: Not on file    Relationship status: Not on file  Other Topics Concern  . Not on file  Social History Narrative  . Not on file   Family History: No family history on file. Allergies: Allergies  Allergen Reactions  . Lipitor [Atorvastatin]     Myalgia  . Zocor [Simvastatin]     Myalgia   Medications: See med rec.  Review of Systems: No fevers, chills, night sweats, weight loss, chest pain, or shortness of breath.   Objective:    General: Well Developed, well nourished, and in no acute distress.  Neuro: Alert and oriented x3, extra-ocular muscles intact, sensation grossly intact.  HEENT: Normocephalic, atraumatic, pupils equal round reactive to light, neck supple, no masses, no  lymphadenopathy, thyroid nonpalpable.  Skin: Warm and dry, no rashes. Cardiac: Regular rate and rhythm, no murmurs rubs or gallops, no lower extremity edema.  Respiratory: Clear to auscultation bilaterally. Not using accessory muscles, speaking in full sentences. Neck: Negative spurling's Full neck range of motion Grip strength and sensation normal in bilateral hands Strength good C4 to T1 distribution No sensory change to C4 to T1 Reflexes normal Left periscapular trigger point, rhomboid major.  Procedure: Real-time Ultrasound Guided Injection of right glenohumeral joint Device: GE Logiq E  Verbal informed consent obtained.  Time-out conducted.  Noted no overlying erythema, induration, or other signs of local infection.  Skin prepped in a sterile fashion.  Local anesthesia: Topical Ethyl chloride.  With sterile  technique and under real time ultrasound guidance: I advanced a 22-gauge spinal needle into the joint and injected 1 cc Kenalog 40, 2 cc lidocaine, 2 cc bupivacaine. Completed without difficulty  Pain immediately resolved suggesting accurate placement of the medication.  Advised to call if fevers/chills, erythema, induration, drainage, or persistent bleeding.  Images permanently stored and available for review in the ultrasound unit.  Impression: Technically successful ultrasound guided injection.  Procedure:  Injection of left rhomboid major trigger point Consent obtained and verified. Time-out conducted. Noted no overlying erythema, induration, or other signs of local infection. Skin prepped in a sterile fashion. Topical analgesic spray: Ethyl chloride. Completed without difficulty. Meds: 1 cc Kenalog 40, 1 cc lidocaine injected in a fanlike pattern. Pain immediately improved suggesting accurate placement of the medication. Advised to call if fevers/chills, erythema, induration, drainage, or persistent bleeding.  Impression and Recommendations:    Right shoulder pain with history of right proximal biceps rupture Right glenohumeral injection, previous injection was 2-1/2 months ago.  Right cervical radiculopathy Having some left-sided periscapular radicular pain, trigger point injection as above.  Return as needed for this. ___________________________________________ [name]. Benjamin Stain, M.D., ABFM., CAQSM. Primary Care and Sports Medicine [place] [facility]  Adjunct Instructor of Family Medicine  [employer]

## 2018-01-12 NOTE — Assessment & Plan Note (Signed)
Having some left-sided periscapular radicular pain, trigger point injection as above.  Return as needed for this.

## 2018-01-12 NOTE — Assessment & Plan Note (Signed)
Right glenohumeral injection, previous injection was 2-1/2 months ago.

## 2018-01-18 ENCOUNTER — Other Ambulatory Visit: Payer: Self-pay | Admitting: Allergy and Immunology

## 2018-01-19 ENCOUNTER — Ambulatory Visit (INDEPENDENT_AMBULATORY_CARE_PROVIDER_SITE_OTHER): Payer: Medicare Other

## 2018-01-19 ENCOUNTER — Ambulatory Visit: Payer: Medicare Other | Admitting: Sports Medicine

## 2018-01-19 DIAGNOSIS — M19071 Primary osteoarthritis, right ankle and foot: Secondary | ICD-10-CM | POA: Diagnosis not present

## 2018-01-19 NOTE — Assessment & Plan Note (Signed)
Pain at the lateral talar dome and fibular articulation/sinus tarsi syndrome. Gelling consistent with osteoarthritis, lateral talofibular injection. X-rays. Return to see me in 1 month, no restrictions for his golf tournament coming up.

## 2018-01-19 NOTE — Progress Notes (Signed)
Subjective:    CC: Right ankle pain  HPI: Alejandro Hicks is a pleasant 84 year old male, for the past several months he said increasing pain in his right ankle at the sinus tarsi, worse when initially on his feet but tends to improve as he walks.  Moderate, persistent, localized without radiation.  Does have a big golf tournament coming up this weekend and would like aggressive treatment today.  I reviewed the past medical history, family history, social history, surgical history, and allergies today and no changes were needed.  Please see the problem list section below in epic for further details.  Past Medical History: Past Medical History:  Diagnosis Date  . Allergic rhinitis   . Anxiety   . Asthma   . BPH (benign prostatic hypertrophy)   . Colonic polyp   . Dyslipidemia   . ED (erectile dysfunction)   . GERD (gastroesophageal reflux disease)   . Inguinal hernia    Left S/P repair in 2012  . Leukoplakia of vocal cords   . OA (osteoarthritis)   . OSA (obstructive sleep apnea)    Does not use CPAP  . Reflux laryngitis    ENT ecal, PPIs twice a day  . Rosacea    Past Surgical History: Past Surgical History:  Procedure Laterality Date  . COLONOSCOPY  2009  . INGUINAL HERNIA REPAIR Left    2012   Social History: Social History   Socioeconomic History  . Marital status: Single    Spouse name: Not on file  . Number of children: Not on file  . Years of education: Not on file  . Highest education level: Not on file  Occupational History  . Not on file  Social Needs  . Financial resource strain: Not on file  . Food insecurity:    Worry: Not on file    Inability: Not on file  . Transportation needs:    Medical: Not on file    Non-medical: Not on file  Tobacco Use  . Smoking status: Former Games developer  . Smokeless tobacco: Never Used  . Tobacco comment: Quit 1970  Substance and Sexual Activity  . Alcohol use: Yes    Comment: social  . Drug use: No  . Sexual activity: Not on  file  Lifestyle  . Physical activity:    Days per week: Not on file    Minutes per session: Not on file  . Stress: Not on file  Relationships  . Social connections:    Talks on phone: Not on file    Gets together: Not on file    Attends religious service: Not on file    Active member of club or organization: Not on file    Attends meetings of clubs or organizations: Not on file    Relationship status: Not on file  Other Topics Concern  . Not on file  Social History Narrative  . Not on file   Family History: No family history on file. Allergies: Allergies  Allergen Reactions  . Lipitor [Atorvastatin]     Myalgia  . Zocor [Simvastatin]     Myalgia   Medications: See med rec.  Review of Systems: No fevers, chills, night sweats, weight loss, chest pain, or shortness of breath.   Objective:    General: Well Developed, well nourished, and in no acute distress.  Neuro: Alert and oriented x3, extra-ocular muscles intact, sensation grossly intact.  HEENT: Normocephalic, atraumatic, pupils equal round reactive to light, neck supple, no masses, no lymphadenopathy, thyroid nonpalpable.  Skin: Warm and dry, no rashes. Cardiac: Regular rate and rhythm, no murmurs rubs or gallops, no lower extremity edema.  Respiratory: Clear to auscultation bilaterally. Not using accessory muscles, speaking in full sentences. Right ankle: No visible erythema or swelling. Range of motion is full in all directions. Strength is 5/5 in all directions. Stable lateral and medial ligaments; squeeze test and kleiger test unremarkable; Palpation of the lateral talar dome, at the articulation with the fibula. No pain at base of 5th MT; No tenderness over cuboid; No tenderness over N spot or navicular prominence No tenderness on posterior aspects of lateral and medial malleolus No sign of peroneal tendon subluxations; Negative tarsal tunnel tinel's Able to walk 4 steps.  Procedure: Real-time Ultrasound  Guided Injection of right sinus tarsi/talofibular articulation Device: GE Logiq E  Verbal informed consent obtained.  Time-out conducted.  Noted no overlying erythema, induration, or other signs of local infection.  Skin prepped in a sterile fashion.  Local anesthesia: Topical Ethyl chloride.  With sterile technique and under real time ultrasound guidance: 1 cc kenalog 40, 1 cc lidocaine, 1 cc bupivacaine injected easily Completed without difficulty  Pain immediately resolved suggesting accurate placement of the medication.  Advised to call if fevers/chills, erythema, induration, drainage, or persistent bleeding.  Images permanently stored and available for review in the ultrasound unit.  Impression: Technically successful ultrasound guided injection.  Impression and Recommendations:    Primary osteoarthritis of right ankle Pain at the lateral talar dome and fibular articulation/sinus tarsi syndrome. Gelling consistent with osteoarthritis, lateral talofibular injection. X-rays. Return to see me in 1 month, no restrictions for his golf tournament coming up. ___________________________________________ [name]. Benjamin Stain, M.D., ABFM., CAQSM. Primary Care and Sports Medicine [place] [facility]  Adjunct Instructor of Family Medicine  [employer]

## 2018-01-25 ENCOUNTER — Telehealth: Payer: Self-pay

## 2018-01-25 NOTE — Telephone Encounter (Signed)
Pt left VM stating his having inflammation and pain in his right heel. Pt states pain is not where he received the injection and he can not put pressure on the foot. Would like to know what he can do at this time for the pain. Please advise.

## 2018-01-25 NOTE — Telephone Encounter (Signed)
Tough to know what it is and how to treat it, have him come see me so we can figure this out.

## 2018-01-26 NOTE — Telephone Encounter (Signed)
Pt.notified

## 2018-02-16 ENCOUNTER — Ambulatory Visit: Payer: Medicare Other | Admitting: Sports Medicine

## 2018-02-16 ENCOUNTER — Encounter: Payer: Self-pay | Admitting: Sports Medicine

## 2018-02-16 DIAGNOSIS — M19071 Primary osteoarthritis, right ankle and foot: Secondary | ICD-10-CM

## 2018-02-16 DIAGNOSIS — M722 Plantar fascial fibromatosis: Secondary | ICD-10-CM | POA: Diagnosis not present

## 2018-02-16 NOTE — Progress Notes (Signed)
Subjective:    CC: Right foot pain  HPI: Alejandro ClicheBobby returns, his ankle feels better, we did a fibular/talar articulation injection at the last visit.  He does have an increase in pain on the plantar aspect of his right heel, worse with the first few steps in the morning and after being in a seated position.  Also has a complaint over the shaft of the fourth metatarsal worse with weightbearing.  All symptoms are moderate, persistent.  I reviewed the past medical history, family history, social history, surgical history, and allergies today and no changes were needed.  Please see the problem list section below in epic for further details.  Past Medical History: Past Medical History:  Diagnosis Date  . Allergic rhinitis   . Anxiety   . Asthma   . BPH (benign prostatic hypertrophy)   . Colonic polyp   . Dyslipidemia   . ED (erectile dysfunction)   . GERD (gastroesophageal reflux disease)   . Inguinal hernia    Left S/P repair in 2012  . Leukoplakia of vocal cords   . OA (osteoarthritis)   . OSA (obstructive sleep apnea)    Does not use CPAP  . Reflux laryngitis    ENT ecal, PPIs twice a day  . Rosacea    Past Surgical History: Past Surgical History:  Procedure Laterality Date  . COLONOSCOPY  2009  . INGUINAL HERNIA REPAIR Left    2012   Social History: Social History   Socioeconomic History  . Marital status: Single    Spouse name: Not on file  . Number of children: Not on file  . Years of education: Not on file  . Highest education level: Not on file  Occupational History  . Not on file  Social Needs  . Financial resource strain: Not on file  . Food insecurity:    Worry: Not on file    Inability: Not on file  . Transportation needs:    Medical: Not on file    Non-medical: Not on file  Tobacco Use  . Smoking status: Former Games developermoker  . Smokeless tobacco: Never Used  . Tobacco comment: Quit 1970  Substance and Sexual Activity  . Alcohol use: Yes    Comment: social    . Drug use: No  . Sexual activity: Not on file  Lifestyle  . Physical activity:    Days per week: Not on file    Minutes per session: Not on file  . Stress: Not on file  Relationships  . Social connections:    Talks on phone: Not on file    Gets together: Not on file    Attends religious service: Not on file    Active member of club or organization: Not on file    Attends meetings of clubs or organizations: Not on file    Relationship status: Not on file  Other Topics Concern  . Not on file  Social History Narrative  . Not on file   Family History: No family history on file. Allergies: Allergies  Allergen Reactions  . Lipitor [Atorvastatin]     Myalgia  . Zocor [Simvastatin]     Myalgia   Medications: See med rec.  Review of Systems: No fevers, chills, night sweats, weight loss, chest pain, or shortness of breath.   Objective:    General: Well Developed, well nourished, and in no acute distress.  Neuro: Alert and oriented x3, extra-ocular muscles intact, sensation grossly intact.  HEENT: Normocephalic, atraumatic, pupils equal round  reactive to light, neck supple, no masses, no lymphadenopathy, thyroid nonpalpable.  Skin: Warm and dry, no rashes. Cardiac: Regular rate and rhythm, no murmurs rubs or gallops, no lower extremity edema.  Respiratory: Clear to auscultation bilaterally. Not using accessory muscles, speaking in full sentences. Right foot: No visible erythema or swelling. Range of motion is full in all directions. Strength is 5/5 in all directions. No hallux valgus. No pes cavus or pes planus. No abnormal callus noted. No pain over the navicular prominence, or base of fifth metatarsal. Moderate tenderness to palpation of the calcaneal insertion of plantar fascia. No pain at the Achilles insertion. No pain over the calcaneal bursa. No pain of the retrocalcaneal bursa. Mild tenderness over the fourth metatarsal shaft with palpable bony callus. No hallux  rigidus or limitus. No tenderness palpation over interphalangeal joints. No pain with compression of the metatarsal heads. Neurovascularly intact distally.  Procedure: Real-time Ultrasound Guided Injection of right plantar fascia origin Device: GE Logiq E  Verbal informed consent obtained.  Time-out conducted.  Noted no overlying erythema, induration, or other signs of local infection.  Skin prepped in a sterile fashion.  Local anesthesia: Topical Ethyl chloride.  With sterile technique and under real time ultrasound guidance: 1 cc Kenalog 40, 1 cc lidocaine, 1 cc bupivacaine injected easily Completed without difficulty  Pain immediately resolved suggesting accurate placement of the medication.  Advised to call if fevers/chills, erythema, induration, drainage, or persistent bleeding.  Images permanently stored and available for review in the ultrasound unit.  Impression: Technically successful ultrasound guided injection.  Impression and Recommendations:    Plantar fasciitis, right Injection as above. Return for custom orthotics, he does have a fourth metatarsal stress reaction as well.  Primary osteoarthritis of right ankle Resolved after injection. ___________________________________________ [name]. Benjamin Stain, M.D., ABFM., CAQSM. Primary Care and Sports Medicine [place] [facility]  Adjunct Instructor of Family Medicine  [employer]

## 2018-02-16 NOTE — Assessment & Plan Note (Signed)
Resolved after injection 

## 2018-02-16 NOTE — Assessment & Plan Note (Signed)
Injection as above. Return for custom orthotics, he does have a fourth metatarsal stress reaction as well.

## 2018-02-21 ENCOUNTER — Ambulatory Visit (INDEPENDENT_AMBULATORY_CARE_PROVIDER_SITE_OTHER): Payer: Medicare Other | Admitting: Sports Medicine

## 2018-02-21 ENCOUNTER — Encounter: Payer: Self-pay | Admitting: Sports Medicine

## 2018-02-21 DIAGNOSIS — M722 Plantar fascial fibromatosis: Secondary | ICD-10-CM

## 2018-02-21 NOTE — Progress Notes (Signed)
    Patient was fitted for a : standard, cushioned, semi-rigid orthotic. The orthotic was heated and afterward the patient stood on the orthotic blank positioned on the orthotic stand. The patient was positioned in subtalar neutral position and 10 degrees of ankle dorsiflexion in a weight bearing stance. After completion of molding, a stable base was applied to the orthotic blank. The blank was ground to a stable position for weight bearing. Size: 10 Base: White EVA Additional Posting and Padding: None The patient ambulated these, and they were very comfortable.  I spent 40 minutes with this patient, greater than 50% was face-to-face time counseling regarding the below diagnosis.  ___________________________________________ Thomas J. Thekkekandam, M.D., ABFM., CAQSM. Primary Care and Sports Medicine [facility]  Adjunct Instructor of Family Medicine  [employer]   

## 2018-02-21 NOTE — Assessment & Plan Note (Addendum)
Custom orthotics as above.   Return in 1 month.

## 2018-02-28 ENCOUNTER — Other Ambulatory Visit: Payer: Self-pay | Admitting: Allergy and Immunology

## 2018-02-28 DIAGNOSIS — K219 Gastro-esophageal reflux disease without esophagitis: Secondary | ICD-10-CM

## 2018-03-14 ENCOUNTER — Other Ambulatory Visit: Payer: Self-pay | Admitting: Allergy and Immunology

## 2018-03-14 DIAGNOSIS — K219 Gastro-esophageal reflux disease without esophagitis: Secondary | ICD-10-CM

## 2018-03-28 ENCOUNTER — Ambulatory Visit: Payer: Medicare Other | Admitting: Sports Medicine

## 2018-03-28 ENCOUNTER — Encounter: Payer: Self-pay | Admitting: Sports Medicine

## 2018-03-28 DIAGNOSIS — M79672 Pain in left foot: Secondary | ICD-10-CM | POA: Diagnosis not present

## 2018-03-28 DIAGNOSIS — M722 Plantar fascial fibromatosis: Secondary | ICD-10-CM | POA: Diagnosis not present

## 2018-03-28 MED ORDER — PREDNISONE 50 MG PO TABS: ORAL_TABLET | ORAL | 0 refills | Status: DC

## 2018-03-28 NOTE — Progress Notes (Signed)
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  Unfortunately he is starting to have a recurrence of pain here.  He also has discomfort over the fourth metatarsal shaft suspected to be a stress injury, activity modification and custom orthotics have also not been efficacious.  I reviewed the past medical history, family history, social history, surgical history, and allergies today and no changes were needed.  Please see the problem list section below in epic for further details.  Past Medical History: Past Medical History:  Diagnosis Date  . Allergic rhinitis   . Anxiety   . Asthma   . BPH (benign prostatic hypertrophy)   . Colonic polyp   . Dyslipidemia   . ED (erectile dysfunction)   . GERD (gastroesophageal reflux disease)   . Inguinal hernia    Left S/P repair in 2012  . Leukoplakia of vocal cords   . OA (osteoarthritis)   . OSA (obstructive sleep apnea)    Does not use CPAP  . Reflux laryngitis    ENT ecal, PPIs twice a day  . Rosacea    Past Surgical History: Past Surgical History:  Procedure Laterality Date  . COLONOSCOPY  2009  . INGUINAL HERNIA REPAIR Left    2012   Social History: Social History   Socioeconomic History  . Marital status: Single    Spouse name: Not on file  . Number of children: Not on file  . Years of education: Not on file  . Highest education level: Not on file  Occupational History  . Not on file  Social Needs  . Financial resource strain: Not on file  . Food insecurity:    Worry: Not on file    Inability: Not on file  . Transportation needs:    Medical: Not on file    Non-medical: Not on file  Tobacco Use  . Smoking status: Former Games developer  . Smokeless tobacco: Never Used  . Tobacco comment: Quit 1970    Substance and Sexual Activity  . Alcohol use: Yes    Comment: social  . Drug use: No  . Sexual activity: Not on file  Lifestyle  . Physical activity:    Days per week: Not on file    Minutes per session: Not on file  . Stress: Not on file  Relationships  . Social connections:    Talks on phone: Not on file    Gets together: Not on file    Attends religious service: Not on file    Active member of club or organization: Not on file    Attends meetings of clubs or organizations: Not on file    Relationship status: Not on file  Other Topics Concern  . Not on file  Social History Narrative  . Not on file   Family History: No family history on file. Allergies: Allergies  Allergen Reactions  . Lipitor [Atorvastatin]     Myalgia  . Zocor [Simvastatin]     Myalgia   Medications: See med rec.  Review of Systems: No fevers, chills, night sweats, weight loss, chest pain, or shortness of breath.   Objective:    General: Well Developed, well nourished, and in no acute distress.  Neuro: Alert and oriented x3, extra-ocular muscles intact, sensation grossly intact.  HEENT: Normocephalic,  atraumatic, pupils equal round reactive to light, neck supple, no masses, no lymphadenopathy, thyroid nonpalpable.  Skin: Warm and dry, no rashes. Cardiac: Regular rate and rhythm, no murmurs rubs or gallops, no lower extremity edema.  Respiratory: Clear to auscultation bilaterally. Not using accessory muscles, speaking in full sentences. Left foot: No visible erythema or swelling. Range of motion is full in all directions. Strength is 5/5 in all directions. No hallux valgus. No pes cavus or pes planus. No abnormal callus noted. No pain over the navicular prominence, or base of fifth metatarsal. No pain at the Achilles insertion. No pain over the calcaneal bursa. No pain of the retrocalcaneal bursa. Tender palpation over the fourth metatarsal shaft, minimal tenderness at the plantar fascia  origin. No hallux rigidus or limitus. No tenderness palpation over interphalangeal joints. No pain with compression of the metatarsal heads. Neurovascularly intact distally.  Impression and Recommendations:    Plantar fasciitis, right Persistent pain in spite of custom orthotics, plantar fascia injection. Adding a postop shoe, he does have some fourth metatarsal shaft pain consistent with a stress injury. Because he also has some numbness on the outer 2 toes were going to add a burst of prednisone. MRI to make sure I am not missing something.  ___________________________________________ [name], M.D., ABFM., CAQSM. Primary Care and Sports Medicine [place] [facility]  Adjunct Instructor of Family Medicine  [employer]

## 2018-03-28 NOTE — Assessment & Plan Note (Signed)
Persistent pain in spite of custom orthotics, plantar fascia injection. Adding a postop shoe, he does have some fourth metatarsal shaft pain consistent with a stress injury. Because he also has some numbness on the outer 2 toes were going to add a burst of prednisone. MRI to make sure I am not missing something.

## 2018-04-04 ENCOUNTER — Other Ambulatory Visit: Payer: Self-pay | Admitting: Sports Medicine

## 2018-04-04 ENCOUNTER — Ambulatory Visit (INDEPENDENT_AMBULATORY_CARE_PROVIDER_SITE_OTHER): Payer: Medicare Other

## 2018-04-04 DIAGNOSIS — M722 Plantar fascial fibromatosis: Secondary | ICD-10-CM

## 2018-04-04 DIAGNOSIS — M79672 Pain in left foot: Secondary | ICD-10-CM

## 2018-04-10 ENCOUNTER — Other Ambulatory Visit: Payer: Self-pay | Admitting: Sports Medicine

## 2018-04-10 DIAGNOSIS — I10 Essential (primary) hypertension: Secondary | ICD-10-CM

## 2018-04-12 ENCOUNTER — Ambulatory Visit (INDEPENDENT_AMBULATORY_CARE_PROVIDER_SITE_OTHER): Payer: Medicare Other | Admitting: Sports Medicine

## 2018-04-12 VITALS — BP 139/68 | HR 62 | Ht 64.0 in | Wt 160.0 lb

## 2018-04-12 DIAGNOSIS — M722 Plantar fascial fibromatosis: Secondary | ICD-10-CM | POA: Diagnosis not present

## 2018-04-12 NOTE — Progress Notes (Signed)
Patient presents to clinic for boot fitting. Patient was instructed how to wear the boot and did not have any further questions. Patient walked around in the office to get a feel of walking with a boot and he felt good walking and got his balance after a some steps. Patient was asked to call back with any questions.

## 2018-04-26 ENCOUNTER — Ambulatory Visit (INDEPENDENT_AMBULATORY_CARE_PROVIDER_SITE_OTHER): Payer: Medicare Other | Admitting: Sports Medicine

## 2018-04-26 ENCOUNTER — Encounter: Payer: Self-pay | Admitting: Sports Medicine

## 2018-04-26 ENCOUNTER — Telehealth: Payer: Self-pay | Admitting: Hematology

## 2018-04-26 DIAGNOSIS — M722 Plantar fascial fibromatosis: Secondary | ICD-10-CM | POA: Diagnosis not present

## 2018-04-26 NOTE — Assessment & Plan Note (Signed)
Had persistent pain in spite of custom orthotics and a plantar fascia injection. He is now been in a boot for 2 weeks, and is pain-free with regards to his plantar fascia. He does have some numbness on the lateral foot, as well as achiness on the anterolateral lower leg likely radicular in origin though this is not bothering him very much. We are going to put this on the back burner and he will come back to see me if needed. Next step would be nerve conduction/EMG followed by lumbar spine MRI of determined to be radicular.

## 2018-04-26 NOTE — Telephone Encounter (Signed)
Faxed medical records to [employer]: [employer], Release ID: [id number]

## 2018-04-26 NOTE — Progress Notes (Signed)
Subjective:    CC: Recheck foot  HPI: Alejandro Hicks is a pleasant 84 year old male, we have been treating him for multifactorial foot pain, predominantly plantar fasciitis, he failed injections, custom orthotics, physical therapy, at the last visit we placed him in a boot and he returns today pain-free with regards to his heel pain, he does have a bit of numbness and paresthesias on the lateral foot as well as the anterolateral lower leg but nothing that keeps him from playing golf.  I reviewed the past medical history, family history, social history, surgical history, and allergies today and no changes were needed.  Please see the problem list section below in epic for further details.  Past Medical History: Past Medical History:  Diagnosis Date  . Allergic rhinitis   . Anxiety   . Asthma   . BPH (benign prostatic hypertrophy)   . Colonic polyp   . Dyslipidemia   . ED (erectile dysfunction)   . GERD (gastroesophageal reflux disease)   . Inguinal hernia    Left S/P repair in 2012  . Leukoplakia of vocal cords   . OA (osteoarthritis)   . OSA (obstructive sleep apnea)    Does not use CPAP  . Reflux laryngitis    ENT ecal, PPIs twice a day  . Rosacea    Past Surgical History: Past Surgical History:  Procedure Laterality Date  . COLONOSCOPY  2009  . INGUINAL HERNIA REPAIR Left    2012   Social History: Social History   Socioeconomic History  . Marital status: Single    Spouse name: Not on file  . Number of children: Not on file  . Years of education: Not on file  . Highest education level: Not on file  Occupational History  . Not on file  Social Needs  . Financial resource strain: Not on file  . Food insecurity:    Worry: Not on file    Inability: Not on file  . Transportation needs:    Medical: Not on file    Non-medical: Not on file  Tobacco Use  . Smoking status: Former Games developer  . Smokeless tobacco: Never Used  . Tobacco comment: Quit 1970  Substance and Sexual  Activity  . Alcohol use: Yes    Comment: social  . Drug use: No  . Sexual activity: Not on file  Lifestyle  . Physical activity:    Days per week: Not on file    Minutes per session: Not on file  . Stress: Not on file  Relationships  . Social connections:    Talks on phone: Not on file    Gets together: Not on file    Attends religious service: Not on file    Active member of club or organization: Not on file    Attends meetings of clubs or organizations: Not on file    Relationship status: Not on file  Other Topics Concern  . Not on file  Social History Narrative  . Not on file   Family History: No family history on file. Allergies: Allergies  Allergen Reactions  . Lipitor [Atorvastatin]     Myalgia  . Zocor [Simvastatin]     Myalgia   Medications: See med rec.  Review of Systems: No fevers, chills, night sweats, weight loss, chest pain, or shortness of breath.   Objective:    General: Well Developed, well nourished, and in no acute distress.  Neuro: Alert and oriented x3, extra-ocular muscles intact, sensation grossly intact.  HEENT: Normocephalic, atraumatic,  pupils equal round reactive to light, neck supple, no masses, no lymphadenopathy, thyroid nonpalpable.  Skin: Warm and dry, no rashes. Cardiac: Regular rate and rhythm, no murmurs rubs or gallops, no lower extremity edema.  Respiratory: Clear to auscultation bilaterally. Not using accessory muscles, speaking in full sentences. Right foot: No visible erythema or swelling. Range of motion is full in all directions. Strength is 5/5 in all directions. No hallux valgus. No pes cavus or pes planus. No abnormal callus noted. No pain over the navicular prominence, or base of fifth metatarsal. No tenderness to palpation of the calcaneal insertion of plantar fascia. No pain at the Achilles insertion. No pain over the calcaneal bursa. No pain of the retrocalcaneal bursa. No tenderness to palpation over the  tarsals, metatarsals, or phalanges. No hallux rigidus or limitus. No tenderness palpation over interphalangeal joints. No pain with compression of the metatarsal heads. Neurovascularly intact distally.  Impression and Recommendations:    Plantar fasciitis, right Had persistent pain in spite of custom orthotics and a plantar fascia injection. He is now been in a boot for 2 weeks, and is pain-free with regards to his plantar fascia. He does have some numbness on the lateral foot, as well as achiness on the anterolateral lower leg likely radicular in origin though this is not bothering him very much. We are going to put this on the back burner and he will come back to see me if needed. Next step would be nerve conduction/EMG followed by lumbar spine MRI of determined to be radicular. ___________________________________________ [name]. Benjamin Stain, M.D., ABFM., CAQSM. Primary Care and Sports Medicine [place] [facility]  Adjunct Instructor of Family Medicine  [employer]

## 2018-05-20 ENCOUNTER — Ambulatory Visit (INDEPENDENT_AMBULATORY_CARE_PROVIDER_SITE_OTHER): Payer: Medicare Other | Admitting: Sports Medicine

## 2018-05-20 DIAGNOSIS — M25511 Pain in right shoulder: Secondary | ICD-10-CM

## 2018-05-20 DIAGNOSIS — S46211D Strain of muscle, fascia and tendon of other parts of biceps, right arm, subsequent encounter: Secondary | ICD-10-CM

## 2018-05-20 DIAGNOSIS — M5412 Radiculopathy, cervical region: Secondary | ICD-10-CM | POA: Diagnosis not present

## 2018-05-20 NOTE — Assessment & Plan Note (Signed)
Right glenohumeral injection, previous injection was back in May.

## 2018-05-20 NOTE — Assessment & Plan Note (Signed)
Left rhomboid trigger point injection. Return as needed.

## 2018-05-20 NOTE — Progress Notes (Signed)
Subjective:    CC: Follow-up  HPI: Bilateral shoulder pain: Alejandro Hicks returns, back in May we did a right glenohumeral and left rhomboid trigger point injection, he has done extremely well.  He is now having a recurrence of pain, moderate, persistent, localized over the right shoulder joint line and medial to the left scapula, moderate, persistent without radiation.  No trauma, no constitutional symptoms, no mechanical symptoms.  I reviewed the past medical history, family history, social history, surgical history, and allergies today and no changes were needed.  Please see the problem list section below in epic for further details.  Past Medical History: Past Medical History:  Diagnosis Date  . Allergic rhinitis   . Anxiety   . Asthma   . BPH (benign prostatic hypertrophy)   . Colonic polyp   . Dyslipidemia   . ED (erectile dysfunction)   . GERD (gastroesophageal reflux disease)   . Inguinal hernia    Left S/P repair in 2012  . Leukoplakia of vocal cords   . OA (osteoarthritis)   . OSA (obstructive sleep apnea)    Does not use CPAP  . Reflux laryngitis    ENT ecal, PPIs twice a day  . Rosacea    Past Surgical History: Past Surgical History:  Procedure Laterality Date  . COLONOSCOPY  2009  . INGUINAL HERNIA REPAIR Left    2012   Social History: Social History   Socioeconomic History  . Marital status: Single    Spouse name: Not on file  . Number of children: Not on file  . Years of education: Not on file  . Highest education level: Not on file  Occupational History  . Not on file  Social Needs  . Financial resource strain: Not on file  . Food insecurity:    Worry: Not on file    Inability: Not on file  . Transportation needs:    Medical: Not on file    Non-medical: Not on file  Tobacco Use  . Smoking status: Former Games developer  . Smokeless tobacco: Never Used  . Tobacco comment: Quit 1970  Substance and Sexual Activity  . Alcohol use: Yes    Comment: social  .  Drug use: No  . Sexual activity: Not on file  Lifestyle  . Physical activity:    Days per week: Not on file    Minutes per session: Not on file  . Stress: Not on file  Relationships  . Social connections:    Talks on phone: Not on file    Gets together: Not on file    Attends religious service: Not on file    Active member of club or organization: Not on file    Attends meetings of clubs or organizations: Not on file    Relationship status: Not on file  Other Topics Concern  . Not on file  Social History Narrative  . Not on file   Family History: No family history on file. Allergies: Allergies  Allergen Reactions  . Lipitor [Atorvastatin]     Myalgia  . Zocor [Simvastatin]     Myalgia   Medications: See med rec.  Review of Systems: No fevers, chills, night sweats, weight loss, chest pain, or shortness of breath.   Objective:    General: Well Developed, well nourished, and in no acute distress.  Neuro: Alert and oriented x3, extra-ocular muscles intact, sensation grossly intact.  HEENT: Normocephalic, atraumatic, pupils equal round reactive to light, neck supple, no masses, no lymphadenopathy, thyroid nonpalpable.  Skin: Warm and dry, no rashes. Cardiac: Regular rate and rhythm, no murmurs rubs or gallops, no lower extremity edema.  Respiratory: Clear to auscultation bilaterally. Not using accessory muscles, speaking in full sentences.  Procedure: Real-time Ultrasound Guided Injection of right glenohumeral joint Device: GE Logiq E  Verbal informed consent obtained.  Time-out conducted.  Noted no overlying erythema, induration, or other signs of local infection.  Skin prepped in a sterile fashion.  Local anesthesia: Topical Ethyl chloride.  With sterile technique and under real time ultrasound guidance: I advanced a 22-gauge spinal needle into the joint and injected 1 cc Kenalog 40, 2 cc lidocaine, 2 cc bupivacaine. Completed without difficulty  Pain immediately  resolved suggesting accurate placement of the medication.  Advised to call if fevers/chills, erythema, induration, drainage, or persistent bleeding.  Images permanently stored and available for review in the ultrasound unit.  Impression: Technically successful ultrasound guided injection.  Procedure:  Injection of left rhomboid major trigger point Consent obtained and verified. Time-out conducted. Noted no overlying erythema, induration, or other signs of local infection. Skin prepped in a sterile fashion. Topical analgesic spray: Ethyl chloride. Completed without difficulty. Meds: 1 cc Kenalog 40, 1 cc lidocaine injected in a fanlike pattern. Pain immediately improved suggesting accurate placement of the medication. Advised to call if fevers/chills, erythema, induration, drainage, or persistent bleeding.  Impression and Recommendations:    Right shoulder pain with history of right proximal biceps rupture Right glenohumeral injection, previous injection was back in May.  Right cervical radiculopathy Left rhomboid trigger point injection. Return as needed. ___________________________________________ Alejandro Hicks. Alejandro Hicks, M.D., ABFM., CAQSM. Primary Care and Sports Medicine [facility]  Adjunct Instructor of Family Medicine  [employer]

## 2018-08-29 ENCOUNTER — Ambulatory Visit (INDEPENDENT_AMBULATORY_CARE_PROVIDER_SITE_OTHER): Payer: Medicare Other | Admitting: Sports Medicine

## 2018-08-29 DIAGNOSIS — M791 Myalgia, unspecified site: Secondary | ICD-10-CM

## 2018-08-29 DIAGNOSIS — S46211D Strain of muscle, fascia and tendon of other parts of biceps, right arm, subsequent encounter: Secondary | ICD-10-CM

## 2018-08-29 DIAGNOSIS — M5136 Other intervertebral disc degeneration, lumbar region: Secondary | ICD-10-CM

## 2018-08-29 DIAGNOSIS — M5412 Radiculopathy, cervical region: Secondary | ICD-10-CM

## 2018-08-29 DIAGNOSIS — M51369 Other intervertebral disc degeneration, lumbar region without mention of lumbar back pain or lower extremity pain: Secondary | ICD-10-CM

## 2018-08-29 NOTE — Assessment & Plan Note (Addendum)
No injection today. 

## 2018-08-29 NOTE — Assessment & Plan Note (Signed)
Lumbar strain while golfing. Heating pad, rehab exercises given, return as needed for this.

## 2018-08-29 NOTE — Assessment & Plan Note (Signed)
Did well with a left rhomboid trigger point injection approximately 3 to 4 months ago, having recurrence of symptoms, adding rehab exercises, I did 3 trigger point injections today.

## 2018-08-29 NOTE — Progress Notes (Addendum)
Subjective:    CC: Posterior shoulder pain  HPI: Alejandro Hicks is a pleasant 85 year old male, he has a history of cervical DDD, almost 4 months ago we did some trigger point injections that worked well.  He is now having a recurrence of pain, localized in the periscapular region, nothing overtly radicular down to the hands or fingertips, no constitutional symptoms, no trauma, no progressive weakness.  In addition while swinging a golf club he felt a sharp pain in the left side of his low back, pain is axial, worse with flexion, sitting, nothing radicular down to the legs, feet.  No bowel or bladder dysfunction, saddle numbness, constitutional symptoms, he did have an MRI of the lumbar spine done recently that was for the most part normal.  I reviewed the past medical history, family history, social history, surgical history, and allergies today and no changes were needed.  Please see the problem list section below in epic for further details.  Past Medical History: Past Medical History:  Diagnosis Date  . Allergic rhinitis   . Anxiety   . Asthma   . BPH (benign prostatic hypertrophy)   . Colonic polyp   . Dyslipidemia   . ED (erectile dysfunction)   . GERD (gastroesophageal reflux disease)   . Inguinal hernia    Left S/P repair in 2012  . Leukoplakia of vocal cords   . OA (osteoarthritis)   . OSA (obstructive sleep apnea)    Does not use CPAP  . Reflux laryngitis    ENT ecal, PPIs twice a day  . Rosacea    Past Surgical History: Past Surgical History:  Procedure Laterality Date  . COLONOSCOPY  2009  . INGUINAL HERNIA REPAIR Left    2012   Social History: Social History   Socioeconomic History  . Marital status: Single    Spouse name: Not on file  . Number of children: Not on file  . Years of education: Not on file  . Highest education level: Not on file  Occupational History  . Not on file  Social Needs  . Financial resource strain: Not on file  . Food insecurity:   Worry: Not on file    Inability: Not on file  . Transportation needs:    Medical: Not on file    Non-medical: Not on file  Tobacco Use  . Smoking status: Former Games developer  . Smokeless tobacco: Never Used  . Tobacco comment: Quit 1970  Substance and Sexual Activity  . Alcohol use: Yes    Comment: social  . Drug use: No  . Sexual activity: Not on file  Lifestyle  . Physical activity:    Days per week: Not on file    Minutes per session: Not on file  . Stress: Not on file  Relationships  . Social connections:    Talks on phone: Not on file    Gets together: Not on file    Attends religious service: Not on file    Active member of club or organization: Not on file    Attends meetings of clubs or organizations: Not on file    Relationship status: Not on file  Other Topics Concern  . Not on file  Social History Narrative  . Not on file   Family History: No family history on file. Allergies: Allergies  Allergen Reactions  . Lipitor [Atorvastatin]     Myalgia  . Zocor [Simvastatin]     Myalgia   Medications: See med rec.  Review of Systems:  No fevers, chills, night sweats, weight loss, chest pain, or shortness of breath.   Objective:    General: Well Developed, well nourished, and in no acute distress.  Neuro: Alert and oriented x3, extra-ocular muscles intact, sensation grossly intact.  HEENT: Normocephalic, atraumatic, pupils equal round reactive to light, neck supple, no masses, no lymphadenopathy, thyroid nonpalpable.  Skin: Warm and dry, no rashes. Cardiac: Regular rate and rhythm, no murmurs rubs or gallops, no lower extremity edema.  Respiratory: Clear to auscultation bilaterally. Not using accessory muscles, speaking in full sentences. Back Exam:  Inspection: Unremarkable  Motion: Flexion 45 deg, Extension 45 deg, Side Bending to 45 deg bilaterally,  Rotation to 45 deg bilaterally  SLR laying: Negative  XSLR laying: Negative  Palpable tenderness: None. FABER:  negative. Sensory change: Gross sensation intact to all lumbar and sacral dermatomes.  Reflexes: 2+ at both patellar tendons, 2+ at achilles tendons, Babinski's downgoing.  Strength at foot  Plantar-flexion: 5/5 Dorsi-flexion: 5/5 Eversion: 5/5 Inversion: 5/5  Leg strength  Quad: 5/5 Hamstring: 5/5 Hip flexor: 5/5 Hip abductors: 5/5  Gait unremarkable.  Procedure:  Injection of #3 trigger points Consent obtained and verified. Time-out conducted. Noted no overlying erythema, induration, or other signs of local infection. Skin prepped in a sterile fashion. Topical analgesic spray: Ethyl chloride. Completed without difficulty. Meds: A total of 1 cc Kenalog 40, 5 cc lidocaine spread out between 3 trigger points, 1 in the left rhomboid, 1 in the right trapezius and 1 in the right rhomboid. Pain immediately improved suggesting accurate placement of the medication. Advised to call if fevers/chills, erythema, induration, drainage, or persistent bleeding.  Impression and Recommendations:    Right shoulder pain with history of right proximal biceps rupture No injection today.   Lumbar degenerative disc disease Lumbar strain while golfing. Heating pad, rehab exercises given, return as needed for this.  Right cervical radiculopathy Did well with a left rhomboid trigger point injection approximately 3 to 4 months ago, having recurrence of symptoms, adding rehab exercises, I did 3 trigger point injections today. ___________________________________________ [name]. Benjamin Stain, M.D., ABFM., CAQSM. Primary Care and Sports Medicine [facility]  Adjunct Professor of Family Medicine  [employer]

## 2018-09-26 ENCOUNTER — Ambulatory Visit: Payer: Medicare Other | Admitting: Sports Medicine

## 2018-10-03 ENCOUNTER — Ambulatory Visit (INDEPENDENT_AMBULATORY_CARE_PROVIDER_SITE_OTHER): Payer: Medicare Other | Admitting: Sports Medicine

## 2018-10-03 ENCOUNTER — Encounter: Payer: Self-pay | Admitting: Sports Medicine

## 2018-10-03 DIAGNOSIS — M5030 Other cervical disc degeneration, unspecified cervical region: Secondary | ICD-10-CM | POA: Diagnosis not present

## 2018-10-03 NOTE — Progress Notes (Signed)
Subjective:    CC: Recheck neck and shoulder pain  HPI: Alejandro Hicks is a pleasant 85 year old male, he has glenohumeral osteoarthritis that is doing well.  We have been doing trigger point injections, he has known cervical DDD on an MRI from 2017.  He had a bit of relief from his trigger point injections but has persistent left and right periscapular symptoms with radiation occasionally down into the left fourth and fifth fingers, mostly at night.  Moderate, persistent, localized with the above radiation.  I reviewed the past medical history, family history, social history, surgical history, and allergies today and no changes were needed.  Please see the problem list section below in epic for further details.  Past Medical History: Past Medical History:  Diagnosis Date  . Allergic rhinitis   . Anxiety   . Asthma   . BPH (benign prostatic hypertrophy)   . Colonic polyp   . Dyslipidemia   . ED (erectile dysfunction)   . GERD (gastroesophageal reflux disease)   . Inguinal hernia    Left S/P repair in 2012  . Leukoplakia of vocal cords   . OA (osteoarthritis)   . OSA (obstructive sleep apnea)    Does not use CPAP  . Reflux laryngitis    ENT ecal, PPIs twice a day  . Rosacea    Past Surgical History: Past Surgical History:  Procedure Laterality Date  . COLONOSCOPY  2009  . INGUINAL HERNIA REPAIR Left    2012   Social History: Social History   Socioeconomic History  . Marital status: Single    Spouse name: Not on file  . Number of children: Not on file  . Years of education: Not on file  . Highest education level: Not on file  Occupational History  . Not on file  Social Needs  . Financial resource strain: Not on file  . Food insecurity:    Worry: Not on file    Inability: Not on file  . Transportation needs:    Medical: Not on file    Non-medical: Not on file  Tobacco Use  . Smoking status: Former Games developermoker  . Smokeless tobacco: Never Used  . Tobacco comment: Quit 1970    Substance and Sexual Activity  . Alcohol use: Yes    Comment: social  . Drug use: No  . Sexual activity: Not on file  Lifestyle  . Physical activity:    Days per week: Not on file    Minutes per session: Not on file  . Stress: Not on file  Relationships  . Social connections:    Talks on phone: Not on file    Gets together: Not on file    Attends religious service: Not on file    Active member of club or organization: Not on file    Attends meetings of clubs or organizations: Not on file    Relationship status: Not on file  Other Topics Concern  . Not on file  Social History Narrative  . Not on file   Family History: No family history on file. Allergies: Allergies  Allergen Reactions  . Lipitor [Atorvastatin]     Myalgia  . Zocor [Simvastatin]     Myalgia   Medications: See med rec.  Review of Systems: No fevers, chills, night sweats, weight loss, chest pain, or shortness of breath.   Objective:    General: Well Developed, well nourished, and in no acute distress.  Neuro: Alert and oriented x3, extra-ocular muscles intact, sensation grossly intact.  HEENT: Normocephalic, atraumatic, pupils equal round reactive to light, neck supple, no masses, no lymphadenopathy, thyroid nonpalpable.  Skin: Warm and dry, no rashes. Cardiac: Regular rate and rhythm, no murmurs rubs or gallops, no lower extremity edema.  Respiratory: Clear to auscultation bilaterally. Not using accessory muscles, speaking in full sentences. Neck: Negative spurling's Full neck range of motion Grip strength and sensation normal in bilateral hands Strength good C4 to T1 distribution No sensory change to C4 to T1 Reflexes normal  Impression and Recommendations:    DDD (degenerative disc disease), cervical Trigger point injections of done okay. He does have cervical DDD. Because symptoms are predominantly at night and clinically are in the form of periscapular trigger points we are going to add  gabapentin in an up taper at bedtime. If insufficient relief with a steady up taper of gabapentin I will proceed with a cervical epidural. ___________________________________________ [name]. Benjamin Stain, M.D., ABFM., CAQSM. Primary Care and Sports Medicine [facility]  Adjunct Professor of Family Medicine  [employer]

## 2018-10-03 NOTE — Assessment & Plan Note (Signed)
Trigger point injections of done okay. He does have cervical DDD. Because symptoms are predominantly at night and clinically are in the form of periscapular trigger points we are going to add gabapentin in an up taper at bedtime. If insufficient relief with a steady up taper of gabapentin I will proceed with a cervical epidural.

## 2018-10-31 ENCOUNTER — Encounter: Payer: Self-pay | Admitting: Sports Medicine

## 2018-10-31 ENCOUNTER — Ambulatory Visit (INDEPENDENT_AMBULATORY_CARE_PROVIDER_SITE_OTHER): Payer: Medicare Other

## 2018-10-31 ENCOUNTER — Ambulatory Visit: Payer: Medicare Other | Admitting: Sports Medicine

## 2018-10-31 DIAGNOSIS — M5136 Other intervertebral disc degeneration, lumbar region: Secondary | ICD-10-CM | POA: Diagnosis not present

## 2018-10-31 DIAGNOSIS — M50321 Other cervical disc degeneration at C4-C5 level: Secondary | ICD-10-CM | POA: Diagnosis not present

## 2018-10-31 DIAGNOSIS — M47816 Spondylosis without myelopathy or radiculopathy, lumbar region: Secondary | ICD-10-CM | POA: Diagnosis not present

## 2018-10-31 DIAGNOSIS — M5030 Other cervical disc degeneration, unspecified cervical region: Secondary | ICD-10-CM

## 2018-10-31 DIAGNOSIS — M4807 Spinal stenosis, lumbosacral region: Secondary | ICD-10-CM

## 2018-10-31 DIAGNOSIS — M4802 Spinal stenosis, cervical region: Secondary | ICD-10-CM

## 2018-10-31 DIAGNOSIS — M51369 Other intervertebral disc degeneration, lumbar region without mention of lumbar back pain or lower extremity pain: Secondary | ICD-10-CM

## 2018-10-31 NOTE — Progress Notes (Signed)
Subjective:    CC: Neck pain, foot numbness  HPI: Alejandro Hicks returns, he is a pleasant 85 year old male.  He has known cervical degenerative disc disease, we have been doing trigger point injections, we have been gabapentin, therapy, nothing is helped.  Continues to have periscapular radicular symptoms.  He understands that and epidurals coming next but he would like to wait until his golf tournament that is closer.  He also is only doing her gabapentin 300 mg at bedtime.  Foot numbness: Gets paresthesias in the bottom of his feet at bedtime.  He has some back pain, as well as some paresthesias in his groin and perineum as well.  No progressive weakness, no constitutional symptoms, no trauma.  I reviewed the past medical history, family history, social history, surgical history, and allergies today and no changes were needed.  Please see the problem list section below in epic for further details.  Past Medical History: Past Medical History:  Diagnosis Date  . Allergic rhinitis   . Anxiety   . Asthma   . BPH (benign prostatic hypertrophy)   . Colonic polyp   . Dyslipidemia   . ED (erectile dysfunction)   . GERD (gastroesophageal reflux disease)   . Inguinal hernia    Left S/P repair in 2012  . Leukoplakia of vocal cords   . OA (osteoarthritis)   . OSA (obstructive sleep apnea)    Does not use CPAP  . Reflux laryngitis    ENT ecal, PPIs twice a day  . Rosacea    Past Surgical History: Past Surgical History:  Procedure Laterality Date  . COLONOSCOPY  2009  . INGUINAL HERNIA REPAIR Left    2012   Social History: Social History   Socioeconomic History  . Marital status: Single    Spouse name: Not on file  . Number of children: Not on file  . Years of education: Not on file  . Highest education level: Not on file  Occupational History  . Not on file  Social Needs  . Financial resource strain: Not on file  . Food insecurity:    Worry: Not on file    Inability: Not on file    . Transportation needs:    Medical: Not on file    Non-medical: Not on file  Tobacco Use  . Smoking status: Former Games developer  . Smokeless tobacco: Never Used  . Tobacco comment: Quit 1970  Substance and Sexual Activity  . Alcohol use: Yes    Comment: social  . Drug use: No  . Sexual activity: Not on file  Lifestyle  . Physical activity:    Days per week: Not on file    Minutes per session: Not on file  . Stress: Not on file  Relationships  . Social connections:    Talks on phone: Not on file    Gets together: Not on file    Attends religious service: Not on file    Active member of club or organization: Not on file    Attends meetings of clubs or organizations: Not on file    Relationship status: Not on file  Other Topics Concern  . Not on file  Social History Narrative  . Not on file   Family History: No family history on file. Allergies: Allergies  Allergen Reactions  . Lipitor [Atorvastatin]     Myalgia  . Zocor [Simvastatin]     Myalgia   Medications: See med rec.  Review of Systems: No fevers, chills, night sweats,  weight loss, chest pain, or shortness of breath.   Objective:    General: Well Developed, well nourished, and in no acute distress.  Neuro: Alert and oriented x3, extra-ocular muscles intact, sensation grossly intact.  HEENT: Normocephalic, atraumatic, pupils equal round reactive to light, neck supple, no masses, no lymphadenopathy, thyroid nonpalpable.  Skin: Warm and dry, no rashes. Cardiac: Regular rate and rhythm, no murmurs rubs or gallops, no lower extremity edema.  Respiratory: Clear to auscultation bilaterally. Not using accessory muscles, speaking in full sentences.  Impression and Recommendations:    DDD (degenerative disc disease), cervical Persistent discomfort, periscapular in spite of low-dose gabapentin, trigger point injections. I am going to get an updated cervical MRI. This is in anticipation of a cervical epidural. I also  advised him that he really does need to go up on his gabapentin dose. He is only using 1 pill at bedtime. He does desire to hold off on any epidurals until early April before his golf tournament.  Lumbar degenerative disc disease Bilateral radicular symptoms, bottom of foot in an L5 distribution mostly at night. Also having some paresthesias in the scrotum. Updating lumbar spine MRI. He also needs to increase his gabapentin.    ___________________________________________ [name]. Benjamin Stain, M.D., ABFM., CAQSM. Primary Care and Sports Medicine [facility]  Adjunct Professor of Family Medicine  [employer]

## 2018-10-31 NOTE — Assessment & Plan Note (Signed)
Bilateral radicular symptoms, bottom of foot in an L5 distribution mostly at night. Also having some paresthesias in the scrotum. Updating lumbar spine MRI. He also needs to increase his gabapentin.

## 2018-10-31 NOTE — Assessment & Plan Note (Addendum)
Persistent discomfort, periscapular in spite of low-dose gabapentin, trigger point injections. I am going to get an updated cervical MRI. This is in anticipation of a cervical epidural. I also advised him that he really does need to go up on his gabapentin dose. He is only using 1 pill at bedtime. He does desire to hold off on any epidurals until early April before his golf tournament.

## 2018-12-13 ENCOUNTER — Ambulatory Visit (INDEPENDENT_AMBULATORY_CARE_PROVIDER_SITE_OTHER): Payer: Medicare Other

## 2018-12-13 ENCOUNTER — Encounter: Payer: Self-pay | Admitting: Sports Medicine

## 2018-12-13 ENCOUNTER — Other Ambulatory Visit: Payer: Self-pay

## 2018-12-13 ENCOUNTER — Ambulatory Visit (INDEPENDENT_AMBULATORY_CARE_PROVIDER_SITE_OTHER): Payer: Medicare Other | Admitting: Sports Medicine

## 2018-12-13 DIAGNOSIS — M1611 Unilateral primary osteoarthritis, right hip: Secondary | ICD-10-CM | POA: Diagnosis not present

## 2018-12-13 DIAGNOSIS — S46211D Strain of muscle, fascia and tendon of other parts of biceps, right arm, subsequent encounter: Secondary | ICD-10-CM | POA: Diagnosis not present

## 2018-12-13 MED ORDER — CELECOXIB 200 MG PO CAPS
ORAL_CAPSULE | ORAL | 2 refills | Status: DC
Start: 2018-12-13 — End: 2019-06-06

## 2018-12-13 NOTE — Assessment & Plan Note (Signed)
Repeat right glenohumeral injection, previous injection was back in September 2019.

## 2018-12-13 NOTE — Assessment & Plan Note (Signed)
We will start conservative, x-rays, switch from ibuprofen to Celebrex, hip arthritis rehabilitation exercises given, return to see me in 6 weeks, injection if no better.

## 2018-12-13 NOTE — Progress Notes (Signed)
Subjective:    CC: Follow-up  HPI: Right shoulder pain: Known osteoarthritis, pain is moderate, persistent, localized without radiation, last injection was in September, he does desire repeat interventional treatment today.  Right hip pain: Localized in the buttock, radiating around to the groin, worse with prolonged weightbearing, moderate gelling, no mechanical symptoms, no trauma, worse with swinging a golf club.  I reviewed the past medical history, family history, social history, surgical history, and allergies today and no changes were needed.  Please see the problem list section below in epic for further details.  Past Medical History: Past Medical History:  Diagnosis Date  . Allergic rhinitis   . Anxiety   . Asthma   . BPH (benign prostatic hypertrophy)   . Colonic polyp   . Dyslipidemia   . ED (erectile dysfunction)   . GERD (gastroesophageal reflux disease)   . Inguinal hernia    Left S/P repair in 2012  . Leukoplakia of vocal cords   . OA (osteoarthritis)   . OSA (obstructive sleep apnea)    Does not use CPAP  . Reflux laryngitis    ENT ecal, PPIs twice a day  . Rosacea    Past Surgical History: Past Surgical History:  Procedure Laterality Date  . COLONOSCOPY  2009  . INGUINAL HERNIA REPAIR Left    2012   Social History: Social History   Socioeconomic History  . Marital status: Single    Spouse name: Not on file  . Number of children: Not on file  . Years of education: Not on file  . Highest education level: Not on file  Occupational History  . Not on file  Social Needs  . Financial resource strain: Not on file  . Food insecurity:    Worry: Not on file    Inability: Not on file  . Transportation needs:    Medical: Not on file    Non-medical: Not on file  Tobacco Use  . Smoking status: Former Games developer  . Smokeless tobacco: Never Used  . Tobacco comment: Quit 1970  Substance and Sexual Activity  . Alcohol use: Yes    Comment: social  . Drug  use: No  . Sexual activity: Not on file  Lifestyle  . Physical activity:    Days per week: Not on file    Minutes per session: Not on file  . Stress: Not on file  Relationships  . Social connections:    Talks on phone: Not on file    Gets together: Not on file    Attends religious service: Not on file    Active member of club or organization: Not on file    Attends meetings of clubs or organizations: Not on file    Relationship status: Not on file  Other Topics Concern  . Not on file  Social History Narrative  . Not on file   Family History: No family history on file. Allergies: Allergies  Allergen Reactions  . Lipitor [Atorvastatin]     Myalgia  . Zocor [Simvastatin]     Myalgia   Medications: See med rec.  Review of Systems: No fevers, chills, night sweats, weight loss, chest pain, or shortness of breath.   Objective:    General: Well Developed, well nourished, and in no acute distress.  Neuro: Alert and oriented x3, extra-ocular muscles intact, sensation grossly intact.  HEENT: Normocephalic, atraumatic, pupils equal round reactive to light, neck supple, no masses, no lymphadenopathy, thyroid nonpalpable.  Skin: Warm and dry, no rashes.  Cardiac: Regular rate and rhythm, no murmurs rubs or gallops, no lower extremity edema.  Respiratory: Clear to auscultation bilaterally. Not using accessory muscles, speaking in full sentences. Right hip: ROM IR: 45 degrees with reproduction of pain, ER: 60 Deg, Flexion: 120 Deg, Extension: 100 Deg, Abduction: 45 Deg, Adduction: 45 Deg Strength IR: 5/5, ER: 5/5, Flexion: 5/5, Extension: 5/5, Abduction: 5/5, Adduction: 5/5 No reproduction of pain with resisted abduction Pelvic alignment unremarkable to inspection and palpation. Standing hip rotation and gait without trendelenburg / unsteadiness. Greater trochanter without tenderness to palpation. No tenderness over piriformis. No SI joint tenderness and normal minimal SI movement.   Procedure: Real-time Ultrasound Guided injection of the right glenohumeral joint Device: GE Logiq E  Verbal informed consent obtained.  Time-out conducted.  Noted no overlying erythema, induration, or other signs of local infection.  Skin prepped in a sterile fashion.  Local anesthesia: Topical Ethyl chloride.  With sterile technique and under real time ultrasound guidance:  22-gauge spinal needle advanced into the glenohumeral joint from a posterior approach, I then injected 1 cc Kenalog 40, 2 cc lidocaine, 2 cc bupivacaine. Completed without difficulty  Pain immediately resolved suggesting accurate placement of the medication.  Advised to call if fevers/chills, erythema, induration, drainage, or persistent bleeding.  Images permanently stored and available for review in the ultrasound unit.  Impression: Technically successful ultrasound guided injection.  Impression and Recommendations:    Primary osteoarthritis of right hip We will start conservative, x-rays, switch from ibuprofen to Celebrex, hip arthritis rehabilitation exercises given, return to see me in 6 weeks, injection if no better.  Right shoulder pain with history of right proximal biceps rupture Repeat right glenohumeral injection, previous injection was back in September 2019.   ___________________________________________ [name], M.D., ABFM., CAQSM. Primary Care and Sports Medicine [facility]  Adjunct Professor of Family Medicine  [employer]

## 2019-01-24 ENCOUNTER — Encounter: Payer: Self-pay | Admitting: Sports Medicine

## 2019-01-24 ENCOUNTER — Ambulatory Visit (INDEPENDENT_AMBULATORY_CARE_PROVIDER_SITE_OTHER): Payer: Medicare Other | Admitting: Sports Medicine

## 2019-01-24 DIAGNOSIS — M1611 Unilateral primary osteoarthritis, right hip: Secondary | ICD-10-CM | POA: Diagnosis not present

## 2019-01-24 DIAGNOSIS — M5030 Other cervical disc degeneration, unspecified cervical region: Secondary | ICD-10-CM

## 2019-01-24 NOTE — Progress Notes (Signed)
Subjective:    CC: Follow-up  HPI: Right hip osteoarthritis: Resolved with Celebrex.  Right glenohumeral osteoarthritis, continues to do well after an injection almost 2 months ago.  Periscapular pain: Previously we have diagnosed this as cervical radicular, continues to have discomfort but he tells me is not bad enough to consider an epidural.  I reviewed the past medical history, family history, social history, surgical history, and allergies today and no changes were needed.  Please see the problem list section below in epic for further details.  Past Medical History: Past Medical History:  Diagnosis Date  . Allergic rhinitis   . Anxiety   . Asthma   . BPH (benign prostatic hypertrophy)   . Colonic polyp   . Dyslipidemia   . ED (erectile dysfunction)   . GERD (gastroesophageal reflux disease)   . Inguinal hernia    Left S/P repair in 2012  . Leukoplakia of vocal cords   . OA (osteoarthritis)   . OSA (obstructive sleep apnea)    Does not use CPAP  . Reflux laryngitis    ENT ecal, PPIs twice a day  . Rosacea    Past Surgical History: Past Surgical History:  Procedure Laterality Date  . COLONOSCOPY  2009  . INGUINAL HERNIA REPAIR Left    2012   Social History: Social History   Socioeconomic History  . Marital status: Single    Spouse name: Not on file  . Number of children: Not on file  . Years of education: Not on file  . Highest education level: Not on file  Occupational History  . Not on file  Social Needs  . Financial resource strain: Not on file  . Food insecurity:    Worry: Not on file    Inability: Not on file  . Transportation needs:    Medical: Not on file    Non-medical: Not on file  Tobacco Use  . Smoking status: Former Games developer  . Smokeless tobacco: Never Used  . Tobacco comment: Quit 1970  Substance and Sexual Activity  . Alcohol use: Yes    Comment: social  . Drug use: No  . Sexual activity: Not on file  Lifestyle  . Physical  activity:    Days per week: Not on file    Minutes per session: Not on file  . Stress: Not on file  Relationships  . Social connections:    Talks on phone: Not on file    Gets together: Not on file    Attends religious service: Not on file    Active member of club or organization: Not on file    Attends meetings of clubs or organizations: Not on file    Relationship status: Not on file  Other Topics Concern  . Not on file  Social History Narrative  . Not on file   Family History: No family history on file. Allergies: Allergies  Allergen Reactions  . Lipitor [Atorvastatin]     Myalgia  . Zocor [Simvastatin]     Myalgia   Medications: See med rec.  Review of Systems: No fevers, chills, night sweats, weight loss, chest pain, or shortness of breath.   Objective:    General: Well Developed, well nourished, and in no acute distress.  Neuro: Alert and oriented x3, extra-ocular muscles intact, sensation grossly intact.  HEENT: Normocephalic, atraumatic, pupils equal round reactive to light, neck supple, no masses, no lymphadenopathy, thyroid nonpalpable.  Skin: Warm and dry, no rashes. Cardiac: Regular rate and rhythm, no  murmurs rubs or gallops, no lower extremity edema.  Respiratory: Clear to auscultation bilaterally. Not using accessory muscles, speaking in full sentences.  Impression and Recommendations:    Primary osteoarthritis of right hip Resolved with Celebrex. He is getting a bit of dyspepsia so he is going to drop to every other day dosing.  DDD (degenerative disc disease), cervical Right periscapular discomfort in spite of low-dose gabapentin, trigger point injections. He will call me if he desires cervical epidural which would be the neck step.   ___________________________________________ [name], M.D., ABFM., CAQSM. Primary Care and Sports Medicine [facility]  Adjunct Professor of Family Medicine  [employer]

## 2019-01-24 NOTE — Assessment & Plan Note (Signed)
Resolved with Celebrex. He is getting a bit of dyspepsia so he is going to drop to every other day dosing.

## 2019-01-24 NOTE — Assessment & Plan Note (Signed)
Right periscapular discomfort in spite of low-dose gabapentin, trigger point injections. He will call me if he desires cervical epidural which would be the neck step.

## 2019-02-06 ENCOUNTER — Other Ambulatory Visit: Payer: Self-pay | Admitting: Sports Medicine

## 2019-02-06 DIAGNOSIS — M5136 Other intervertebral disc degeneration, lumbar region: Secondary | ICD-10-CM

## 2019-02-06 DIAGNOSIS — M51369 Other intervertebral disc degeneration, lumbar region without mention of lumbar back pain or lower extremity pain: Secondary | ICD-10-CM

## 2019-02-14 ENCOUNTER — Ambulatory Visit: Payer: Medicare Other | Admitting: Physical Therapy

## 2019-02-14 ENCOUNTER — Other Ambulatory Visit: Payer: Self-pay

## 2019-02-14 ENCOUNTER — Encounter: Payer: Self-pay | Admitting: Physical Therapy

## 2019-02-14 DIAGNOSIS — R252 Cramp and spasm: Secondary | ICD-10-CM | POA: Diagnosis not present

## 2019-02-14 DIAGNOSIS — M545 Low back pain: Secondary | ICD-10-CM | POA: Diagnosis not present

## 2019-02-14 DIAGNOSIS — G8929 Other chronic pain: Secondary | ICD-10-CM

## 2019-02-14 DIAGNOSIS — M546 Pain in thoracic spine: Secondary | ICD-10-CM

## 2019-02-14 NOTE — Patient Instructions (Signed)
Access Code: [email or web address]  URL: [email or web address]  Date: 02/14/2019  Prepared by: Faustino Congress   Exercises  Seated Thoracic Flexion and Rotation with Swiss Ball - 3 reps - 1 sets - 30 sec hold - 2x daily - 7x weekly  Standing Quadratus Lumborum Mobilization with Small Ball on Wall - 5 reps - 1 sets - 30-60 sec hold - 1x daily - 7x weekly  Patient Education  Trigger Point Dry Needling

## 2019-02-14 NOTE — Therapy (Signed)
[facility] [place], [place], [address] Phone: [phone]   Fax:  [phone]  Physical Therapy Evaluation  Patient Details  Name: Alejandro Hicks MRN: [id number] Date of Birth: Jul 02, 1934 Referring Provider (PT): Silverio Decamp, MD   Encounter Date: 02/14/2019  PT End of Session - 02/14/19 1233    Visit Number  1    Number of Visits  6    Date for PT Re-Evaluation  03/28/19    PT Start Time  1130    PT Stop Time  1210    PT Time Calculation (min)  40 min    Activity Tolerance  Patient tolerated treatment well    Behavior During Therapy  [facility] for tasks assessed/performed       Past Medical History:  Diagnosis Date  . Allergic rhinitis   . Anxiety   . Asthma   . BPH (benign prostatic hypertrophy)   . Colonic polyp   . Dyslipidemia   . ED (erectile dysfunction)   . GERD (gastroesophageal reflux disease)   . Inguinal hernia    Left S/P repair in 2012  . Leukoplakia of vocal cords   . OA (osteoarthritis)   . OSA (obstructive sleep apnea)    Does not use CPAP  . Reflux laryngitis    ENT ecal, PPIs twice a day  . Rosacea     Past Surgical History:  Procedure Laterality Date  . COLONOSCOPY  2009  . INGUINAL HERNIA REPAIR Left    2012    There were no vitals filed for this visit.   Subjective Assessment - 02/14/19 1132    Subjective  Pt is an 85 y/o male who presents to OPPT for Rt sided mid to LBP.  Pt reports dx of DDD and feels he has increased muscle spasms, which he notices when playing golf.    Patient Stated Goals  playing golf    Currently in Pain?  Yes    Pain Score  0-No pain   up to 4-5/10 (pt with difficulty putting number to pain)   Pain Location  Back    Pain Orientation  Right;Mid;Lower    Pain Descriptors / Indicators  Tightness;Spasm    Pain Type  Acute pain;Chronic pain    Pain Onset  More than a month ago    Pain Frequency  Intermittent    Aggravating Factors    twisting motion    Pain Relieving Factors  CBD oil, massage         [facility] PT Assessment - 02/14/19 1138      Assessment   Medical Diagnosis  M51.36 (ICD-10-CM) - Lumbar degenerative disc disease    Referring Provider (PT)  Silverio Decamp, MD    Onset Date/Surgical Date  --   chronic   Hand Dominance  Right    Next MD Visit  PRN    Prior Therapy  previously at this facility      Precautions   Precautions  None      Restrictions   Weight Bearing Restrictions  No      Balance Screen   Has the patient fallen in the past 6 months  No    Has the patient had a decrease in activity level because of a fear of falling?   No    Is the patient reluctant to leave their home because of a fear of falling?   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    Leisure  golfing      Cognition   Overall Cognitive Status  Within Functional Limits for tasks assessed      Observation/Other Assessments   Focus on Therapeutic Outcomes (FOTO)   63 (37% limited; predicted 32% limited)      Posture/Postural Control   Posture/Postural Control  Postural limitations    Postural Limitations  Decreased lumbar lordosis;Rounded Shoulders;Forward head      ROM / Strength   AROM / PROM / Strength  AROM;Strength      AROM   Overall AROM Comments  thoracolumbar ROM WNL with pain noted on Rt side: Lt sidebending and Rt quadrant      Strength   Overall Strength Comments  grossly WFLs      Palpation   Palpation comment  trigger points noted Rt lats and lumbar paraspinals                Objective measurements completed on examination: See above findings.      [facility] Adult PT  Treatment/Exercise - 02/14/19 1138      Exercises   Exercises  Lumbar      Lumbar Exercises: Stretches   Quadruped Mid Back Stretch  2 reps;30 seconds    Quadruped Mid Back Stretch Limitations  seated with stool to Lt for Rt side stretch    Other Lumbar Stretch Exercise  instructed in use of tennis ball for self mobilization      Manual Therapy   Manual Therapy  Soft tissue mobilization    Manual therapy comments  skilled palpation and monitoring of soft tissue during DN    Soft tissue mobilization  Rt lats and paraspinals       Trigger Point Dry Needling - 02/14/19 1231    Consent Given?  Yes    Education Handout Provided  Yes    Muscles Treated Upper Quadrant  Latissimus dorsi   lower lats   Latissimus dorsi Response  Twitch response elicited;Palpable increased muscle length           PT Education - 02/14/19 1233    Education Details  HEP, DN    Person(s) Educated  Patient    Methods  Explanation;Demonstration;Handout    Comprehension  Verbalized understanding;Returned demonstration;Need further instruction          PT Long Term Goals - 02/14/19 1239      PT LONG TERM GOAL #1   Title  independent with HEP    Status  New    Target Date  03/28/19      PT LONG TERM GOAL #2   Title  perform thoracolumbar ROM without reports of pain    Status  New    Target Date  03/28/19      PT LONG TERM GOAL #3   Title  play golf without pain    Status  New    Target Date  03/28/19      PT LONG TERM GOAL #4   Title  improve FOTO =/< 32% limited    Status  New    Target Date  03/28/19  PT LONG TERM GOAL #5   Title  n/a             Plan - 02/14/19 1233    Clinical Impression Statement  Pt is an 85 y/o male who presents to OPPT for Rt sided mid to low back pain which he notices when performing twisting motions, specifically when golfing.  Pt with trigger points and treated with manual therapy and DN today which he reports resolved symptoms following session.   Will benefit from PT to address deficits listed.    Personal Factors and Comorbidities  Age;Comorbidity 1    Comorbidities  arthritis    Examination-Activity Limitations  Bend;Other   twisting   Examination-Participation Restrictions  Other   golfing, recreation   Stability/Clinical Decision Making  Stable/Uncomplicated    Clinical Decision Making  Low    Rehab Potential  Good    PT Frequency  1x / week    PT Duration  6 weeks    PT Treatment/Interventions  ADLs/Self Care Home Management;Cryotherapy;Traction;Moist Heat;Electrical Stimulation;Functional mobility training;Therapeutic exercise;Therapeutic activities;Patient/family education;Manual techniques;Dry needling;Taping    PT Next Visit Plan  assess response to DN, continue as indicated    PT Home Exercise Plan  Access Code: [email or web address]    Consulted and Agree with Plan of Care  Patient       Patient will benefit from skilled therapeutic intervention in order to improve the following deficits and impairments:  Pain, Postural dysfunction, Increased muscle spasms, Increased fascial restricitons, Impaired flexibility  Visit Diagnosis: 1. Pain in thoracic spine   2. Chronic right-sided low back pain without sciatica   3. Cramp and spasm        Problem List Patient Active Problem List   Diagnosis Date Noted  . Primary osteoarthritis of right hip 12/13/2018  . Plantar fasciitis, right 02/16/2018  . Primary osteoarthritis of right ankle 01/19/2018  . Shoulder impingement syndrome, left 10/01/2017  . Rib pain on right side 12/09/2015  . Right shoulder pain with history of right proximal biceps rupture 12/09/2015  . Essential hypertension, benign 10/21/2015  . Lumbar degenerative disc disease 09/02/2015  . DDD (degenerative disc disease), cervical 08/05/2015      Clarita CraneStephanie F Rohith Fauth, PT, DPT 02/14/19 12:43 PM     [facility] [address] [place], [place],  [address] Phone: [phone]   Fax:  [phone]  Name: Judithann SheenBobby W Hallberg MRN: [id number] Date of Birth: August 15, 1934

## 2019-02-22 ENCOUNTER — Other Ambulatory Visit: Payer: Self-pay

## 2019-02-22 ENCOUNTER — Ambulatory Visit: Payer: Medicare Other | Admitting: Physical Therapy

## 2019-02-22 ENCOUNTER — Encounter: Payer: Self-pay | Admitting: Physical Therapy

## 2019-02-22 DIAGNOSIS — R252 Cramp and spasm: Secondary | ICD-10-CM

## 2019-02-22 DIAGNOSIS — M545 Low back pain: Secondary | ICD-10-CM | POA: Diagnosis not present

## 2019-02-22 DIAGNOSIS — G8929 Other chronic pain: Secondary | ICD-10-CM | POA: Diagnosis not present

## 2019-02-22 DIAGNOSIS — M546 Pain in thoracic spine: Secondary | ICD-10-CM | POA: Diagnosis not present

## 2019-02-22 NOTE — Therapy (Signed)
[facility] [place], [place], [address] Phone: [phone]   Fax:  [phone]  Physical Therapy Treatment  Patient Details  Name: Alejandro Hicks MRN: [id number] Date of Birth: [birth year] Referring Provider (PT): Silverio Decamp, MD   Encounter Date: 02/22/2019  PT End of Session - 02/22/19 1021    Visit Number  2    Number of Visits  6    Date for PT Re-Evaluation  03/28/19    PT Start Time  0930    PT Stop Time  1015    PT Time Calculation (min)  45 min    Activity Tolerance  Patient tolerated treatment well    Behavior During Therapy  [facility] for tasks assessed/performed       Past Medical History:  Diagnosis Date  . Allergic rhinitis   . Anxiety   . Asthma   . BPH (benign prostatic hypertrophy)   . Colonic polyp   . Dyslipidemia   . ED (erectile dysfunction)   . GERD (gastroesophageal reflux disease)   . Inguinal hernia    Left S/P repair in 2012  . Leukoplakia of vocal cords   . OA (osteoarthritis)   . OSA (obstructive sleep apnea)    Does not use CPAP  . Reflux laryngitis    ENT ecal, PPIs twice a day  . Rosacea     Past Surgical History:  Procedure Laterality Date  . COLONOSCOPY  2009  . INGUINAL HERNIA REPAIR Left    2012    There were no vitals filed for this visit.  Subjective Assessment - 02/22/19 0930    Subjective  "I played 27 holes of golf yesterday." thinks he's feeling a little better.    Patient Stated Goals  playing golf    Pain Score  0-No pain   up to 4/10   Pain Location  Back    Pain Orientation  Right;Mid;Lower    Pain Onset  More than a month ago    Aggravating Factors   twisting motion    Pain Relieving Factors  CBD oil, massage                       [facility] Adult PT Treatment/Exercise - 02/22/19 0933      Lumbar Exercises: Stretches   Press Ups  5 reps;15 reps    Quadruped Mid Back Stretch  3 reps;30 seconds    Quadruped Mid Back  Stretch Limitations  seated with green physioball to Lt for Rt side stretch      Lumbar Exercises: Aerobic   Nustep  L4 x 5 min      Lumbar Exercises: Standing   Row  Both;15 reps;Theraband    Theraband Level (Row)  Level 3 (Green)    Row Limitations  5 sec hold; mod cues for technique      Manual Therapy   Manual Therapy  Soft tissue mobilization    Manual therapy comments  skilled palpation and monitoring of soft tissue during DN    Soft tissue mobilization  Rt lats, paraspinals, rhomboids and glute med       Trigger Point Dry Needling - 02/22/19 1019    Consent Given?  Yes    Education Handout Provided  Previously provided    Muscles Treated Upper Quadrant  Rhomboids;Latissimus dorsi   lower lats   Muscles Treated Back/Hip  Lumbar multifidi;Gluteus medius   thoracic multifide   Rhomboids Response  Palpable increased  muscle length;Twitch response elicited    Latissimus dorsi Response  Twitch response elicited;Palpable increased muscle length    Gluteus Medius Response  Twitch response elicited;Palpable increased muscle length    Lumbar multifidi Response  Twitch response elicited;Palpable increased muscle length                PT Long Term Goals - 02/14/19 1239      PT LONG TERM GOAL #1   Title  independent with HEP    Status  New    Target Date  03/28/19      PT LONG TERM GOAL #2   Title  perform thoracolumbar ROM without reports of pain    Status  New    Target Date  03/28/19      PT LONG TERM GOAL #3   Title  play golf without pain    Status  New    Target Date  03/28/19      PT LONG TERM GOAL #4   Title  improve FOTO =/< 32% limited    Status  New    Target Date  03/28/19      PT LONG TERM GOAL #5   Title  n/a            Plan - 02/22/19 1021    Clinical Impression Statement  Pt reports decreased pain but still present.  Has increased amount of golf playing with decreased pain.  Progressing well with PT at this time.  Will continue to  benefit from PT to maximize function.    Personal Factors and Comorbidities  Age;Comorbidity 1    Comorbidities  arthritis    Examination-Activity Limitations  Bend;Other   twisting   Examination-Participation Restrictions  Other   golfing, recreation   Stability/Clinical Decision Making  Stable/Uncomplicated    Rehab Potential  Good    PT Frequency  1x / week    PT Duration  6 weeks    PT Treatment/Interventions  ADLs/Self Care Home Management;Cryotherapy;Traction;Moist Heat;Electrical Stimulation;Functional mobility training;Therapeutic exercise;Therapeutic activities;Patient/family education;Manual techniques;Dry needling;Taping    PT Next Visit Plan  assess response to DN, continue as indicated    PT Home Exercise Plan  Access Code: [email or web address]    Consulted and Agree with Plan of Care  Patient       Patient will benefit from skilled therapeutic intervention in order to improve the following deficits and impairments:  Pain, Postural dysfunction, Increased muscle spasms, Increased fascial restricitons, Impaired flexibility  Visit Diagnosis: 1. Pain in thoracic spine   2. Chronic right-sided low back pain without sciatica   3. Cramp and spasm        Problem List Patient Active Problem List   Diagnosis Date Noted  . Primary osteoarthritis of right hip 12/13/2018  . Plantar fasciitis, right 02/16/2018  . Primary osteoarthritis of right ankle 01/19/2018  . Shoulder impingement syndrome, left 10/01/2017  . Rib pain on right side 12/09/2015  . Right shoulder pain with history of right proximal biceps rupture 12/09/2015  . Essential hypertension, benign 10/21/2015  . Lumbar degenerative disc disease 09/02/2015  . DDD (degenerative disc disease), cervical 08/05/2015      Clarita CraneStephanie F Mysha Peeler, PT, DPT 02/22/19 10:23 AM    [facility] [address] [place], [place], [address] Phone: [phone]   Fax:   [phone]  Name: Alejandro Hicks MRN: [id number] Date of Birth: [birth year]

## 2019-02-22 NOTE — Patient Instructions (Signed)
Access Code: [email or web address]  URL: [email or web address]  Date: 02/22/2019  Prepared by: Faustino Congress   Exercises  Seated Thoracic Flexion and Rotation with Swiss Ball - 3 reps - 1 sets - 30 sec hold - 2x daily - 7x weekly  Standing Quadratus Lumborum Mobilization with Small Ball on Wall - 5 reps - 1 sets - 30-60 sec hold - 1x daily - 7x weekly  Standing Row with Anchored Resistance - 10 reps - 1 sets - 5 sec hold - 1x daily - 7x weekly  Prone Press Up - 5 reps - 1 sets - 10-15 sec hold - 1x daily - 7x weekly  Patient Education  Trigger Point Dry Needling

## 2019-03-01 ENCOUNTER — Other Ambulatory Visit: Payer: Self-pay

## 2019-03-01 ENCOUNTER — Encounter: Payer: Self-pay | Admitting: Physical Therapy

## 2019-03-01 ENCOUNTER — Ambulatory Visit (INDEPENDENT_AMBULATORY_CARE_PROVIDER_SITE_OTHER): Payer: Medicare Other | Admitting: Physical Therapy

## 2019-03-01 DIAGNOSIS — M546 Pain in thoracic spine: Secondary | ICD-10-CM | POA: Diagnosis not present

## 2019-03-01 DIAGNOSIS — M545 Low back pain: Secondary | ICD-10-CM

## 2019-03-01 DIAGNOSIS — R252 Cramp and spasm: Secondary | ICD-10-CM

## 2019-03-01 DIAGNOSIS — G8929 Other chronic pain: Secondary | ICD-10-CM

## 2019-03-01 NOTE — Therapy (Signed)
[facility] [address] [place], [place], [address] Phone: [phone]   Fax:  [phone]  Physical Therapy Treatment  Patient Details  Name: Alejandro SheenBobby W Hicks MRN: [id number] Date of Birth: 1934-04-12 Referring Provider (PT): Monica Bectonhekkekandam, Thomas J, MD   Encounter Date: 03/01/2019  PT End of Session - 03/01/19 1321    Visit Number  3    Number of Visits  6    Date for PT Re-Evaluation  03/28/19    PT Start Time  1225    PT Stop Time  1305    PT Time Calculation (min)  40 min    Activity Tolerance  Patient tolerated treatment well    Behavior During Therapy  [facility] for tasks assessed/performed       Past Medical History:  Diagnosis Date  . Allergic rhinitis   . Anxiety   . Asthma   . BPH (benign prostatic hypertrophy)   . Colonic polyp   . Dyslipidemia   . ED (erectile dysfunction)   . GERD (gastroesophageal reflux disease)   . Inguinal hernia    Left S/P repair in 2012  . Leukoplakia of vocal cords   . OA (osteoarthritis)   . OSA (obstructive sleep apnea)    Does not use CPAP  . Reflux laryngitis    ENT ecal, PPIs twice a day  . Rosacea     Past Surgical History:  Procedure Laterality Date  . COLONOSCOPY  2009  . INGUINAL HERNIA REPAIR Left    2012    There were no vitals filed for this visit.  Subjective Assessment - 03/01/19 1224    Subjective  "I haven't played golf today, it's too early."  feels great; back hasn't been very painful but also hasn't been playing golf.    Pain Score  0-No pain                       [facility] Adult PT Treatment/Exercise - 03/01/19 1226      Lumbar Exercises: Stretches   Quadruped Mid Back Stretch  3 reps;30 seconds    Quadruped Mid Back Stretch Limitations  seated with green physioball to Lt for Rt side stretch    Other Lumbar Stretch Exercise  sidelying book openers 10x10 sec      Lumbar Exercises: Aerobic   Nustep  L6 x 6 min      Lumbar  Exercises: Prone   Opposite Arm/Leg Raise  Right arm/Left leg;Left arm/Right leg;10 reps;3 seconds      Manual Therapy   Manual Therapy  Soft tissue mobilization;Joint mobilization    Manual therapy comments  skilled palpation and monitoring of soft tissue during DN    Joint Mobilization  L2-4 CPA mobs grades 2-3; rib mobs L2-4 bil A/P grades 2-3    Soft tissue mobilization  Rt lats, paraspinals       Trigger Point Dry Needling - 03/01/19 1320    Consent Given?  Yes    Education Handout Provided  Previously provided    Muscles Treated Upper Quadrant  Latissimus dorsi    Latissimus dorsi Response  Twitch response elicited;Palpable increased muscle length                PT Long Term Goals - 02/14/19 1239      PT LONG TERM GOAL #1   Title  independent with HEP    Status  New    Target Date  03/28/19  PT LONG TERM GOAL #2   Title  perform thoracolumbar ROM without reports of pain    Status  New    Target Date  03/28/19      PT LONG TERM GOAL #3   Title  play golf without pain    Status  New    Target Date  03/28/19      PT LONG TERM GOAL #4   Title  improve FOTO =/< 32% limited    Status  New    Target Date  03/28/19      PT LONG TERM GOAL #5   Title  n/a            Plan - 03/01/19 1321    Clinical Impression Statement  Pt reports lower back pain has decreased, still with some pain in neck and shoulder area on Rt.  Asked for update to referral to treat area.  Overall progressing well and recommended he play some golf this weekend to see how he feels.  Will continue to benefit from PT to maximize function.    Personal Factors and Comorbidities  Age;Comorbidity 1    Comorbidities  arthritis    Examination-Activity Limitations  Bend;Other   twisting   Examination-Participation Restrictions  Other   golfing, recreation   Stability/Clinical Decision Making  Stable/Uncomplicated    Rehab Potential  Good    PT Frequency  1x / week    PT Duration  6  weeks    PT Treatment/Interventions  ADLs/Self Care Home Management;Cryotherapy;Traction;Moist Heat;Electrical Stimulation;Functional mobility training;Therapeutic exercise;Therapeutic activities;Patient/family education;Manual techniques;Dry needling;Taping    PT Next Visit Plan  assess response to DN, continue as indicated    PT Home Exercise Plan  Access Code: [email or web address]    Consulted and Agree with Plan of Care  Patient       Patient will benefit from skilled therapeutic intervention in order to improve the following deficits and impairments:  Pain, Postural dysfunction, Increased muscle spasms, Increased fascial restricitons, Impaired flexibility  Visit Diagnosis: 1. Pain in thoracic spine   2. Chronic right-sided low back pain without sciatica   3. Cramp and spasm        Problem List Patient Active Problem List   Diagnosis Date Noted  . Primary osteoarthritis of right hip 12/13/2018  . Plantar fasciitis, right 02/16/2018  . Primary osteoarthritis of right ankle 01/19/2018  . Shoulder impingement syndrome, left 10/01/2017  . Rib pain on right side 12/09/2015  . Right shoulder pain with history of right proximal biceps rupture 12/09/2015  . Essential hypertension, benign 10/21/2015  . Lumbar degenerative disc disease 09/02/2015  . DDD (degenerative disc disease), cervical 08/05/2015      Laureen Abrahams, PT, DPT 03/01/19 1:23 PM     [facility] [place], [place], [address] Phone: [phone]   Fax:  [phone]  Name: Alejandro Hicks MRN: [id number] Date of Birth: [birth year]

## 2019-03-06 ENCOUNTER — Encounter: Payer: Self-pay | Admitting: Physical Therapy

## 2019-03-14 ENCOUNTER — Other Ambulatory Visit: Payer: Self-pay

## 2019-03-14 ENCOUNTER — Ambulatory Visit (INDEPENDENT_AMBULATORY_CARE_PROVIDER_SITE_OTHER): Payer: Medicare Other | Admitting: Physical Therapy

## 2019-03-14 ENCOUNTER — Encounter: Payer: Self-pay | Admitting: Physical Therapy

## 2019-03-14 DIAGNOSIS — M545 Low back pain, unspecified: Secondary | ICD-10-CM

## 2019-03-14 DIAGNOSIS — R252 Cramp and spasm: Secondary | ICD-10-CM | POA: Diagnosis not present

## 2019-03-14 DIAGNOSIS — G8929 Other chronic pain: Secondary | ICD-10-CM | POA: Diagnosis not present

## 2019-03-14 DIAGNOSIS — M546 Pain in thoracic spine: Secondary | ICD-10-CM | POA: Diagnosis not present

## 2019-03-14 NOTE — Therapy (Addendum)
[place], [place], [address] Phone: [phone]   Fax:  [phone]  Physical Therapy Treatment/Discharge  Patient Details  Name: Alejandro Hicks MRN: [id number] Date of Birth: 09-15-1933 Referring Provider (PT): Silverio Decamp, MD   Encounter Date: 03/14/2019  PT End of Session - 03/14/19 1217    Visit Number  4    Number of Visits  6    Date for PT Re-Evaluation  03/28/19    PT Start Time  1130    PT Stop Time  1212    PT Time Calculation (min)  42 min    Activity Tolerance  Patient tolerated treatment well    Behavior During Therapy  [facility] for tasks assessed/performed       Past Medical History:  Diagnosis Date  . Allergic rhinitis   . Anxiety   . Asthma   . BPH (benign prostatic hypertrophy)   . Colonic polyp   . Dyslipidemia   . ED (erectile dysfunction)   . GERD (gastroesophageal reflux disease)   . Inguinal hernia    Left S/P repair in 2012  . Leukoplakia of vocal cords   . OA (osteoarthritis)   . OSA (obstructive sleep apnea)    Does not use CPAP  . Reflux laryngitis    ENT ecal, PPIs twice a day  . Rosacea     Past Surgical History:  Procedure Laterality Date  . COLONOSCOPY  2009  . INGUINAL HERNIA REPAIR Left    2012    There were no vitals filed for this visit.  Subjective Assessment - 03/14/19 1132    Subjective  doing well, back is feeling pretty good.    Patient Stated Goals  playing golf    Currently in Pain?  No/denies         [facility] PT Assessment - 03/14/19 1148      Assessment   Medical Diagnosis  M51.36 (ICD-10-CM) - Lumbar degenerative disc disease    Referring Provider (PT)  Silverio Decamp, MD      Observation/Other Assessments   Focus on Therapeutic Outcomes (FOTO)   74 (26% limited)      AROM   Overall AROM Comments  thoracolumbar ROM WNL without pain                   [facility] Adult PT Treatment/Exercise - 03/14/19  1137      Lumbar Exercises: Stretches   Press Ups  5 reps;20 seconds    Quadruped Mid Back Stretch  3 reps;30 seconds    Quadruped Mid Back Stretch Limitations  seated with green physioball to Lt for Rt side stretch      Lumbar Exercises: Aerobic   Nustep  L6 x 6 min      Manual Therapy   Manual Therapy  Soft tissue mobilization    Manual therapy comments  skilled palpation and monitoring of soft tissue during DN    Soft tissue mobilization  Rt QL and lumbar paraspinals       Trigger Point Dry Needling - 03/14/19 0001    Consent Given?  Yes    Education Handout Provided  Previously provided    Muscles Treated Back/Hip  Quadratus lumborum;Erector spinae    Erector spinae Response  Twitch response elicited;Palpable increased muscle length    Quadratus Lumborum Response  Palpable increased muscle length;Twitch response elicited                PT  Long Term Goals - 03/14/19 1217      PT LONG TERM GOAL #1   Title  independent with HEP    Status  Achieved      PT LONG TERM GOAL #2   Title  perform thoracolumbar ROM without reports of pain    Status  Achieved      PT LONG TERM GOAL #3   Title  play golf without pain    Status  Achieved      PT LONG TERM GOAL #4   Title  improve FOTO =/< 32% limited    Status  Achieved      PT LONG TERM GOAL #5   Title  n/a            Plan - 03/14/19 1217    Clinical Impression Statement  Pt has met all goal at this time.  C/O increased Rt neck and shoulder pain and pt to see MD tomorrow to follow up, and recommended he discuss with MD about PT referral for this.    Personal Factors and Comorbidities  Age;Comorbidity 1    Comorbidities  arthritis    Examination-Activity Limitations  Bend;Other   twisting   Examination-Participation Restrictions  Other   golfing, recreation   Stability/Clinical Decision Making  Stable/Uncomplicated    Rehab Potential  Good    PT Frequency  1x / week    PT Duration  6 weeks    PT  Treatment/Interventions  ADLs/Self Care Home Management;Cryotherapy;Traction;Moist Heat;Electrical Stimulation;Functional mobility training;Therapeutic exercise;Therapeutic activities;Patient/family education;Manual techniques;Dry needling;Taping    PT Next Visit Plan  assess response to DN, continue as indicated    PT Home Exercise Plan  Access Code: [email or web address]    Consulted and Agree with Plan of Care  Patient       Patient will benefit from skilled therapeutic intervention in order to improve the following deficits and impairments:  Pain, Postural dysfunction, Increased muscle spasms, Increased fascial restricitons, Impaired flexibility  Visit Diagnosis: 1. Pain in thoracic spine   2. Chronic right-sided low back pain without sciatica   3. Cramp and spasm        Problem List Patient Active Problem List   Diagnosis Date Noted  . Primary osteoarthritis of right hip 12/13/2018  . Plantar fasciitis, right 02/16/2018  . Primary osteoarthritis of right ankle 01/19/2018  . Shoulder impingement syndrome, left 10/01/2017  . Rib pain on right side 12/09/2015  . Right shoulder pain with history of right proximal biceps rupture 12/09/2015  . Essential hypertension, benign 10/21/2015  . Lumbar degenerative disc disease 09/02/2015  . DDD (degenerative disc disease), cervical 08/05/2015      Laureen Abrahams, PT, DPT 03/14/19 12:19 PM     [facility] [place], [place], [address] Phone: [phone]   Fax:  [phone]  Name: Alejandro Hicks MRN: [id number] Date of Birth: [birth year]      PHYSICAL THERAPY DISCHARGE SUMMARY  Visits from Start of Care: 4  Current functional level related to goals / functional outcomes: See above   Remaining deficits: See above   Education / Equipment: HEP  Plan: Patient agrees to discharge.  Patient goals were met. Patient is being discharged due to meeting the  stated rehab goals.  ?????    Laureen Abrahams, PT, DPT 04/17/19 3:45 PM    [facility] [place], [place] [address]  [phone] (office) [phone] (fax)

## 2019-03-15 ENCOUNTER — Ambulatory Visit (INDEPENDENT_AMBULATORY_CARE_PROVIDER_SITE_OTHER): Payer: Medicare Other | Admitting: Sports Medicine

## 2019-03-15 ENCOUNTER — Encounter: Payer: Self-pay | Admitting: Sports Medicine

## 2019-03-15 DIAGNOSIS — S46211D Strain of muscle, fascia and tendon of other parts of biceps, right arm, subsequent encounter: Secondary | ICD-10-CM | POA: Diagnosis not present

## 2019-03-15 NOTE — Progress Notes (Signed)
Subjective:    CC: Right shoulder pain  HPI: This is a pleasant 85 year old male, he has known right shoulder glenohumeral osteoarthritis, previous injection was exactly 3 months ago, now having a recurrence of pain, moderate, persistent, localized in the posterior joint line without radiation.  I reviewed the past medical history, family history, social history, surgical history, and allergies today and no changes were needed.  Please see the problem list section below in epic for further details.  Past Medical History: Past Medical History:  Diagnosis Date  . Allergic rhinitis   . Anxiety   . Asthma   . BPH (benign prostatic hypertrophy)   . Colonic polyp   . Dyslipidemia   . ED (erectile dysfunction)   . GERD (gastroesophageal reflux disease)   . Inguinal hernia    Left S/P repair in 2012  . Leukoplakia of vocal cords   . OA (osteoarthritis)   . OSA (obstructive sleep apnea)    Does not use CPAP  . Reflux laryngitis    ENT ecal, PPIs twice a day  . Rosacea    Past Surgical History: Past Surgical History:  Procedure Laterality Date  . COLONOSCOPY  2009  . INGUINAL HERNIA REPAIR Left    2012   Social History: Social History   Socioeconomic History  . Marital status: Single    Spouse name: Not on file  . Number of children: Not on file  . Years of education: Not on file  . Highest education level: Not on file  Occupational History  . Not on file  Social Needs  . Financial resource strain: Not on file  . Food insecurity    Worry: Not on file    Inability: Not on file  . Transportation needs    Medical: Not on file    Non-medical: Not on file  Tobacco Use  . Smoking status: Former Research scientist (life sciences)  . Smokeless tobacco: Never Used  . Tobacco comment: Quit 1970  Substance and Sexual Activity  . Alcohol use: Yes    Comment: social  . Drug use: No  . Sexual activity: Not on file  Lifestyle  . Physical activity    Days per week: Not on file    Minutes per session:  Not on file  . Stress: Not on file  Relationships  . Social Herbalist on phone: Not on file    Gets together: Not on file    Attends religious service: Not on file    Active member of club or organization: Not on file    Attends meetings of clubs or organizations: Not on file    Relationship status: Not on file  Other Topics Concern  . Not on file  Social History Narrative  . Not on file   Family History: No family history on file. Allergies: Allergies  Allergen Reactions  . Lipitor [Atorvastatin]     Myalgia  . Zocor [Simvastatin]     Myalgia   Medications: See med rec.  Review of Systems: No fevers, chills, night sweats, weight loss, chest pain, or shortness of breath.   Objective:    General: Well Developed, well nourished, and in no acute distress.  Neuro: Alert and oriented x3, extra-ocular muscles intact, sensation grossly intact.  HEENT: Normocephalic, atraumatic, pupils equal round reactive to light, neck supple, no masses, no lymphadenopathy, thyroid nonpalpable.  Skin: Warm and dry, no rashes. Cardiac: Regular rate and rhythm, no murmurs rubs or gallops, no lower extremity edema.  Respiratory:  Clear to auscultation bilaterally. Not using accessory muscles, speaking in full sentences.  Procedure: Real-time Ultrasound Guided injection of the right glenohumeral joint Device: GE Logiq E  Verbal informed consent obtained.  Time-out conducted.  Noted no overlying erythema, induration, or other signs of local infection.  Skin prepped in a sterile fashion.  Local anesthesia: Topical Ethyl chloride.  With sterile technique and under real time ultrasound guidance:  Using a posterior approach and taking care to avoid the labrum I guided a 22-gauge spinal needle into the glenohumeral joint and injected 1 cc Kenalog 40, 2 cc lidocaine, 2 cc bupivacaine. Completed without difficulty  Pain immediately resolved suggesting accurate placement of the medication.   Advised to call if fevers/chills, erythema, induration, drainage, or persistent bleeding.  Images permanently stored and available for review in the ultrasound unit.  Impression: Technically successful ultrasound guided injection.  Impression and Recommendations:    Right shoulder pain with history of right proximal biceps rupture Repeat right glenohumeral injection. Return to see me as needed, previous injection was in April 2020.   ___________________________________________ [name], M.D., ABFM., CAQSM. Primary Care and Sports Medicine [facility] [place] [facility] [facility]  Adjunct Professor of Family Medicine  [employer]

## 2019-03-15 NOTE — Assessment & Plan Note (Signed)
Repeat right glenohumeral injection. Return to see me as needed, previous injection was in April 2020.

## 2019-05-15 ENCOUNTER — Other Ambulatory Visit: Payer: Self-pay | Admitting: *Deleted

## 2019-05-15 DIAGNOSIS — I10 Essential (primary) hypertension: Secondary | ICD-10-CM

## 2019-05-15 MED ORDER — VALSARTAN-HYDROCHLOROTHIAZIDE 320-25 MG PO TABS: 1.0000 | ORAL_TABLET | Freq: Every day | ORAL | 1 refills | Status: DC

## 2019-05-27 ENCOUNTER — Emergency Department: Payer: Medicare Other

## 2019-05-27 ENCOUNTER — Other Ambulatory Visit: Payer: Self-pay

## 2019-05-27 ENCOUNTER — Emergency Department
Admission: EM | Admit: 2019-05-27 | Discharge: 2019-05-28 | Disposition: A | Discharge status: Home / Self Care | Payer: Medicare Other | Attending: Emergency Medicine | Admitting: Emergency Medicine

## 2019-05-27 DIAGNOSIS — J45909 Unspecified asthma, uncomplicated: Secondary | ICD-10-CM | POA: Diagnosis not present

## 2019-05-27 DIAGNOSIS — Z79899 Other long term (current) drug therapy: Secondary | ICD-10-CM | POA: Insufficient documentation

## 2019-05-27 DIAGNOSIS — R0600 Dyspnea, unspecified: Secondary | ICD-10-CM | POA: Diagnosis present

## 2019-05-27 DIAGNOSIS — U071 COVID-19: Secondary | ICD-10-CM | POA: Diagnosis not present

## 2019-05-27 DIAGNOSIS — Z87891 Personal history of nicotine dependence: Secondary | ICD-10-CM | POA: Insufficient documentation

## 2019-05-27 DIAGNOSIS — I10 Essential (primary) hypertension: Secondary | ICD-10-CM | POA: Diagnosis not present

## 2019-05-27 LAB — CBC WITH DIFFERENTIAL/PLATELET
Abs Immature Granulocytes: 0.01 10*3/uL (ref 0.00–0.07)
Basophils Absolute: 0 10*3/uL (ref 0.0–0.1)
Basophils Relative: 0 %
Eosinophils Absolute: 0 10*3/uL (ref 0.0–0.5)
Eosinophils Relative: 0 %
HCT: 49.1 % (ref 39.0–52.0)
Hemoglobin: 16.2 g/dL (ref 13.0–17.0)
Immature Granulocytes: 0 %
Lymphocytes Relative: 16 %
Lymphs Abs: 0.9 10*3/uL (ref 0.7–4.0)
MCH: 28.5 pg (ref 26.0–34.0)
MCHC: 33 g/dL (ref 30.0–36.0)
MCV: 86.3 fL (ref 80.0–100.0)
Monocytes Absolute: 0.4 10*3/uL (ref 0.1–1.0)
Monocytes Relative: 6 %
Neutro Abs: 4.4 10*3/uL (ref 1.7–7.7)
Neutrophils Relative %: 78 %
Platelets: 118 10*3/uL — ABNORMAL LOW (ref 150–400)
RBC: 5.69 MIL/uL (ref 4.22–5.81)
RDW: 13.4 % (ref 11.5–15.5)
WBC: 5.8 10*3/uL (ref 4.0–10.5)
nRBC: 0 % (ref 0.0–0.2)

## 2019-05-27 LAB — COMPREHENSIVE METABOLIC PANEL
ALT: 45 U/L — ABNORMAL HIGH (ref 0–44)
AST: 60 U/L — ABNORMAL HIGH (ref 15–41)
Albumin: 3.5 g/dL (ref 3.5–5.0)
Alkaline Phosphatase: 63 U/L (ref 38–126)
Anion gap: 10 (ref 5–15)
BUN: 23 mg/dL (ref 8–23)
CO2: 26 mmol/L (ref 22–32)
Calcium: 8.6 mg/dL — ABNORMAL LOW (ref 8.9–10.3)
Chloride: 99 mmol/L (ref 98–111)
Creatinine, Ser: 1.04 mg/dL (ref 0.61–1.24)
GFR calc Af Amer: >60 mL/min (ref >60)
GFR calc non Af Amer: >60 mL/min (ref >60)
Glucose, Bld: 116 mg/dL — ABNORMAL HIGH (ref 70–99)
Potassium: 3.6 mmol/L (ref 3.5–5.1)
Sodium: 135 mmol/L (ref 135–145)
Total Bilirubin: 0.6 mg/dL (ref 0.3–1.2)
Total Protein: 6.8 g/dL (ref 6.5–8.1)

## 2019-05-27 LAB — TRIGLYCERIDES: Triglycerides: 127 mg/dL (ref <150)

## 2019-05-27 LAB — FIBRINOGEN: Fibrinogen: 501 mg/dL — ABNORMAL HIGH (ref 210–475)

## 2019-05-27 LAB — FIBRIN DERIVATIVES D-DIMER (ARMC ONLY): Fibrin derivatives D-dimer (ARMC): 744.04 ng/mL (FEU) — ABNORMAL HIGH (ref 0.00–499.00)

## 2019-05-27 MED ORDER — ONDANSETRON HCL 4 MG/2ML IJ SOLN
4.0000 mg | Freq: Once | INTRAMUSCULAR | Status: AC
  Administered 2019-05-27: 4 mg via INTRAVENOUS

## 2019-05-27 MED ORDER — ONDANSETRON HCL 4 MG/2ML IJ SOLN
INTRAMUSCULAR | Status: AC
  Filled 2019-05-27: qty 2

## 2019-05-27 MED ORDER — DEXAMETHASONE SODIUM PHOSPHATE 10 MG/ML IJ SOLN
10.0000 mg | Freq: Once | INTRAMUSCULAR | Status: AC
  Administered 2019-05-27: 10 mg via INTRAVENOUS
  Filled 2019-05-27: qty 1

## 2019-05-27 MED ORDER — SODIUM CHLORIDE 0.9 % IV BOLUS
1000.0000 mL | Freq: Once | INTRAVENOUS | Status: AC
  Administered 2019-05-27: 1000 mL via INTRAVENOUS

## 2019-05-27 MED ORDER — HYDROCOD POLST-CPM POLST ER 10-8 MG/5ML PO SUER
5.0000 mL | Freq: Once | ORAL | Status: AC
  Administered 2019-05-27: 5 mL via ORAL
  Filled 2019-05-27: qty 5

## 2019-05-27 NOTE — ED Triage Notes (Signed)
Pt states has covid-19. Pt states he has had a cough and fever for 6 days. Pt ambulatory without difficulty. Pt states his daughter "told me I needed to come to there emergency department because i'c coughing". Pt appears in no acute distress. Pt states one episode or emesis today.

## 2019-05-27 NOTE — ED Notes (Signed)
Spoke with dr. Joni Fears regarding pt's presenting complaint and vital signs, age, health history. No order for ekg or blood work received, order for chest xray received.

## 2019-05-27 NOTE — ED Provider Notes (Signed)
[facility] Emergency Department Provider Note _______   First MD Initiated Contact with Patient 05/27/19 2318     (approximate)  I have reviewed the triage vital signs and the nursing notes.   HISTORY  Chief Complaint COVID    HPI Alejandro Hicks is a 85 y.o. male with below list of previous medical conditions including recently diagnosed COVID-19 infection 3 days ago presents to the emergency department secondary to progressive dyspnea cough generalized myalgia and fatigue.  Patient states that he acquired COVID-19 last week Friday while at a golf tournament and has had progressive symptoms since onset.  Patient states that he was tested on Wednesday notified immediately that he was indeed positive.  Patient also admits to vomiting however no diarrhea.  Patient also admits to ongoing fever.  Patient admits to oxygen saturation at home being 90%.        Past Medical History:  Diagnosis Date  . Allergic rhinitis   . Anxiety   . Asthma   . BPH (benign prostatic hypertrophy)   . Colonic polyp   . Dyslipidemia   . ED (erectile dysfunction)   . GERD (gastroesophageal reflux disease)   . Inguinal hernia    Left S/P repair in 2012  . Leukoplakia of vocal cords   . OA (osteoarthritis)   . OSA (obstructive sleep apnea)    Does not use CPAP  . Reflux laryngitis    ENT ecal, PPIs twice a day  . Rosacea     Patient Active Problem List   Diagnosis Date Noted  . Primary osteoarthritis of right hip 12/13/2018  . Plantar fasciitis, right 02/16/2018  . Primary osteoarthritis of right ankle 01/19/2018  . Shoulder impingement syndrome, left 10/01/2017  . Rib pain on right side 12/09/2015  . Right shoulder pain with history of right proximal biceps rupture 12/09/2015  . Essential hypertension, benign 10/21/2015  . Lumbar degenerative disc disease 09/02/2015  . DDD (degenerative disc disease), cervical 08/05/2015    Past Surgical History:  Procedure  Laterality Date  . COLONOSCOPY  2009  . INGUINAL HERNIA REPAIR Left    2012    Prior to Admission medications   Medication Sig Start Date End Date Taking? Authorizing Provider  albuterol (PROAIR HFA) 108 (90 Base) MCG/ACT inhaler Use 2 puffs every 4 hours as needed for cough and wheeze. May use 10-20 minutes before exercise 02/09/17   Kozlow, Alvira Philips, MD  ALPRAZolam Prudy Feeler) 0.25 MG tablet Take 0.25 mg by mouth 2 (two) times daily as needed for anxiety.    [provider]  azelastine (ASTELIN) 0.1 % nasal spray PLACE 1 SPRAY INTO BOTH NOSTRILS 2 (TWO) TIMES DAILY. 08/30/17   Kozlow, Alvira Philips, MD  celecoxib (CELEBREX) 200 MG capsule One to 2 tablets by mouth daily as needed for pain. 12/13/18   Monica Becton, MD  DEXILANT 60 MG capsule TAKE 1 CAPSULE (60 MG TOTAL) BY MOUTH EVERY MORNING. 12/23/17   Kozlow, Alvira Philips, MD  fluticasone (FLONASE) 50 MCG/ACT nasal spray Place 1 spray into both nostrils 2 (two) times daily. 02/09/17   Kozlow, Alvira Philips, MD  gabapentin (NEURONTIN) 300 MG capsule One tab PO qHS for a week, then BID for a week, then TID. May double weekly to a max of 3,600mg /day 10/03/18   Monica Becton, MD  Menaquinone-7 (VITAMIN K2 PO) Take by mouth daily.    [provider]  Nutritional Supplements (DHEA PO) Take by mouth daily.  [provider]  Pregnenolone Micronized POWD Use as directed in the mouth or throat daily.    [provider]  Spacer/Aero-Holding Chambers (AEROCHAMBER PLUS FLO-VU LARGE) MISC 1 each by Other route once. 11/11/15   Baxter Hire, MD  tadalafil (CIALIS) 20 MG tablet Take 5 mg by mouth daily as needed for erectile dysfunction.     [provider]  testosterone cypionate (DEPOTESTOSTERONE CYPIONATE) 200 MG/ML injection Inject 0.2 mg into the muscle once a week.  01/26/17   [provider]  valsartan-hydrochlorothiazide (DIOVAN-HCT) 320-25 MG tablet Take 1 tablet by mouth daily. 05/15/19   Monica Becton, MD  Vitamin D, Cholecalciferol, 1000 UNITS TABS Take by mouth daily.    [provider]    Allergies Lipitor [atorvastatin] and Zocor [simvastatin]  No family history on file.  Social History Social History   Tobacco Use  . Smoking status: Former Games developer  . Smokeless tobacco: Never Used  . Tobacco comment: Quit 1970  Substance Use Topics  . Alcohol use: Yes    Comment: social  . Drug use: No    Review of Systems Constitutional: No fever/chills Eyes: No visual changes. ENT: No sore throat. Cardiovascular: Denies chest pain. Respiratory: Positive for cough and dyspnea Gastrointestinal: No abdominal pain.  No nausea, no vomiting.  No diarrhea.  No constipation. Genitourinary: Negative for dysuria. Musculoskeletal: Negative for neck pain.  Negative for back pain. Integumentary: Negative for rash. Neurological: Negative for headaches, focal weakness or numbness.  ____________________________________________   PHYSICAL EXAM:  VITAL SIGNS: ED Triage Vitals  Enc Vitals Group     BP 05/27/19 2032 (!) 157/88     Pulse Rate 05/27/19 2032 71     Resp 05/27/19 2032 18     Temp 05/27/19 2032 98.1 F (36.7 C)     Temp Source 05/27/19 2032 Oral     SpO2 05/27/19 2032 96 %     Weight 05/27/19 2033 72.6 kg (160 lb)     Height 05/27/19 2033 1.676 m ( )     Head Circumference --      Peak Flow --      Pain Score 05/27/19 2032 0     Pain Loc --      Pain Edu? --      Excl. in GC? --     Constitutional: Alert and oriented.  Coughing Eyes: Conjunctivae are normal.  Head: Atraumatic. Mouth/Throat: Mucous membranes are moist. Neck: No stridor.  No meningeal signs.   Cardiovascular: Normal rate, regular rhythm. Good peripheral circulation. Grossly normal heart sounds. Respiratory: Tachypnea, diffuse rhonchi Gastrointestinal: Soft and nontender. No distention.  Musculoskeletal: No lower extremity tenderness nor edema. No gross deformities of extremities.  Neurologic:  Normal speech and language. No gross focal neurologic deficits are appreciated.  Skin:  Skin is warm, dry and intact. Psychiatric: Mood and affect are normal. Speech and behavior are normal.  ____________________________________________   LABS (all labs ordered are listed, but only abnormal results are displayed)  Labs Reviewed  COMPREHENSIVE METABOLIC PANEL - Abnormal; Notable for the following components:      Result Value   Glucose, Bld 116 (*)    Calcium 8.6 (*)    AST 60 (*)    ALT 45 (*)    All other components within normal limits  CBC WITH DIFFERENTIAL/PLATELET - Abnormal; Notable for the following components:   Platelets 118 (*)    All other components within normal limits  FIBRIN DERIVATIVES D-DIMER ([facility] ONLY) -  Abnormal; Notable for the following components:   Fibrin derivatives D-dimer ([facility]) 744.04 (*)    All other components within normal limits  FIBRINOGEN - Abnormal; Notable for the following components:   Fibrinogen 501 (*)    All other components within normal limits  LACTATE DEHYDROGENASE - Abnormal; Notable for the following components:   LDH 238 (*)    All other components within normal limits  CULTURE, BLOOD (ROUTINE X 2)  CULTURE, BLOOD (ROUTINE X 2)  LACTIC ACID, PLASMA  PROCALCITONIN  FERRITIN  TRIGLYCERIDES  URINALYSIS, COMPLETE (UACMP) WITH MICROSCOPIC  LACTIC ACID, PLASMA  C-REACTIVE PROTEIN   ____________________________________________  EKG  ED ECG REPORT I, [place] N BROWN, the attending physician, personally viewed and interpreted this ECG.   Date: 05/27/2019  EKG Time: 11:54 PM  Rate: 59  Rhythm: Sinus bradycardia  Axis: Normal  Intervals: Normal  ST&T Change: None  ____________________________________________  RADIOLOGY I, [place] N BROWN, personally viewed and evaluated these images (plain radiographs) as part of my medical decision making, as well as reviewing the written report by the radiologist.  ED MD  interpretation: Hazy peripheral airspace opacities in both lungs per radiologist on chest x-ray interpretation  Official radiology report(s): Dg Chest 2 View  Result Date: 05/27/2019 CLINICAL DATA:  Cough, covid EXAM: CHEST - 2 VIEW COMPARISON:  October 04, 2017 FINDINGS: The heart size and mediastinal contours are within normal limits. There is mildly increased hazy airspace opacity seen within the periphery of the right upper lung and the left lower lung. Aortic knob calcifications are seen. No acute osseous abnormality. IMPRESSION: Hazy peripheral airspace opacity is within both lungs. The findings in the lungs are nonspecific, but concerning for atypical infection, which includes viral pneumonia. Electronically Signed   By: Prudencio Pair M.D.   On: 05/27/2019 21:23      Procedures   ____________________________________________   INITIAL IMPRESSION / MDM / [place] / ED COURSE  As part of my medical decision making, I reviewed the following data within the [place] NUMBER  85 year old male presented with above-stated history and physical exam secondary to COVID-19 infection with respiratory difficulty.  Chest x-ray consistent with COVID-19 laboratory data likewise.  Patient given Decadron 10 mg IV as well as Tussionex in the emergency department.  2 L nasal cannula oxygen applied.  Patient discussed with Dr. Shanon Brow admitting physician at [facility] for admission for observation given patient's progressive dyspnea oxygen saturation at 90% at rest.  Dr. Shanon Brow accepted the patient in transfer  ____________________________________________  FINAL CLINICAL IMPRESSION(S) / ED DIAGNOSES  Final diagnoses:  COVID-19 virus infection     MEDICATIONS GIVEN DURING THIS VISIT:  Medications  dexamethasone (DECADRON) injection 10 mg (10 mg Intravenous Given 05/27/19 2351)  sodium chloride 0.9 % bolus 1,000 mL (0 mLs Intravenous Stopped 05/28/19 0206)   chlorpheniramine-HYDROcodone (TUSSIONEX) 10-8 MG/5ML suspension 5 mL (5 mLs Oral Given 05/27/19 2359)  ondansetron (ZOFRAN) injection 4 mg (4 mg Intravenous Given 05/27/19 2359)     ED Discharge Orders    None      *Please note:  Alejandro Hicks was evaluated in Emergency Department on 05/28/2019 for the symptoms described in the history of present illness. He was evaluated in the context of the global COVID-19 pandemic, which necessitated consideration that the patient might be at risk for infection with the SARS-CoV-2 virus that causes COVID-19. Institutional protocols and algorithms that pertain to the evaluation of patients at risk for COVID-19 are in a  state of rapid change based on information released by regulatory bodies including the CDC and federal and state organizations. These policies and algorithms were followed during the patient's care in the ED.  Some ED evaluations and interventions may be delayed as a result of limited staffing during the pandemic.*  Note:  This document was prepared using Dragon voice recognition software and may include unintentional dictation errors.   Darci CurrentBrown, [place] N, MD 05/28/19 [phone]

## 2019-05-28 ENCOUNTER — Encounter (HOSPITAL_COMMUNITY): Payer: Self-pay

## 2019-05-28 ENCOUNTER — Inpatient Hospital Stay (HOSPITAL_COMMUNITY)
Admission: AD | Admit: 2019-05-28 | Discharge: 2019-06-06 | DRG: 177 | Disposition: A | Discharge status: Home / Self Care | Payer: Medicare Other | Source: Other Acute Inpatient Hospital | Attending: Internal Medicine | Admitting: Internal Medicine

## 2019-05-28 DIAGNOSIS — Z87891 Personal history of nicotine dependence: Secondary | ICD-10-CM

## 2019-05-28 DIAGNOSIS — K219 Gastro-esophageal reflux disease without esophagitis: Secondary | ICD-10-CM | POA: Diagnosis present

## 2019-05-28 DIAGNOSIS — I10 Essential (primary) hypertension: Secondary | ICD-10-CM | POA: Diagnosis not present

## 2019-05-28 DIAGNOSIS — J1289 Other viral pneumonia: Secondary | ICD-10-CM | POA: Diagnosis not present

## 2019-05-28 DIAGNOSIS — G4733 Obstructive sleep apnea (adult) (pediatric): Secondary | ICD-10-CM | POA: Diagnosis not present

## 2019-05-28 DIAGNOSIS — Z79899 Other long term (current) drug therapy: Secondary | ICD-10-CM | POA: Diagnosis not present

## 2019-05-28 DIAGNOSIS — U071 COVID-19: Secondary | ICD-10-CM | POA: Diagnosis present

## 2019-05-28 DIAGNOSIS — F419 Anxiety disorder, unspecified: Secondary | ICD-10-CM | POA: Diagnosis not present

## 2019-05-28 DIAGNOSIS — J9601 Acute respiratory failure with hypoxia: Secondary | ICD-10-CM | POA: Diagnosis not present

## 2019-05-28 DIAGNOSIS — J45909 Unspecified asthma, uncomplicated: Secondary | ICD-10-CM | POA: Diagnosis not present

## 2019-05-28 DIAGNOSIS — Z7951 Long term (current) use of inhaled steroids: Secondary | ICD-10-CM | POA: Diagnosis not present

## 2019-05-28 DIAGNOSIS — J1282 Pneumonia due to coronavirus disease 2019: Secondary | ICD-10-CM | POA: Diagnosis present

## 2019-05-28 DIAGNOSIS — N400 Enlarged prostate without lower urinary tract symptoms: Secondary | ICD-10-CM | POA: Diagnosis not present

## 2019-05-28 DIAGNOSIS — Z888 Allergy status to other drugs, medicaments and biological substances status: Secondary | ICD-10-CM

## 2019-05-28 DIAGNOSIS — G629 Polyneuropathy, unspecified: Secondary | ICD-10-CM | POA: Diagnosis present

## 2019-05-28 DIAGNOSIS — R0602 Shortness of breath: Secondary | ICD-10-CM

## 2019-05-28 DIAGNOSIS — Z791 Long term (current) use of non-steroidal anti-inflammatories (NSAID): Secondary | ICD-10-CM | POA: Diagnosis not present

## 2019-05-28 LAB — DIFFERENTIAL
Abs Immature Granulocytes: 0.04 10*3/uL (ref 0.00–0.07)
Basophils Absolute: 0 10*3/uL (ref 0.0–0.1)
Basophils Relative: 0 %
Eosinophils Absolute: 0 10*3/uL (ref 0.0–0.5)
Eosinophils Relative: 0 %
Immature Granulocytes: 1 %
Lymphocytes Relative: 9 %
Lymphs Abs: 0.4 10*3/uL — ABNORMAL LOW (ref 0.7–4.0)
Monocytes Absolute: 0.1 10*3/uL (ref 0.1–1.0)
Monocytes Relative: 2 %
Neutro Abs: 4.4 10*3/uL (ref 1.7–7.7)
Neutrophils Relative %: 88 %

## 2019-05-28 LAB — D-DIMER, QUANTITATIVE: D-Dimer, Quant: 0.56 ug/mL-FEU — ABNORMAL HIGH (ref 0.00–0.50)

## 2019-05-28 LAB — TYPE AND SCREEN
ABO/RH(D): A POS
Antibody Screen: NEGATIVE

## 2019-05-28 LAB — COMPREHENSIVE METABOLIC PANEL
ALT: 56 U/L — ABNORMAL HIGH (ref 0–44)
AST: 76 U/L — ABNORMAL HIGH (ref 15–41)
Albumin: 3.4 g/dL — ABNORMAL LOW (ref 3.5–5.0)
Alkaline Phosphatase: 58 U/L (ref 38–126)
Anion gap: 6 (ref 5–15)
BUN: 24 mg/dL — ABNORMAL HIGH (ref 8–23)
CO2: 27 mmol/L (ref 22–32)
Calcium: 8.5 mg/dL — ABNORMAL LOW (ref 8.9–10.3)
Chloride: 105 mmol/L (ref 98–111)
Creatinine, Ser: 1.05 mg/dL (ref 0.61–1.24)
GFR calc Af Amer: >60 mL/min (ref >60)
GFR calc non Af Amer: >60 mL/min (ref >60)
Glucose, Bld: 142 mg/dL — ABNORMAL HIGH (ref 70–99)
Potassium: 5 mmol/L (ref 3.5–5.1)
Sodium: 138 mmol/L (ref 135–145)
Total Bilirubin: 0.5 mg/dL (ref 0.3–1.2)
Total Protein: 6.3 g/dL — ABNORMAL LOW (ref 6.5–8.1)

## 2019-05-28 LAB — CREATININE, SERUM
Creatinine, Ser: 1.06 mg/dL (ref 0.61–1.24)
GFR calc Af Amer: >60 mL/min (ref >60)
GFR calc non Af Amer: >60 mL/min (ref >60)

## 2019-05-28 LAB — CBC
HCT: 47.3 % (ref 39.0–52.0)
Hemoglobin: 15.5 g/dL (ref 13.0–17.0)
MCH: 29 pg (ref 26.0–34.0)
MCHC: 32.8 g/dL (ref 30.0–36.0)
MCV: 88.4 fL (ref 80.0–100.0)
Platelets: 114 10*3/uL — ABNORMAL LOW (ref 150–400)
RBC: 5.35 MIL/uL (ref 4.22–5.81)
RDW: 13.5 % (ref 11.5–15.5)
WBC: 4.7 10*3/uL (ref 4.0–10.5)
nRBC: 0 % (ref 0.0–0.2)

## 2019-05-28 LAB — PROCALCITONIN: Procalcitonin: <0.1 ng/mL

## 2019-05-28 LAB — LACTIC ACID, PLASMA: Lactic Acid, Venous: 1.2 mmol/L (ref 0.5–1.9)

## 2019-05-28 LAB — LACTATE DEHYDROGENASE: LDH: 238 U/L — ABNORMAL HIGH (ref 98–192)

## 2019-05-28 LAB — MAGNESIUM: Magnesium: 1.9 mg/dL (ref 1.7–2.4)

## 2019-05-28 LAB — C-REACTIVE PROTEIN
CRP: 7.3 mg/dL — ABNORMAL HIGH (ref <1.0)
CRP: 6.5 mg/dL — ABNORMAL HIGH (ref <1.0)

## 2019-05-28 LAB — FERRITIN: Ferritin: 101 ng/mL (ref 24–336)

## 2019-05-28 LAB — BRAIN NATRIURETIC PEPTIDE: B Natriuretic Peptide: 58.5 pg/mL (ref 0.0–100.0)

## 2019-05-28 MED ORDER — GABAPENTIN 300 MG PO CAPS
300.0000 mg | ORAL_CAPSULE | Freq: Two times a day (BID) | ORAL | Status: DC
  Administered 2019-05-29 (×2): 300 mg via ORAL
  Filled 2019-05-28 (×4): qty 1

## 2019-05-28 MED ORDER — AMLODIPINE BESYLATE 5 MG PO TABS
10.0000 mg | ORAL_TABLET | Freq: Every day | ORAL | Status: DC
  Administered 2019-05-28 – 2019-06-06 (×10): 10 mg via ORAL
  Filled 2019-05-28 (×11): qty 2

## 2019-05-28 MED ORDER — HYDROCOD POLST-CPM POLST ER 10-8 MG/5ML PO SUER
5.0000 mL | Freq: Two times a day (BID) | ORAL | Status: DC | PRN
  Administered 2019-05-28 – 2019-05-31 (×6): 5 mL via ORAL
  Filled 2019-05-28 (×7): qty 5

## 2019-05-28 MED ORDER — ACETAMINOPHEN 325 MG PO TABS: 650.0000 mg | ORAL_TABLET | Freq: Four times a day (QID) | ORAL | Status: DC | PRN

## 2019-05-28 MED ORDER — ONDANSETRON HCL 4 MG/2ML IJ SOLN: 4.0000 mg | Freq: Four times a day (QID) | INTRAMUSCULAR | Status: DC | PRN

## 2019-05-28 MED ORDER — SODIUM CHLORIDE 0.9% FLUSH
3.0000 mL | INTRAVENOUS | Status: DC | PRN
  Administered 2019-05-31: 3 mL via INTRAVENOUS
  Filled 2019-05-28: qty 3

## 2019-05-28 MED ORDER — PANTOPRAZOLE SODIUM 40 MG PO TBEC
40.0000 mg | DELAYED_RELEASE_TABLET | Freq: Every day | ORAL | Status: DC
  Administered 2019-05-28 – 2019-06-06 (×10): 40 mg via ORAL
  Filled 2019-05-28 (×11): qty 1

## 2019-05-28 MED ORDER — SODIUM CHLORIDE 0.9 % IV SOLN
250.0000 mL | INTRAVENOUS | Status: DC | PRN
  Administered 2019-05-31: 250 mL via INTRAVENOUS

## 2019-05-28 MED ORDER — DEXAMETHASONE 6 MG PO TABS
6.0000 mg | ORAL_TABLET | ORAL | Status: DC
  Administered 2019-05-28 – 2019-06-05 (×9): 6 mg via ORAL
  Filled 2019-05-28 (×9): qty 1

## 2019-05-28 MED ORDER — GUAIFENESIN-DM 100-10 MG/5ML PO SYRP
10.0000 mL | ORAL_SOLUTION | ORAL | Status: DC | PRN
  Administered 2019-05-28 – 2019-06-05 (×5): 10 mL via ORAL
  Filled 2019-05-28 (×5): qty 10

## 2019-05-28 MED ORDER — VITAMIN C 500 MG PO TABS
500.0000 mg | ORAL_TABLET | Freq: Every day | ORAL | Status: DC
  Administered 2019-05-28 – 2019-06-06 (×10): 500 mg via ORAL
  Filled 2019-05-28 (×10): qty 1

## 2019-05-28 MED ORDER — ALPRAZOLAM 0.5 MG PO TABS
0.2500 mg | ORAL_TABLET | Freq: Two times a day (BID) | ORAL | Status: DC | PRN
  Administered 2019-06-01 – 2019-06-02 (×2): 0.25 mg via ORAL
  Filled 2019-05-28 (×2): qty 1

## 2019-05-28 MED ORDER — ASPIRIN EC 81 MG PO TBEC
81.0000 mg | DELAYED_RELEASE_TABLET | Freq: Every day | ORAL | Status: DC
  Administered 2019-05-28 – 2019-06-06 (×10): 81 mg via ORAL
  Filled 2019-05-28 (×11): qty 1

## 2019-05-28 MED ORDER — ZINC SULFATE 220 (50 ZN) MG PO CAPS
220.0000 mg | ORAL_CAPSULE | Freq: Every day | ORAL | Status: DC
  Administered 2019-05-28 – 2019-06-06 (×10): 220 mg via ORAL
  Filled 2019-05-28 (×11): qty 1

## 2019-05-28 MED ORDER — ONDANSETRON HCL 4 MG PO TABS: 4.0000 mg | ORAL_TABLET | Freq: Four times a day (QID) | ORAL | Status: DC | PRN

## 2019-05-28 MED ORDER — SODIUM CHLORIDE 0.9 % IV SOLN
100.0000 mg | INTRAVENOUS | Status: AC
  Administered 2019-05-29 – 2019-06-01 (×4): 100 mg via INTRAVENOUS
  Filled 2019-05-28 (×4): qty 20

## 2019-05-28 MED ORDER — ENOXAPARIN SODIUM 40 MG/0.4ML ~~LOC~~ SOLN
40.0000 mg | SUBCUTANEOUS | Status: DC
  Administered 2019-05-28 – 2019-06-06 (×10): 40 mg via SUBCUTANEOUS
  Filled 2019-05-28 (×10): qty 0.4

## 2019-05-28 MED ORDER — SODIUM CHLORIDE 0.9 % IV SOLN
200.0000 mg | Freq: Once | INTRAVENOUS | Status: AC
  Administered 2019-05-28: 12:00:00 200 mg via INTRAVENOUS
  Filled 2019-05-28: qty 40

## 2019-05-28 MED ORDER — VITAMIN D 25 MCG (1000 UNIT) PO TABS
1000.0000 [IU] | ORAL_TABLET | Freq: Every day | ORAL | Status: DC
  Administered 2019-05-28 – 2019-06-06 (×10): 1000 [IU] via ORAL
  Filled 2019-05-28 (×10): qty 1

## 2019-05-28 NOTE — Progress Notes (Signed)
Mr. Alejandro Hicks refused his gabapentin.  He said he stopped taking it "a while ago."

## 2019-05-28 NOTE — H&P (Signed)
History and Physical    Alejandro Hicks WUJ:[id number] DOB: 1933-10-15 DOA: 05/28/2019  PCP: Georgann HousekeeperHusain, Karrar, MD  Patient coming from: home  Chief Complaint:  Sob, fever, cough  HPI: Alejandro SheenBobby W Hicks is a 85 y.o. male with medical history significant of htn comes in with 3 days of sob cough tested positive for covid 19 about 3 days ago with worsening sob and cough.  Pt had oximeter at home lowest of 90% on RA.  Vomited once, no diarrhea.  No abd pain or chest pain.  [facility] for covid pna and sob.  He is currently comfortable on 2 liters [place].  Review of Systems: As per HPI otherwise 10 point review of systems negative.   Past Medical History:  Diagnosis Date  . Allergic rhinitis   . Anxiety   . Asthma   . BPH (benign prostatic hypertrophy)   . Colonic polyp   . Dyslipidemia   . ED (erectile dysfunction)   . GERD (gastroesophageal reflux disease)   . Inguinal hernia    Left S/P repair in 2012  . Leukoplakia of vocal cords   . OA (osteoarthritis)   . OSA (obstructive sleep apnea)    Does not use CPAP  . Reflux laryngitis    ENT ecal, PPIs twice a day  . Rosacea     Past Surgical History:  Procedure Laterality Date  . COLONOSCOPY  2009  . INGUINAL HERNIA REPAIR Left    2012     reports that he has quit smoking. He has never used smokeless tobacco. He reports current alcohol use. He reports that he does not use drugs.  Allergies  Allergen Reactions  . Lipitor [Atorvastatin]     Myalgia  . Zocor [Simvastatin]     Myalgia    History reviewed. No pertinent family history. no premature CAD  Prior to Admission medications   Medication Sig Start Date End Date Taking? Authorizing Provider  albuterol (PROAIR HFA) 108 (90 Base) MCG/ACT inhaler Use 2 puffs every 4 hours as needed for cough and wheeze. May use 10-20 minutes before exercise 02/09/17   Kozlow, Alvira PhilipsEric J, MD  ALPRAZolam Prudy Feeler(XANAX) 0.25 MG tablet Take 0.25 mg by mouth 2 (two) times daily as needed for  anxiety.    [provider]  azelastine (ASTELIN) 0.1 % nasal spray PLACE 1 SPRAY INTO BOTH NOSTRILS 2 (TWO) TIMES DAILY. 08/30/17   Kozlow, Alvira PhilipsEric J, MD  celecoxib (CELEBREX) 200 MG capsule One to 2 tablets by mouth daily as needed for pain. 12/13/18   Monica Bectonhekkekandam, Thomas J, MD  DEXILANT 60 MG capsule TAKE 1 CAPSULE (60 MG TOTAL) BY MOUTH EVERY MORNING. 12/23/17   Kozlow, Alvira PhilipsEric J, MD  fluticasone (FLONASE) 50 MCG/ACT nasal spray Place 1 spray into both nostrils 2 (two) times daily. 02/09/17   Kozlow, Alvira PhilipsEric J, MD  gabapentin (NEURONTIN) 300 MG capsule One tab PO qHS for a week, then BID for a week, then TID. May double weekly to a max of 3,600mg /day 10/03/18   Monica Bectonhekkekandam, Thomas J, MD  Menaquinone-7 (VITAMIN K2 PO) Take by mouth daily.    [provider]  Nutritional Supplements (DHEA PO) Take by mouth daily.    [provider]  Pregnenolone Micronized POWD Use as directed in the mouth or throat daily.    [provider]  Spacer/Aero-Holding Chambers (AEROCHAMBER PLUS FLO-VU LARGE) MISC 1 each by Other route once. 11/11/15   Baxter HireHicks, Roselyn M, MD  tadalafil (CIALIS) 20 MG tablet  Take 5 mg by mouth daily as needed for erectile dysfunction.     [provider]  testosterone cypionate (DEPOTESTOSTERONE CYPIONATE) 200 MG/ML injection Inject 0.2 mg into the muscle once a week.  01/26/17   [provider]  valsartan-hydrochlorothiazide (DIOVAN-HCT) 320-25 MG tablet Take 1 tablet by mouth daily. 05/15/19   Monica Becton, MD  Vitamin D, Cholecalciferol, 1000 UNITS TABS Take by mouth daily.    [provider]    Physical Exam: Vitals:   05/28/19 0315 05/28/19 0344  BP: (!) 147/75   Pulse: (!) 58   Resp: 19   Temp: 98.6 F (37 C)   TempSrc: Oral   SpO2: 97%   Weight:  72.4 kg  Height:  5\' 6"  (1.676 m)      Constitutional: NAD, calm, comfortable Vitals:   05/28/19 0315 05/28/19 0344  BP: (!) 147/75   Pulse: (!) 58   Resp: 19    Temp: 98.6 F (37 C)   TempSrc: Oral   SpO2: 97%   Weight:  72.4 kg  Height:  5\' 6"  (1.676 m)   Eyes: PERRL, lids and conjunctivae normal ENMT: Mucous membranes are moist. Posterior pharynx clear of any exudate or lesions.Normal dentition.  Neck: normal, supple, no masses, no thyromegaly Respiratory: clear to auscultation bilaterally, no wheezing, no crackles. Normal respiratory effort. No accessory muscle use.  Cardiovascular: Regular rate and rhythm, no murmurs / rubs / gallops. No extremity edema. 2+ pedal pulses. No carotid bruits.  Abdomen: no tenderness, no masses palpated. No hepatosplenomegaly. Bowel sounds positive.  Musculoskeletal: no clubbing / cyanosis. No joint deformity upper and lower extremities. Good ROM, no contractures. Normal muscle tone.  Skin: no rashes, lesions, ulcers. No induration Neurologic: CN 2-12 grossly intact. Sensation intact, DTR normal. Strength 5/5 in all 4.  Psychiatric: Normal judgment and insight. Alert and oriented x 3. Normal mood.    Labs on Admission: I have personally reviewed following labs and imaging studies  CBC: Recent Labs  Lab 05/27/19 2040  WBC 5.8  NEUTROABS 4.4  HGB 16.2  HCT 49.1  MCV 86.3  PLT 118*   Basic Metabolic Panel: Recent Labs  Lab 05/27/19 2040  NA 135  K 3.6  CL 99  CO2 26  GLUCOSE 116*  BUN 23  CREATININE 1.04  CALCIUM 8.6*   GFR: Estimated Creatinine Clearance: 49.4 mL/min (by C-G formula based on SCr of 1.04 mg/dL). Liver Function Tests: Recent Labs  Lab 05/27/19 2040  AST 60*  ALT 45*  ALKPHOS 63  BILITOT 0.6  PROT 6.8  ALBUMIN 3.5   No results for input(s): LIPASE, AMYLASE in the last 168 hours. No results for input(s): AMMONIA in the last 168 hours. Coagulation Profile: No results for input(s): INR, PROTIME in the last 168 hours. Cardiac Enzymes: No results for input(s): CKTOTAL, CKMB, CKMBINDEX, TROPONINI in the last 168 hours. BNP (last 3 results) No results for input(s):  PROBNP in the last 8760 hours. HbA1C: No results for input(s): HGBA1C in the last 72 hours. CBG: No results for input(s): GLUCAP in the last 168 hours. Lipid Profile: Recent Labs    05/27/19 2040  TRIG 127   Thyroid Function Tests: No results for input(s): TSH, T4TOTAL, FREET4, T3FREE, THYROIDAB in the last 72 hours. Anemia Panel: Recent Labs    05/27/19 2040  FERRITIN 101   Urine analysis:    Component Value Date/Time   COLORURINE YELLOW 05/30/2011 2051   APPEARANCEUR CLEAR 05/30/2011 2051   LABSPEC 1.022  05/30/2011 2051   PHURINE 6.5 05/30/2011 2051   GLUCOSEU NEGATIVE 05/30/2011 2051   HGBUR NEGATIVE 05/30/2011 2051   [place] NEGATIVE 05/30/2011 2051   [place] NEGATIVE 05/30/2011 2051   PROTEINUR NEGATIVE 05/30/2011 2051   UROBILINOGEN 0.2 05/30/2011 2051   NITRITE NEGATIVE 05/30/2011 2051   LEUKOCYTESUR NEGATIVE 05/30/2011 2051   Sepsis Labs: !!!!!!!!!!!!!!!!!!!!!!!!!!!!!!!!!!!!!!!!!!!! @LABRCNTIP (procalcitonin:4,lacticidven:4) ) Recent Results (from the past 240 hour(s))  Blood Culture (routine x 2)     Status: None (Preliminary result)   Collection Time: 05/27/19 11:54 PM   Specimen: BLOOD  Result Value Ref Range Status   Specimen Description BLOOD RIGHT ANTECUBITAL  Final   Special Requests   Final    BOTTLES DRAWN AEROBIC AND ANAEROBIC Blood Culture adequate volume   Culture   Final    NO GROWTH < 12 HOURS Performed at [facility], [address] [place], [place] [address]    Report Status PENDING  Incomplete  Blood Culture (routine x 2)     Status: None (Preliminary result)   Collection Time: 05/27/19 11:54 PM   Specimen: BLOOD  Result Value Ref Range Status   Specimen Description BLOOD RIGHT HAND  Final   Special Requests   Final    BOTTLES DRAWN AEROBIC AND ANAEROBIC Blood Culture adequate volume   Culture   Final    NO GROWTH < 12 HOURS Performed at [facility], [address] [place], [place] [address]    Report  Status PENDING  Incomplete     Radiological Exams on Admission: Dg Chest 2 View  Result Date: 05/27/2019 CLINICAL DATA:  Cough, covid EXAM: CHEST - 2 VIEW COMPARISON:  October 04, 2017 FINDINGS: The heart size and mediastinal contours are within normal limits. There is mildly increased hazy airspace opacity seen within the periphery of the right upper lung and the left lower lung. Aortic knob calcifications are seen. No acute osseous abnormality. IMPRESSION: Hazy peripheral airspace opacity is within both lungs. The findings in the lungs are nonspecific, but concerning for atypical infection, which includes viral pneumonia. Electronically Signed   By: Prudencio Pair M.D.   On: 05/27/2019 21:23   Case discussed with dr brown at [facility] chart reviewed  Assessment/Plan 85 yo male with covid 19 pna bilateral  Principal Problem:   Pneumonia due to 2019-nCoV- place on decadron 6mg  iv q 24 hour first dose given at [place].  Lovenox.  Vit c/zinc/aspirin.  Supplemental 02 as needed.  Ambulate and see if becomes hypoxic.  Does not qualify for remdisivir or actemra yet.  Vss., nml 02 sats.  Active Problems:   Essential hypertension, benign- stable      DVT prophylaxis: lovenox Code Status:  full Family Communication:  none Disposition Plan:  1-3 days Consults called:  none Admission status:  observation   Avnoor Koury A MD [facility]  If 7PM-7AM, please contact night-coverage [email or web address] Password [email or web address]  05/28/2019, 6:20 AM

## 2019-05-28 NOTE — ED Notes (Addendum)
PT GIVES VERBAL CONSENT FOR TRANSFER 

## 2019-05-28 NOTE — ED Notes (Signed)
Attempted to call report to [facility] at this time

## 2019-05-28 NOTE — Progress Notes (Signed)
PROGRESS NOTE                                                                                                                                                                                                             Patient Demographics:    Alejandro Hicks, is a 85 y.o. male, DOB - 1934/05/21, TDV:[id number]  Outpatient Primary MD for the patient is Wenda Low, MD    LOS - 0  Admit date - 05/28/2019    CC- cough, shortness of breath     Brief Narrative - 85 year old Caucasian male with history of hypertension, asthma, dyslipidemia who went to [facility] with family and friends on a vacation and came back with some cough and shortness of breath which started around 3 days ago, he was then checked for COVID-19 and was +3 days ago.  He developed mild nausea vomiting and diarrhea but subsequently developed some exertional shortness of breath and was then admitted to the hospital.   Subjective:    [name] today has, No headache, No chest pain, No abdominal pain - No Nausea, No new weakness tingling or numbness, +ve Cough and positive exertional SOB.   Assessment  & Plan :     1.  Acute Covid 19 Viral Pneumonitis during the ongoing 2020 Covid 19 Pandemic - he is of exertional shortness of breath along with positive infiltrate with a positive COVID-19 infection, he will be placed on low-dose steroid along with IV Remdisvir.  Monitor inflammatory markers closely.  Monitor clinically.  Encouraged to sit up in chair use I-S and flutter valve for pulmonary toiletry and prone in bed when at night.   COVID-19 Labs  Recent Labs    05/27/19 2040 05/27/19 2354 05/28/19 0600  DDIMER  --   --  0.56*  FERRITIN 101  --   --   LDH  --  238*  --   CRP 7.3*  --  6.5*    No results found for: SARSCOV2NAA   Hepatic Function Latest Ref Rng & Units 05/28/2019 05/27/2019  Total Protein 6.5 - 8.1 g/dL 6.3(L) 6.8  Albumin 3.5 - 5.0  g/dL 3.4(L) 3.5  AST 15 - 41 U/L 76(H) 60(H)  ALT 0 - 44 U/L 56(H) 45(H)  Alk Phosphatase 38 - 126 U/L 58 63  Total Bilirubin 0.3 - 1.2 mg/dL 0.5  0.6        Component Value Date/Time   BNP 58.5 05/28/2019 0600      2.  GERD.  Placed on PPI.  3.  Hypertension.  For now he has been placed on Norvasc.  4.  Peripheral neuropathy.  Continue Neurontin.    Condition - Fair  Family Communication  :  None  Code Status : Full  Diet :   Diet Order            Diet Heart Room service appropriate? Yes; Fluid consistency: Thin  Diet effective now               Disposition Plan  :  Inpt  Consults  :  None  Procedures  :   PUD Prophylaxis :    DVT Prophylaxis  :  Lovenox   Lab Results  Component Value Date   PLT 114 (L) 05/28/2019    Inpatient Medications  Scheduled Meds: . aspirin EC  81 mg Oral Daily  . dexamethasone  6 mg Oral Q24H  . enoxaparin (LOVENOX) injection  40 mg Subcutaneous Q24H  . vitamin C  500 mg Oral Daily  . zinc sulfate  220 mg Oral Daily   Continuous Infusions: . sodium chloride    . remdesivir 200 mg in NS 250 mL     Followed by  . [START ON 05/29/2019] remdesivir 100 mg in NS 250 mL     PRN Meds:.sodium chloride, acetaminophen, chlorpheniramine-HYDROcodone, guaiFENesin-dextromethorphan, ondansetron **OR** ondansetron (ZOFRAN) IV, sodium chloride flush  Antibiotics  :    Anti-infectives (From admission, onward)   Start     Dose/Rate Route Frequency Ordered Stop   05/29/19 1000  remdesivir 100 mg in sodium chloride 0.9 % 250 mL IVPB     100 mg 500 mL/hr over 30 Minutes Intravenous Every 24 hours 05/28/19 0809 06/02/19 0959   05/28/19 0900  remdesivir 200 mg in sodium chloride 0.9 % 250 mL IVPB     200 mg 500 mL/hr over 30 Minutes Intravenous Once 05/28/19 0809         Time Spent in minutes  30   Lala Lund M.D on 05/28/2019 at 9:38 AM  To page go to [email or web address] - password [facility]  [facility] -  Office   [phone]    See all Orders from today for further details    Objective:   Vitals:   05/28/19 0315 05/28/19 0344 05/28/19 0755 05/28/19 0802  BP: (!) 147/75  135/77   Pulse: (!) 58  62 67  Resp: 19     Temp: 98.6 F (37 C)  (!) 97.5 F (36.4 C)   TempSrc: Oral  Oral   SpO2: 97%  96% 94%  Weight:  72.4 kg    Height:  5' 6"  (1.676 m)      Wt Readings from Last 3 Encounters:  05/28/19 72.4 kg  05/27/19 72.6 kg  03/15/19 73.5 kg     Intake/Output Summary (Last 24 hours) at 05/28/2019 0938 Last data filed at 05/28/2019 0821 Gross per 24 hour  Intake -  Output 100 ml  Net -100 ml     Physical Exam  Awake Alert, Oriented X 3, No new F.N deficits, Normal affect [place].AT,PERRAL Supple Neck,No JVD, No cervical lymphadenopathy appriciated.  Symmetrical Chest wall movement, Good air movement bilaterally, CTAB RRR,No Gallops,Rubs or new Murmurs, No Parasternal Heave +ve B.Sounds, Abd Soft, No tenderness, No organomegaly appriciated, No rebound - guarding or rigidity. No Cyanosis, Clubbing or  edema, No new Rash or bruise       Data Review:    CBC Recent Labs  Lab 05/27/19 2040 05/28/19 0600  WBC 5.8 4.7  HGB 16.2 15.5  HCT 49.1 47.3  PLT 118* 114*  MCV 86.3 88.4  MCH 28.5 29.0  MCHC 33.0 32.8  RDW 13.4 13.5  LYMPHSABS 0.9  --   MONOABS 0.4  --   EOSABS 0.0  --   BASOSABS 0.0  --     Chemistries  Recent Labs  Lab 05/27/19 2040 05/28/19 0600  NA 135 138  K 3.6 5.0  CL 99 105  CO2 26 27  GLUCOSE 116* 142*  BUN 23 24*  CREATININE 1.04 1.05  1.06  CALCIUM 8.6* 8.5*  MG  --  1.9  AST 60* 76*  ALT 45* 56*  ALKPHOS 63 58  BILITOT 0.6 0.5   ------------------------------------------------------------------------------------------------------------------ Recent Labs    05/27/19 2040  TRIG 127    No results found for: HGBA1C ------------------------------------------------------------------------------------------------------------------ No  results for input(s): TSH, T4TOTAL, T3FREE, THYROIDAB in the last 72 hours.  Invalid input(s): FREET3  Cardiac Enzymes No results for input(s): CKMB, TROPONINI, MYOGLOBIN in the last 168 hours.  Invalid input(s): CK ------------------------------------------------------------------------------------------------------------------    Component Value Date/Time   BNP 58.5 05/28/2019 0600    Micro Results Recent Results (from the past 240 hour(s))  Blood Culture (routine x 2)     Status: None (Preliminary result)   Collection Time: 05/27/19 11:54 PM   Specimen: BLOOD  Result Value Ref Range Status   Specimen Description BLOOD RIGHT ANTECUBITAL  Final   Special Requests   Final    BOTTLES DRAWN AEROBIC AND ANAEROBIC Blood Culture adequate volume   Culture   Final    NO GROWTH < 12 HOURS Performed at [facility], [address] [place], [place] [address]    Report Status PENDING  Incomplete  Blood Culture (routine x 2)     Status: None (Preliminary result)   Collection Time: 05/27/19 11:54 PM   Specimen: BLOOD  Result Value Ref Range Status   Specimen Description BLOOD RIGHT HAND  Final   Special Requests   Final    BOTTLES DRAWN AEROBIC AND ANAEROBIC Blood Culture adequate volume   Culture   Final    NO GROWTH < 12 HOURS Performed at [facility], [address], [place], [place] [address]    Report Status PENDING  Incomplete    Radiology Reports Dg Chest 2 View  Result Date: 05/27/2019 CLINICAL DATA:  Cough, covid EXAM: CHEST - 2 VIEW COMPARISON:  October 04, 2017 FINDINGS: The heart size and mediastinal contours are within normal limits. There is mildly increased hazy airspace opacity seen within the periphery of the right upper lung and the left lower lung. Aortic knob calcifications are seen. No acute osseous abnormality. IMPRESSION: Hazy peripheral airspace opacity is within both lungs. The findings in the lungs are nonspecific, but concerning for  atypical infection, which includes viral pneumonia. Electronically Signed   By: Prudencio Pair M.D.   On: 05/27/2019 21:23

## 2019-05-28 NOTE — ED Notes (Signed)
Attempted to call report to [facility] at this time 

## 2019-05-29 DIAGNOSIS — Z888 Allergy status to other drugs, medicaments and biological substances status: Secondary | ICD-10-CM | POA: Diagnosis not present

## 2019-05-29 DIAGNOSIS — I10 Essential (primary) hypertension: Secondary | ICD-10-CM | POA: Diagnosis present

## 2019-05-29 DIAGNOSIS — Z791 Long term (current) use of non-steroidal anti-inflammatories (NSAID): Secondary | ICD-10-CM | POA: Diagnosis not present

## 2019-05-29 DIAGNOSIS — G4733 Obstructive sleep apnea (adult) (pediatric): Secondary | ICD-10-CM | POA: Diagnosis present

## 2019-05-29 DIAGNOSIS — Z7951 Long term (current) use of inhaled steroids: Secondary | ICD-10-CM | POA: Diagnosis not present

## 2019-05-29 DIAGNOSIS — J1289 Other viral pneumonia: Secondary | ICD-10-CM | POA: Diagnosis present

## 2019-05-29 DIAGNOSIS — Z79899 Other long term (current) drug therapy: Secondary | ICD-10-CM | POA: Diagnosis not present

## 2019-05-29 DIAGNOSIS — J9601 Acute respiratory failure with hypoxia: Secondary | ICD-10-CM | POA: Diagnosis present

## 2019-05-29 DIAGNOSIS — K219 Gastro-esophageal reflux disease without esophagitis: Secondary | ICD-10-CM | POA: Diagnosis present

## 2019-05-29 DIAGNOSIS — J45909 Unspecified asthma, uncomplicated: Secondary | ICD-10-CM | POA: Diagnosis present

## 2019-05-29 DIAGNOSIS — G629 Polyneuropathy, unspecified: Secondary | ICD-10-CM | POA: Diagnosis present

## 2019-05-29 DIAGNOSIS — F419 Anxiety disorder, unspecified: Secondary | ICD-10-CM | POA: Diagnosis present

## 2019-05-29 DIAGNOSIS — U071 COVID-19: Secondary | ICD-10-CM | POA: Diagnosis present

## 2019-05-29 DIAGNOSIS — Z87891 Personal history of nicotine dependence: Secondary | ICD-10-CM | POA: Diagnosis not present

## 2019-05-29 DIAGNOSIS — N400 Enlarged prostate without lower urinary tract symptoms: Secondary | ICD-10-CM | POA: Diagnosis present

## 2019-05-29 LAB — LACTATE DEHYDROGENASE: LDH: 313 U/L — ABNORMAL HIGH (ref 98–192)

## 2019-05-29 LAB — CBC WITH DIFFERENTIAL/PLATELET
Abs Immature Granulocytes: 0.03 10*3/uL (ref 0.00–0.07)
Basophils Absolute: 0 10*3/uL (ref 0.0–0.1)
Basophils Relative: 0 %
Eosinophils Absolute: 0 10*3/uL (ref 0.0–0.5)
Eosinophils Relative: 0 %
HCT: 47.3 % (ref 39.0–52.0)
Hemoglobin: 15.4 g/dL (ref 13.0–17.0)
Immature Granulocytes: 0 %
Lymphocytes Relative: 7 %
Lymphs Abs: 0.6 10*3/uL — ABNORMAL LOW (ref 0.7–4.0)
MCH: 28.6 pg (ref 26.0–34.0)
MCHC: 32.6 g/dL (ref 30.0–36.0)
MCV: 87.9 fL (ref 80.0–100.0)
Monocytes Absolute: 0.6 10*3/uL (ref 0.1–1.0)
Monocytes Relative: 7 %
Neutro Abs: 7 10*3/uL (ref 1.7–7.7)
Neutrophils Relative %: 86 %
Platelets: 126 10*3/uL — ABNORMAL LOW (ref 150–400)
RBC: 5.38 MIL/uL (ref 4.22–5.81)
RDW: 13.5 % (ref 11.5–15.5)
WBC: 8.2 10*3/uL (ref 4.0–10.5)
nRBC: 0 % (ref 0.0–0.2)

## 2019-05-29 LAB — COMPREHENSIVE METABOLIC PANEL
ALT: 62 U/L — ABNORMAL HIGH (ref 0–44)
AST: 81 U/L — ABNORMAL HIGH (ref 15–41)
Albumin: 3.2 g/dL — ABNORMAL LOW (ref 3.5–5.0)
Alkaline Phosphatase: 57 U/L (ref 38–126)
Anion gap: 11 (ref 5–15)
BUN: 25 mg/dL — ABNORMAL HIGH (ref 8–23)
CO2: 26 mmol/L (ref 22–32)
Calcium: 8.7 mg/dL — ABNORMAL LOW (ref 8.9–10.3)
Chloride: 102 mmol/L (ref 98–111)
Creatinine, Ser: 1.03 mg/dL (ref 0.61–1.24)
GFR calc Af Amer: >60 mL/min (ref >60)
GFR calc non Af Amer: >60 mL/min (ref >60)
Glucose, Bld: 123 mg/dL — ABNORMAL HIGH (ref 70–99)
Potassium: 4.7 mmol/L (ref 3.5–5.1)
Sodium: 139 mmol/L (ref 135–145)
Total Bilirubin: 0.3 mg/dL (ref 0.3–1.2)
Total Protein: 6.2 g/dL — ABNORMAL LOW (ref 6.5–8.1)

## 2019-05-29 LAB — MAGNESIUM: Magnesium: 2.1 mg/dL (ref 1.7–2.4)

## 2019-05-29 LAB — ABO/RH: ABO/RH(D): A POS

## 2019-05-29 LAB — BRAIN NATRIURETIC PEPTIDE: B Natriuretic Peptide: 50.5 pg/mL (ref 0.0–100.0)

## 2019-05-29 LAB — D-DIMER, QUANTITATIVE: D-Dimer, Quant: 0.51 ug/mL-FEU — ABNORMAL HIGH (ref 0.00–0.50)

## 2019-05-29 LAB — FERRITIN: Ferritin: 113 ng/mL (ref 24–336)

## 2019-05-29 LAB — C-REACTIVE PROTEIN: CRP: 4.3 mg/dL — ABNORMAL HIGH (ref <1.0)

## 2019-05-29 NOTE — Evaluation (Signed)
Physical Therapy Evaluation Patient Details Name: Alejandro Hicks MRN: [id number] DOB: [birth year] Today's Date: 05/29/2019   History of Present Illness  85 year old Caucasian male with history of hypertension, asthma, dyslipidemia who went to [facility] with family and friends on a vacation and came back with some cough and shortness of breath which started around 3 days ago, he was then checked for COVID-19 and was +3 days ago.  He developed mild nausea vomiting and diarrhea but subsequently developed some exertional shortness of breath and was then admitted to the hospital  Clinical Impression  The patient ambulated x 400' on RA. SpO2 after returning to room  SPO2 88% with less than 1 minute return to >92%. Dyspnea 2/4. Patient should progress to return home. Pt admitted with above diagnosis.  Pt currently with functional limitations due to the deficits listed below (see PT Problem List). Pt will benefit from skilled PT to increase their independence and safety with mobility to allow discharge to the venue listed below.     Follow Up Recommendations No PT follow up    Equipment Recommendations  None recommended by PT    Recommendations for Other Services       Precautions / Restrictions Precautions Precaution Comments: monitor sats      Mobility  Bed Mobility               General bed mobility comments: oob  Transfers Overall transfer level: Needs assistance Equipment used: None Transfers: Sit to/from Stand Sit to Stand: Supervision         General transfer comment: no assistance required  Ambulation/Gait Ambulation/Gait assistance: Min guard Gait Distance (Feet): 400 Feet Assistive device: None;1 person hand held assist(light support on upper arm , initially imbalanced) Gait Pattern/deviations: Step-through pattern;Drifts right/left        Stairs            Wheelchair Mobility    Modified Rankin (Stroke Patients Only)       Balance                                              Pertinent Vitals/Pain Pain Assessment: No/denies pain    Home Living Family/patient expects to be discharged to:: Private residence Living Arrangements: Other relatives Available Help at Discharge: Family Type of Home: House Home Access: Stairs to enter   Technical brewer of Steps: 1 Home Layout: Two level;Bed/bath upstairs Home Equipment: None      Prior Function Level of Independence: Independent               Hand Dominance        Extremity/Trunk Assessment   Upper Extremity Assessment Upper Extremity Assessment: Overall WFL for tasks assessed    Lower Extremity Assessment Lower Extremity Assessment: Generalized weakness    Cervical / Trunk Assessment Cervical / Trunk Assessment: Normal  Communication      Cognition Arousal/Alertness: Awake/alert Behavior During Therapy: WFL for tasks assessed/performed Overall Cognitive Status: Within Functional Limits for tasks assessed                                        General Comments      Exercises     Assessment/Plan    PT Assessment Patient needs continued PT services  PT Problem  List Decreased strength;Decreased activity tolerance;Cardiopulmonary status limiting activity;Decreased balance;Decreased knowledge of use of DME       PT Treatment Interventions Gait training;Functional mobility training;Stair training;Therapeutic exercise;Therapeutic activities;Patient/family education    PT Goals (Current goals can be found in the Care Plan section)  Acute Rehab PT Goals Patient Stated Goal: [place] home PT Goal Formulation: With patient Time For Goal Achievement: 06/12/19 Potential to Achieve Goals: Good    Frequency Min 3X/week   Barriers to discharge        Co-evaluation               AM-PAC PT "6 Clicks" Mobility  Outcome Measure Help needed turning from your back to your side while in a flat bed without using  bedrails?: None Help needed moving from lying on your back to sitting on the side of a flat bed without using bedrails?: None Help needed moving to and from a bed to a chair (including a wheelchair)?: None Help needed standing up from a chair using your arms (e.g., wheelchair or bedside chair)?: A Little Help needed to walk in hospital room?: A Little Help needed climbing 3-5 steps with a railing? : A Lot 6 Click Score: 20    End of Session   Activity Tolerance: Patient tolerated treatment well Patient left: in chair;with call bell/phone within reach Nurse Communication: Mobility status PT Visit Diagnosis: Muscle weakness (generalized) (M62.81);Difficulty in walking, not elsewhere classified (R26.2)    Time: 0254-2706 PT Time Calculation (min) (ACUTE ONLY): 17 min   Charges:   PT Evaluation $PT Eval Low Complexity: 1 Low          Blanchard Kelch PT Acute Rehabilitation Services Pager [phone] Office [phone]   Rada Hay 05/29/2019, 1:18 PM

## 2019-05-29 NOTE — Progress Notes (Signed)
PROGRESS NOTE                                                                                                                                                                                                             Patient Demographics:    Alejandro Hicks, is a 85 y.o. male, DOB - 1934/03/09, NAT:[id number]  Outpatient Primary MD for the patient is Wenda Low, MD    LOS - 0  Admit date - 05/28/2019    CC- cough, shortness of breath     Brief Narrative - 85 year old Caucasian male with history of hypertension, asthma, dyslipidemia who went to [facility] with family and friends on a vacation and came back with some cough and shortness of breath which started around 3 days ago, he was then checked for COVID-19 and was +3 days ago.  He developed mild nausea vomiting and diarrhea but subsequently developed some exertional shortness of breath and was then admitted to the hospital.   Subjective:   Patient in bed, appears comfortable, denies any headache, no fever, no chest pain or pressure, no shortness of breath , no abdominal pain. No focal weakness.   Assessment  & Plan :     1.  Acute Covid 19 Viral Pneumonitis during the ongoing 2020 Covid 19 Pandemic - he is of exertional shortness of breath along with positive infiltrate with a positive COVID-19 infection, he will be placed on low-dose steroid along with IV Remdisvir.  Monitor inflammatory markers closely.  Monitor clinically.  Encouraged to sit up in chair use I-S and flutter valve for pulmonary toiletry and prone in bed when at night.   COVID-19 Labs  Recent Labs    05/27/19 2040 05/27/19 2354 05/28/19 0600 05/29/19 0205  DDIMER  --   --  0.56* 0.51*  FERRITIN 101  --   --  113  LDH  --  238*  --  313*  CRP 7.3*  --  6.5* 4.3*    No results found for: SARSCOV2NAA   Hepatic Function Latest Ref Rng & Units 05/29/2019 05/28/2019 05/27/2019  Total Protein 6.5  - 8.1 g/dL 6.2(L) 6.3(L) 6.8  Albumin 3.5 - 5.0 g/dL 3.2(L) 3.4(L) 3.5  AST 15 - 41 U/L 81(H) 76(H) 60(H)  ALT 0 - 44 U/L 62(H) 56(H) 45(H)  Alk Phosphatase 38 - 126 U/L 57  58 63  Total Bilirubin 0.3 - 1.2 mg/dL 0.3 0.5 0.6        Component Value Date/Time   BNP 50.5 05/29/2019 0205      2.  GERD.  Placed on PPI.  3.  Hypertension.  For now he has been placed on Norvasc.  4.  Peripheral neuropathy.  Continue Neurontin.   5.  Mild COVID-19 related transaminitis.  Will monitor his symptoms 3.  He is also on Remdisvir.  We will monitor closely.   Condition - Fair  Family Communication  : Called daughter on the listed phone number on 05/29/2019 at 9:45 AM.  Code Status : Full  Diet :   Diet Order            Diet Heart Room service appropriate? Yes; Fluid consistency: Thin  Diet effective now               Disposition Plan  :  Inpt  Consults  :  None  Procedures  :   PUD Prophylaxis :    DVT Prophylaxis  :  Lovenox   Lab Results  Component Value Date   PLT 126 (L) 05/29/2019    Inpatient Medications  Scheduled Meds: . amLODipine  10 mg Oral Daily  . aspirin EC  81 mg Oral Daily  . cholecalciferol  1,000 Units Oral Daily  . dexamethasone  6 mg Oral Q24H  . enoxaparin (LOVENOX) injection  40 mg Subcutaneous Q24H  . gabapentin  300 mg Oral BID  . pantoprazole  40 mg Oral Daily  . vitamin C  500 mg Oral Daily  . zinc sulfate  220 mg Oral Daily   Continuous Infusions: . sodium chloride    . remdesivir 100 mg in NS 250 mL 100 mg (05/29/19 0933)   PRN Meds:.sodium chloride, acetaminophen, ALPRAZolam, chlorpheniramine-HYDROcodone, guaiFENesin-dextromethorphan, [DISCONTINUED] ondansetron **OR** ondansetron (ZOFRAN) IV, sodium chloride flush  Antibiotics  :    Anti-infectives (From admission, onward)   Start     Dose/Rate Route Frequency Ordered Stop   05/29/19 1000  remdesivir 100 mg in sodium chloride 0.9 % 250 mL IVPB     100 mg 500 mL/hr over 30  Minutes Intravenous Every 24 hours 05/28/19 0809 06/02/19 0959   05/28/19 0900  remdesivir 200 mg in sodium chloride 0.9 % 250 mL IVPB     200 mg 500 mL/hr over 30 Minutes Intravenous Once 05/28/19 0809 05/28/19 1210       Time Spent in minutes  30   Lala Lund M.D on 05/29/2019 at 9:45 AM  To page go to [email or web address] - password [facility]  [facility] -  Office  [phone]    See all Orders from today for further details    Objective:   Vitals:   05/29/19 0500 05/29/19 0600 05/29/19 0802 05/29/19 0847  BP:   137/73 (!) 142/80  Pulse: (!) 57 (!) 55 73   Resp:   (!) 21   Temp:   (!) 97.5 F (36.4 C)   TempSrc:   Oral   SpO2: 96% 95% 92%   Weight:      Height:        Wt Readings from Last 3 Encounters:  05/28/19 72.4 kg  05/27/19 72.6 kg  03/15/19 73.5 kg     Intake/Output Summary (Last 24 hours) at 05/29/2019 0945 Last data filed at 05/29/2019 0400 Gross per 24 hour  Intake 0 ml  Output 925 ml  Net -925 ml  Physical Exam  Awake Alert, Oriented X 3, No new F.N deficits, Normal affect [place].AT,PERRAL Supple Neck,No JVD, No cervical lymphadenopathy appriciated.  Symmetrical Chest wall movement, Good air movement bilaterally, CTAB RRR,No Gallops, Rubs or new Murmurs, No Parasternal Heave +ve B.Sounds, Abd Soft, No tenderness, No organomegaly appriciated, No rebound - guarding or rigidity. No Cyanosis, Clubbing or edema, No new Rash or bruise    Data Review:    CBC Recent Labs  Lab 05/27/19 2040 05/28/19 0600 05/29/19 0205  WBC 5.8 4.7 8.2  HGB 16.2 15.5 15.4  HCT 49.1 47.3 47.3  PLT 118* 114* 126*  MCV 86.3 88.4 87.9  MCH 28.5 29.0 28.6  MCHC 33.0 32.8 32.6  RDW 13.4 13.5 13.5  LYMPHSABS 0.9 0.4* 0.6*  MONOABS 0.4 0.1 0.6  EOSABS 0.0 0.0 0.0  BASOSABS 0.0 0.0 0.0    Chemistries  Recent Labs  Lab 05/27/19 2040 05/28/19 0600 05/29/19 0205  NA 135 138 139  K 3.6 5.0 4.7  CL 99 105 102  CO2 26 27 26   GLUCOSE 116* 142* 123*   BUN 23 24* 25*  CREATININE 1.04 1.05  1.06 1.03  CALCIUM 8.6* 8.5* 8.7*  MG  --  1.9 2.1  AST 60* 76* 81*  ALT 45* 56* 62*  ALKPHOS 63 58 57  BILITOT 0.6 0.5 0.3   ------------------------------------------------------------------------------------------------------------------ Recent Labs    05/27/19 2040  TRIG 127    No results found for: HGBA1C ------------------------------------------------------------------------------------------------------------------ No results for input(s): TSH, T4TOTAL, T3FREE, THYROIDAB in the last 72 hours.  Invalid input(s): FREET3  Cardiac Enzymes No results for input(s): CKMB, TROPONINI, MYOGLOBIN in the last 168 hours.  Invalid input(s): CK ------------------------------------------------------------------------------------------------------------------    Component Value Date/Time   BNP 50.5 05/29/2019 0205    Micro Results Recent Results (from the past 240 hour(s))  Blood Culture (routine x 2)     Status: None (Preliminary result)   Collection Time: 05/27/19 11:54 PM   Specimen: BLOOD  Result Value Ref Range Status   Specimen Description BLOOD RIGHT ANTECUBITAL  Final   Special Requests   Final    BOTTLES DRAWN AEROBIC AND ANAEROBIC Blood Culture adequate volume   Culture   Final    NO GROWTH 1 DAY Performed at [facility], [address], [place], [place] [address]    Report Status PENDING  Incomplete  Blood Culture (routine x 2)     Status: None (Preliminary result)   Collection Time: 05/27/19 11:54 PM   Specimen: BLOOD  Result Value Ref Range Status   Specimen Description BLOOD RIGHT HAND  Final   Special Requests   Final    BOTTLES DRAWN AEROBIC AND ANAEROBIC Blood Culture adequate volume   Culture   Final    NO GROWTH 1 DAY Performed at [facility], [address] [place], [place] [address]    Report Status PENDING  Incomplete    Radiology Reports Dg Chest 2 View  Result Date:  05/27/2019 CLINICAL DATA:  Cough, covid EXAM: CHEST - 2 VIEW COMPARISON:  October 04, 2017 FINDINGS: The heart size and mediastinal contours are within normal limits. There is mildly increased hazy airspace opacity seen within the periphery of the right upper lung and the left lower lung. Aortic knob calcifications are seen. No acute osseous abnormality. IMPRESSION: Hazy peripheral airspace opacity is within both lungs. The findings in the lungs are nonspecific, but concerning for atypical infection, which includes viral pneumonia. Electronically Signed   By: Prudencio Pair  M.D.   On: 05/27/2019 21:23

## 2019-05-29 NOTE — Plan of Care (Signed)
Voicemail left with daughter [name]. Patient made aware of it.

## 2019-05-29 NOTE — Plan of Care (Signed)
Spoke with Daughter to discuss patient's current POC and status. No further questions at this time.

## 2019-05-30 DIAGNOSIS — J1289 Other viral pneumonia: Secondary | ICD-10-CM | POA: Diagnosis not present

## 2019-05-30 DIAGNOSIS — U071 COVID-19: Secondary | ICD-10-CM | POA: Diagnosis not present

## 2019-05-30 LAB — CBC WITH DIFFERENTIAL/PLATELET
Abs Immature Granulocytes: 0.04 10*3/uL (ref 0.00–0.07)
Basophils Absolute: 0 10*3/uL (ref 0.0–0.1)
Basophils Relative: 0 %
Eosinophils Absolute: 0 10*3/uL (ref 0.0–0.5)
Eosinophils Relative: 0 %
HCT: 44.8 % (ref 39.0–52.0)
Hemoglobin: 14.5 g/dL (ref 13.0–17.0)
Immature Granulocytes: 1 %
Lymphocytes Relative: 7 %
Lymphs Abs: 0.6 10*3/uL — ABNORMAL LOW (ref 0.7–4.0)
MCH: 28.8 pg (ref 26.0–34.0)
MCHC: 32.4 g/dL (ref 30.0–36.0)
MCV: 89.1 fL (ref 80.0–100.0)
Monocytes Absolute: 0.7 10*3/uL (ref 0.1–1.0)
Monocytes Relative: 8 %
Neutro Abs: 7.5 10*3/uL (ref 1.7–7.7)
Neutrophils Relative %: 84 %
Platelets: 139 10*3/uL — ABNORMAL LOW (ref 150–400)
RBC: 5.03 MIL/uL (ref 4.22–5.81)
RDW: 13.6 % (ref 11.5–15.5)
WBC: 8.8 10*3/uL (ref 4.0–10.5)
nRBC: 0 % (ref 0.0–0.2)

## 2019-05-30 LAB — COMPREHENSIVE METABOLIC PANEL
ALT: 82 U/L — ABNORMAL HIGH (ref 0–44)
AST: 92 U/L — ABNORMAL HIGH (ref 15–41)
Albumin: 2.9 g/dL — ABNORMAL LOW (ref 3.5–5.0)
Alkaline Phosphatase: 57 U/L (ref 38–126)
Anion gap: 14 (ref 5–15)
BUN: 28 mg/dL — ABNORMAL HIGH (ref 8–23)
CO2: 22 mmol/L (ref 22–32)
Calcium: 8.3 mg/dL — ABNORMAL LOW (ref 8.9–10.3)
Chloride: 103 mmol/L (ref 98–111)
Creatinine, Ser: 0.95 mg/dL (ref 0.61–1.24)
GFR calc Af Amer: >60 mL/min (ref >60)
GFR calc non Af Amer: >60 mL/min (ref >60)
Glucose, Bld: 137 mg/dL — ABNORMAL HIGH (ref 70–99)
Potassium: 4.1 mmol/L (ref 3.5–5.1)
Sodium: 139 mmol/L (ref 135–145)
Total Bilirubin: 0.3 mg/dL (ref 0.3–1.2)
Total Protein: 5.6 g/dL — ABNORMAL LOW (ref 6.5–8.1)

## 2019-05-30 LAB — FERRITIN: Ferritin: 122 ng/mL (ref 24–336)

## 2019-05-30 LAB — D-DIMER, QUANTITATIVE: D-Dimer, Quant: 0.45 ug/mL-FEU (ref 0.00–0.50)

## 2019-05-30 LAB — LACTATE DEHYDROGENASE: LDH: 273 U/L — ABNORMAL HIGH (ref 98–192)

## 2019-05-30 LAB — MAGNESIUM: Magnesium: 1.9 mg/dL (ref 1.7–2.4)

## 2019-05-30 LAB — BRAIN NATRIURETIC PEPTIDE: B Natriuretic Peptide: 59.9 pg/mL (ref 0.0–100.0)

## 2019-05-30 LAB — C-REACTIVE PROTEIN: CRP: 2 mg/dL — ABNORMAL HIGH (ref <1.0)

## 2019-05-30 NOTE — Progress Notes (Signed)
PROGRESS NOTE                                                                                                                                                                                                             Patient Demographics:    Alejandro Hicks, is a 85 y.o. male, DOB - 09-Apr-1934, KNL:[id number]  Outpatient Primary MD for the patient is Wenda Low, MD    LOS - 1  Admit date - 05/28/2019    CC- cough, shortness of breath     Brief Narrative - 85 year old Caucasian male with history of hypertension, asthma, dyslipidemia who went to [facility] with family and friends on a vacation and came back with some cough and shortness of breath which started around 3 days ago, he was then checked for COVID-19 and was +3 days ago.  He developed mild nausea vomiting and diarrhea but subsequently developed some exertional shortness of breath and was then admitted to the hospital.   Subjective:   Patient in recliner, appears comfortable, denies any headache, no fever, no chest pain or pressure, no shortness of breath , no abdominal pain. No focal weakness.   Assessment  & Plan :     1.  Acute Covid 19 Viral Pneumonitis during the ongoing 2020 Covid 19 Pandemic - he is of exertional shortness of breath along with positive infiltrate with a positive COVID-19 infection, he will be placed on low-dose steroid along with IV Remdisvir.  Clinically he continues to improve with no fatigue or shortness of breath at rest, inflammatory markers improving as well.  Encouraged to sit up in chair use I-S and flutter valve for pulmonary toiletry and prone in bed when at night.   COVID-19 Labs  Recent Labs    05/27/19 2040 05/27/19 2354 05/28/19 0600 05/29/19 0205 05/30/19 0345  DDIMER  --   --  0.56* 0.51* 0.45  FERRITIN 101  --   --  113 122  LDH  --  238*  --  313* 273*  CRP 7.3*  --  6.5* 4.3* 2.0*    No results found for:  SARSCOV2NAA   Hepatic Function Latest Ref Rng & Units 05/30/2019 05/29/2019 05/28/2019  Total Protein 6.5 - 8.1 g/dL 5.6(L) 6.2(L) 6.3(L)  Albumin 3.5 - 5.0 g/dL 2.9(L) 3.2(L) 3.4(L)  AST 15 - 41 U/L 92(H) 81(H)  76(H)  ALT 0 - 44 U/L 82(H) 62(H) 56(H)  Alk Phosphatase 38 - 126 U/L 57 57 58  Total Bilirubin 0.3 - 1.2 mg/dL 0.3 0.3 0.5        Component Value Date/Time   BNP 59.9 05/30/2019 0345      2.  GERD.  Placed on PPI.  3.  Hypertension.  For now he has been placed on Norvasc.  4.  Peripheral neuropathy.  Continue Neurontin.   5.  Mild COVID-19 related transaminitis.  Will monitor trend, he is symptom-free.  Likely due to combination of COVID-19 transaminitis and use of Remdisvir.  Will monitor closely.     Condition - Fair  Family Communication  : Called daughter on the listed phone number on 05/29/2019 at 9:45 AM.  Code Status : Full  Diet :   Diet Order            Diet Heart Room service appropriate? Yes; Fluid consistency: Thin  Diet effective now               Disposition Plan  :  Home 06/02/19 after finishing his Remdisvir course.  Consults  :  None  Procedures  :   PUD Prophylaxis :  PPI  DVT Prophylaxis  :  Lovenox   Lab Results  Component Value Date   PLT 139 (L) 05/30/2019    Inpatient Medications  Scheduled Meds: . amLODipine  10 mg Oral Daily  . aspirin EC  81 mg Oral Daily  . cholecalciferol  1,000 Units Oral Daily  . dexamethasone  6 mg Oral Q24H  . enoxaparin (LOVENOX) injection  40 mg Subcutaneous Q24H  . gabapentin  300 mg Oral BID  . pantoprazole  40 mg Oral Daily  . vitamin C  500 mg Oral Daily  . zinc sulfate  220 mg Oral Daily   Continuous Infusions: . sodium chloride    . remdesivir 100 mg in NS 250 mL 100 mg (05/30/19 0907)   PRN Meds:.sodium chloride, acetaminophen, ALPRAZolam, chlorpheniramine-HYDROcodone, guaiFENesin-dextromethorphan, [DISCONTINUED] ondansetron **OR** ondansetron (ZOFRAN) IV, sodium chloride  flush  Antibiotics  :    Anti-infectives (From admission, onward)   Start     Dose/Rate Route Frequency Ordered Stop   05/29/19 1000  remdesivir 100 mg in sodium chloride 0.9 % 250 mL IVPB     100 mg 500 mL/hr over 30 Minutes Intravenous Every 24 hours 05/28/19 0809 06/02/19 0959   05/28/19 0900  remdesivir 200 mg in sodium chloride 0.9 % 250 mL IVPB     200 mg 500 mL/hr over 30 Minutes Intravenous Once 05/28/19 0809 05/28/19 1210       Time Spent in minutes  30   Lala Lund M.D on 05/30/2019 at 9:41 AM  To page go to [email or web address] - password [email or web address]  [facility] -  Office  [phone]    See all Orders from today for further details    Objective:   Vitals:   05/29/19 1600 05/29/19 2018 05/30/19 0441 05/30/19 0800  BP: (!) 136/58 (!) 148/71 (!) 148/79 131/76  Pulse: 70 70 65   Resp: (!) 21  20 20   Temp: 98.2 F (36.8 C) 97.8 F (36.6 C) 97.9 F (36.6 C) 97.8 F (36.6 C)  TempSrc: Oral Oral Oral Oral  SpO2: 94%  90% 91%  Weight:      Height:        Wt Readings from Last 3 Encounters:  05/28/19 72.4 kg  05/27/19 72.6 kg  03/15/19  73.5 kg     Intake/Output Summary (Last 24 hours) at 05/30/2019 0941 Last data filed at 05/30/2019 0654 Gross per 24 hour  Intake 610 ml  Output 1300 ml  Net -690 ml     Physical Exam  Awake Alert, Oriented X 3, No new F.N deficits, Normal affect [place].AT,PERRAL Supple Neck,No JVD, No cervical lymphadenopathy appriciated.  Symmetrical Chest wall movement, Good air movement bilaterally, CTAB RRR,No Gallops, Rubs or new Murmurs, No Parasternal Heave +ve B.Sounds, Abd Soft, No tenderness, No organomegaly appriciated, No rebound - guarding or rigidity. No Cyanosis, Clubbing or edema, No new Rash or bruise    Data Review:    CBC Recent Labs  Lab 05/27/19 2040 05/28/19 0600 05/29/19 0205 05/30/19 0345  WBC 5.8 4.7 8.2 8.8  HGB 16.2 15.5 15.4 14.5  HCT 49.1 47.3 47.3 44.8  PLT 118* 114* 126* 139*  MCV 86.3 88.4  87.9 89.1  MCH 28.5 29.0 28.6 28.8  MCHC 33.0 32.8 32.6 32.4  RDW 13.4 13.5 13.5 13.6  LYMPHSABS 0.9 0.4* 0.6* 0.6*  MONOABS 0.4 0.1 0.6 0.7  EOSABS 0.0 0.0 0.0 0.0  BASOSABS 0.0 0.0 0.0 0.0    Chemistries  Recent Labs  Lab 05/27/19 2040 05/28/19 0600 05/29/19 0205 05/30/19 0345  NA 135 138 139 139  K 3.6 5.0 4.7 4.1  CL 99 105 102 103  CO2 26 27 26 22   GLUCOSE 116* 142* 123* 137*  BUN 23 24* 25* 28*  CREATININE 1.04 1.05  1.06 1.03 0.95  CALCIUM 8.6* 8.5* 8.7* 8.3*  MG  --  1.9 2.1 1.9  AST 60* 76* 81* 92*  ALT 45* 56* 62* 82*  ALKPHOS 63 58 57 57  BILITOT 0.6 0.5 0.3 0.3   ------------------------------------------------------------------------------------------------------------------ Recent Labs    05/27/19 2040  TRIG 127    No results found for: HGBA1C ------------------------------------------------------------------------------------------------------------------ No results for input(s): TSH, T4TOTAL, T3FREE, THYROIDAB in the last 72 hours.  Invalid input(s): FREET3  Cardiac Enzymes No results for input(s): CKMB, TROPONINI, MYOGLOBIN in the last 168 hours.  Invalid input(s): CK ------------------------------------------------------------------------------------------------------------------    Component Value Date/Time   BNP 59.9 05/30/2019 0345    Micro Results Recent Results (from the past 240 hour(s))  Blood Culture (routine x 2)     Status: None (Preliminary result)   Collection Time: 05/27/19 11:54 PM   Specimen: BLOOD  Result Value Ref Range Status   Specimen Description BLOOD RIGHT ANTECUBITAL  Final   Special Requests   Final    BOTTLES DRAWN AEROBIC AND ANAEROBIC Blood Culture adequate volume   Culture   Final    NO GROWTH 2 DAYS Performed at [facility], [address] [place], [place] [address]    Report Status PENDING  Incomplete  Blood Culture (routine x 2)     Status: None (Preliminary result)   Collection Time:  05/27/19 11:54 PM   Specimen: BLOOD  Result Value Ref Range Status   Specimen Description BLOOD RIGHT HAND  Final   Special Requests   Final    BOTTLES DRAWN AEROBIC AND ANAEROBIC Blood Culture adequate volume   Culture   Final    NO GROWTH 2 DAYS Performed at [facility], [address] [place], [place] [address]    Report Status PENDING  Incomplete    Radiology Reports Dg Chest 2 View  Result Date: 05/27/2019 CLINICAL DATA:  Cough, covid EXAM: CHEST - 2 VIEW COMPARISON:  October 04, 2017 FINDINGS: The heart size and  mediastinal contours are within normal limits. There is mildly increased hazy airspace opacity seen within the periphery of the right upper lung and the left lower lung. Aortic knob calcifications are seen. No acute osseous abnormality. IMPRESSION: Hazy peripheral airspace opacity is within both lungs. The findings in the lungs are nonspecific, but concerning for atypical infection, which includes viral pneumonia. Electronically Signed   By: Prudencio Pair M.D.   On: 05/27/2019 21:23

## 2019-05-30 NOTE — Progress Notes (Signed)
Patients daughter, [name] updated on patient status. Questions encouraged and answered.

## 2019-05-31 DIAGNOSIS — I10 Essential (primary) hypertension: Secondary | ICD-10-CM

## 2019-05-31 DIAGNOSIS — J1289 Other viral pneumonia: Secondary | ICD-10-CM | POA: Diagnosis not present

## 2019-05-31 DIAGNOSIS — U071 COVID-19: Secondary | ICD-10-CM | POA: Diagnosis not present

## 2019-05-31 LAB — CBC WITH DIFFERENTIAL/PLATELET
Abs Immature Granulocytes: 0.08 10*3/uL — ABNORMAL HIGH (ref 0.00–0.07)
Basophils Absolute: 0 10*3/uL (ref 0.0–0.1)
Basophils Relative: 0 %
Eosinophils Absolute: 0 10*3/uL (ref 0.0–0.5)
Eosinophils Relative: 0 %
HCT: 48.2 % (ref 39.0–52.0)
Hemoglobin: 15.5 g/dL (ref 13.0–17.0)
Immature Granulocytes: 1 %
Lymphocytes Relative: 8 %
Lymphs Abs: 0.7 10*3/uL (ref 0.7–4.0)
MCH: 28.4 pg (ref 26.0–34.0)
MCHC: 32.2 g/dL (ref 30.0–36.0)
MCV: 88.4 fL (ref 80.0–100.0)
Monocytes Absolute: 0.9 10*3/uL (ref 0.1–1.0)
Monocytes Relative: 10 %
Neutro Abs: 7 10*3/uL (ref 1.7–7.7)
Neutrophils Relative %: 81 %
Platelets: 143 10*3/uL — ABNORMAL LOW (ref 150–400)
RBC: 5.45 MIL/uL (ref 4.22–5.81)
RDW: 13.6 % (ref 11.5–15.5)
WBC: 8.7 10*3/uL (ref 4.0–10.5)
nRBC: 0 % (ref 0.0–0.2)

## 2019-05-31 LAB — LACTATE DEHYDROGENASE: LDH: 320 U/L — ABNORMAL HIGH (ref 98–192)

## 2019-05-31 LAB — COMPREHENSIVE METABOLIC PANEL
ALT: 85 U/L — ABNORMAL HIGH (ref 0–44)
AST: 79 U/L — ABNORMAL HIGH (ref 15–41)
Albumin: 2.9 g/dL — ABNORMAL LOW (ref 3.5–5.0)
Alkaline Phosphatase: 63 U/L (ref 38–126)
Anion gap: 9 (ref 5–15)
BUN: 29 mg/dL — ABNORMAL HIGH (ref 8–23)
CO2: 25 mmol/L (ref 22–32)
Calcium: 8.3 mg/dL — ABNORMAL LOW (ref 8.9–10.3)
Chloride: 103 mmol/L (ref 98–111)
Creatinine, Ser: 0.92 mg/dL (ref 0.61–1.24)
GFR calc Af Amer: >60 mL/min (ref >60)
GFR calc non Af Amer: >60 mL/min (ref >60)
Glucose, Bld: 118 mg/dL — ABNORMAL HIGH (ref 70–99)
Potassium: 4.3 mmol/L (ref 3.5–5.1)
Sodium: 137 mmol/L (ref 135–145)
Total Bilirubin: 0.2 mg/dL — ABNORMAL LOW (ref 0.3–1.2)
Total Protein: 5.6 g/dL — ABNORMAL LOW (ref 6.5–8.1)

## 2019-05-31 LAB — D-DIMER, QUANTITATIVE: D-Dimer, Quant: 0.64 ug/mL-FEU — ABNORMAL HIGH (ref 0.00–0.50)

## 2019-05-31 LAB — FERRITIN: Ferritin: 92 ng/mL (ref 24–336)

## 2019-05-31 LAB — BRAIN NATRIURETIC PEPTIDE: B Natriuretic Peptide: 226.1 pg/mL — ABNORMAL HIGH (ref 0.0–100.0)

## 2019-05-31 LAB — C-REACTIVE PROTEIN: CRP: 1.1 mg/dL — ABNORMAL HIGH (ref <1.0)

## 2019-05-31 LAB — MAGNESIUM: Magnesium: 1.9 mg/dL (ref 1.7–2.4)

## 2019-05-31 MED ORDER — BENZONATATE 100 MG PO CAPS
200.0000 mg | ORAL_CAPSULE | Freq: Three times a day (TID) | ORAL | Status: DC
  Administered 2019-05-31 – 2019-06-06 (×17): 200 mg via ORAL
  Filled 2019-05-31 (×19): qty 2

## 2019-05-31 NOTE — Progress Notes (Signed)
Pt A&Ox4, denies pain, no acute distress, has non-productive cough, on R/A with O2 Sats=93%. Updated daughter [name] on pt status and current POC.

## 2019-05-31 NOTE — Progress Notes (Signed)
PROGRESS NOTE                                                                                                                                                                                                             Patient Demographics:    Alejandro Hicks, is a 85 y.o. male, DOB - 1933-12-17, ELF:[id number]  Outpatient Primary MD for the patient is Wenda Low, MD   Admit date - 05/28/2019   LOS - 2  No chief complaint on file.      Brief Narrative: Patient is a 85 y.o. male with PMHx of HTN, dyslipidemia, asthma who went to [facility] with family and friends-and subsequently came back with cough and shortness of breath-subsequently found to have COVID-19-he developed acute hypoxemic respiratory failure secondary to pneumonia.  He was then admitted to the hospitalist service for further treatment and evaluation-see below for further details.   Subjective:    Alejandro Hicks today    Assessment  & Plan :   Acute Hypoxic Resp Failure due to Covid 19 Viral pneumonia: Improved-on room air-continue steroids and Remdesivir.  Fever: afebrile  O2 requirements: On room air  COVID-19 Labs: Recent Labs    05/29/19 0205 05/30/19 0345 05/31/19 0340  DDIMER 0.51* 0.45 0.64*  FERRITIN 113 122 92  LDH 313* 273* 320*  CRP 4.3* 2.0* 1.1*    No results found for: SARSCOV2NAA   COVID-19 Medications: Steroids: 10/4>> Remdesivir:10/4>> Actemra: Not indicated Convalescent Plasma:Not indicated Research Studies:N/A  Other medications: Diuretics:Euvolemic-no need for lasix Antibiotics:Not needed as no evidence of bacterial infection  Prone/Incentive Spirometry: encouraged patient to lie prone for 3-4 hours at a time for a total of 16 hours a day, and to encourage incentive spirometry use 3-4/hour.  DVT Prophylaxis  :  Lovenox  Transaminitis: Secondary to COVID-19-improving-follow periodically  Hypertension:  Controlled-continue amlodipine  GERD: Continue PPI  Peripheral neuropathy: Continue Neurontin   ABG: No results found for: PHART, PCO2ART, PO2ART, HCO3, TCO2, ACIDBASEDEF, O2SAT  Vent Settings: N/A   Condition - Stable  Family Communication  : Daughter updated over the phone  Code Status :  Full Code Diet :  Diet Order            Diet Heart Room service appropriate? Yes; Fluid consistency: Thin  Diet effective now  Disposition Plan  :  Remain hospitalized-tentative discharge  to Home on 10/8  Barriers to discharge: Need to complete Remdesivir x5 days  Consults  :  Non  Procedures  :  None  Antibiotics  :    Anti-infectives (From admission, onward)   Start     Dose/Rate Route Frequency Ordered Stop   05/29/19 1000  remdesivir 100 mg in sodium chloride 0.9 % 250 mL IVPB     100 mg 500 mL/hr over 30 Minutes Intravenous Every 24 hours 05/28/19 0809 06/02/19 0959   05/28/19 0900  remdesivir 200 mg in sodium chloride 0.9 % 250 mL IVPB     200 mg 500 mL/hr over 30 Minutes Intravenous Once 05/28/19 0809 05/28/19 1210      Inpatient Medications  Scheduled Meds: . amLODipine  10 mg Oral Daily  . aspirin EC  81 mg Oral Daily  . benzonatate  200 mg Oral TID  . cholecalciferol  1,000 Units Oral Daily  . dexamethasone  6 mg Oral Q24H  . enoxaparin (LOVENOX) injection  40 mg Subcutaneous Q24H  . pantoprazole  40 mg Oral Daily  . vitamin C  500 mg Oral Daily  . zinc sulfate  220 mg Oral Daily   Continuous Infusions: . sodium chloride Stopped (05/31/19 0947)  . remdesivir 100 mg in NS 250 mL 500 mL/hr at 05/31/19 1000   PRN Meds:.sodium chloride, acetaminophen, ALPRAZolam, chlorpheniramine-HYDROcodone, guaiFENesin-dextromethorphan, [DISCONTINUED] ondansetron **OR** ondansetron (ZOFRAN) IV, sodium chloride flush   Time Spent in minutes  25   See all Orders from today for further details   Jeoffrey Massed M.D on 05/31/2019 at 1:18 PM  To page go to  [email or web address] - use universal password  [facility] -  Office  [phone]    Objective:   Vitals:   05/30/19 1611 05/30/19 2000 05/31/19 0424 05/31/19 0752  BP: 113/71 (!) 133/94 117/90 112/76  Pulse: 82   78  Resp: 19 20 18 18   Temp: 98.1 F (36.7 C) 97.9 F (36.6 C) 98.1 F (36.7 C) 97.8 F (36.6 C)  TempSrc: Oral Oral Oral Oral  SpO2: 94%   94%  Weight:      Height:        Wt Readings from Last 3 Encounters:  05/28/19 72.4 kg  05/27/19 72.6 kg  03/15/19 73.5 kg     Intake/Output Summary (Last 24 hours) at 05/31/2019 1318 Last data filed at 05/31/2019 1000 Gross per 24 hour  Intake 1059.96 ml  Output 825 ml  Net 234.96 ml     Physical Exam Gen Exam:Alert awake-not in any distress HEENT:atraumatic, normocephalic Chest: B/L clear to auscultation anteriorly CVS:S1S2 regular Abdomen:soft non tender, non distended Extremities:no edema Neurology: Non focal Skin: no rash   Data Review:    CBC Recent Labs  Lab 05/27/19 2040 05/28/19 0600 05/29/19 0205 05/30/19 0345 05/31/19 0340  WBC 5.8 4.7 8.2 8.8 8.7  HGB 16.2 15.5 15.4 14.5 15.5  HCT 49.1 47.3 47.3 44.8 48.2  PLT 118* 114* 126* 139* 143*  MCV 86.3 88.4 87.9 89.1 88.4  MCH 28.5 29.0 28.6 28.8 28.4  MCHC 33.0 32.8 32.6 32.4 32.2  RDW 13.4 13.5 13.5 13.6 13.6  LYMPHSABS 0.9 0.4* 0.6* 0.6* 0.7  MONOABS 0.4 0.1 0.6 0.7 0.9  EOSABS 0.0 0.0 0.0 0.0 0.0  BASOSABS 0.0 0.0 0.0 0.0 0.0    Chemistries  Recent Labs  Lab 05/27/19 2040 05/28/19 0600 05/29/19 0205 05/30/19 0345 05/31/19 0340  NA 135 138 139 139  137  K 3.6 5.0 4.7 4.1 4.3  CL 99 105 102 103 103  CO2 26 27 26 22 25   GLUCOSE 116* 142* 123* 137* 118*  BUN 23 24* 25* 28* 29*  CREATININE 1.04 1.05  1.06 1.03 0.95 0.92  CALCIUM 8.6* 8.5* 8.7* 8.3* 8.3*  MG  --  1.9 2.1 1.9 1.9  AST 60* 76* 81* 92* 79*  ALT 45* 56* 62* 82* 85*  ALKPHOS 63 58 57 57 63  BILITOT 0.6 0.5 0.3 0.3 0.2*    ------------------------------------------------------------------------------------------------------------------ No results for input(s): CHOL, HDL, LDLCALC, TRIG, CHOLHDL, LDLDIRECT in the last 72 hours.  No results found for: HGBA1C ------------------------------------------------------------------------------------------------------------------ No results for input(s): TSH, T4TOTAL, T3FREE, THYROIDAB in the last 72 hours.  Invalid input(s): FREET3 ------------------------------------------------------------------------------------------------------------------ Recent Labs    05/30/19 0345 05/31/19 0340  FERRITIN 122 92    Coagulation profile No results for input(s): INR, PROTIME in the last 168 hours.  Recent Labs    05/30/19 0345 05/31/19 0340  DDIMER 0.45 0.64*    Cardiac Enzymes No results for input(s): CKMB, TROPONINI, MYOGLOBIN in the last 168 hours.  Invalid input(s): CK ------------------------------------------------------------------------------------------------------------------    Component Value Date/Time   BNP 226.1 (H) 05/31/2019 0340    Micro Results Recent Results (from the past 240 hour(s))  Blood Culture (routine x 2)     Status: None (Preliminary result)   Collection Time: 05/27/19 11:54 PM   Specimen: BLOOD  Result Value Ref Range Status   Specimen Description BLOOD RIGHT ANTECUBITAL  Final   Special Requests   Final    BOTTLES DRAWN AEROBIC AND ANAEROBIC Blood Culture adequate volume   Culture   Final    NO GROWTH 3 DAYS Performed at [facility], [address] [place], [place] [address]    Report Status PENDING  Incomplete  Blood Culture (routine x 2)     Status: None (Preliminary result)   Collection Time: 05/27/19 11:54 PM   Specimen: BLOOD  Result Value Ref Range Status   Specimen Description BLOOD RIGHT HAND  Final   Special Requests   Final    BOTTLES DRAWN AEROBIC AND ANAEROBIC Blood Culture adequate volume    Culture   Final    NO GROWTH 3 DAYS Performed at [facility], [address] [place], [place] [address]    Report Status PENDING  Incomplete    Radiology Reports Dg Chest 2 View  Result Date: 05/27/2019 CLINICAL DATA:  Cough, covid EXAM: CHEST - 2 VIEW COMPARISON:  October 04, 2017 FINDINGS: The heart size and mediastinal contours are within normal limits. There is mildly increased hazy airspace opacity seen within the periphery of the right upper lung and the left lower lung. Aortic knob calcifications are seen. No acute osseous abnormality. IMPRESSION: Hazy peripheral airspace opacity is within both lungs. The findings in the lungs are nonspecific, but concerning for atypical infection, which includes viral pneumonia. Electronically Signed   By: Jonna ClarkBindu  Avutu M.D.   On: 05/27/2019 21:23

## 2019-05-31 NOTE — Progress Notes (Signed)
Physical Therapy Treatment Patient Details Name: Alejandro Hicks MRN: [id number] DOB: 17-May-1934 Today's Date: 05/31/2019    History of Present Illness 85 year old Caucasian male with history of hypertension, asthma, dyslipidemia who went to [facility] with family and friends on a vacation and came back with some cough and shortness of breath which started around 3 days ago, he was then checked for COVID-19 and was +3 days ago.  He developed mild nausea vomiting and diarrhea but subsequently developed some exertional shortness of breath and was then admitted to the hospital    PT Comments    The patient is mobilizing well, ambulated on  RA x 400'. SPO2 87% with quick return to 94%. May ambulate on O2 next visit  And monitor closely when practicing steps. Pt's bed and bath are on second level.   Follow Up Recommendations  No PT follow up     Equipment Recommendations  None recommended by PT    Recommendations for Other Services       Precautions / Restrictions Precautions Precaution Comments: monitor sats    Mobility  Bed Mobility Overal bed mobility: Independent                Transfers Overall transfer level: Independent                  Ambulation/Gait Ambulation/Gait assistance: Supervision Gait Distance (Feet): 400 Feet Assistive device: None Gait Pattern/deviations: Step-through pattern   Gait velocity interpretation: 1.31 - 2.62 ft/sec, indicative of limited community ambulator General Gait Details: SPO2 87% on RA, quickly returned to 93%.   Stairs             Wheelchair Mobility    Modified Rankin (Stroke Patients Only)       Balance Overall balance assessment: No apparent balance deficits (not formally assessed)                                          Cognition Arousal/Alertness: Awake/alert                                            Exercises      General Comments        Pertinent  Vitals/Pain      Home Living                      Prior Function            PT Goals (current goals can now be found in the care plan section) Progress towards PT goals: Progressing toward goals    Frequency    Min 3X/week      PT Plan Current plan remains appropriate    Co-evaluation              AM-PAC PT "6 Clicks" Mobility   Outcome Measure  Help needed turning from your back to your side while in a flat bed without using bedrails?: None Help needed moving from lying on your back to sitting on the side of a flat bed without using bedrails?: None Help needed moving to and from a bed to a chair (including a wheelchair)?: None Help needed standing up from a chair using your arms (e.g., wheelchair or bedside chair)?: None Help needed to  walk in hospital room?: None Help needed climbing 3-5 steps with a railing? : A Little 6 Click Score: 23    End of Session   Activity Tolerance: Patient tolerated treatment well Patient left: in chair;with call bell/phone within reach Nurse Communication: Mobility status       Time: 5830-9407 PT Time Calculation (min) (ACUTE ONLY): 10 min  Charges:  $Gait Training: 8-22 mins                     Tresa Endo PT Acute Rehabilitation Services Pager [phone] Office [phone]    Claretha Cooper 05/31/2019, 1:15 PM

## 2019-06-01 DIAGNOSIS — I10 Essential (primary) hypertension: Secondary | ICD-10-CM | POA: Diagnosis not present

## 2019-06-01 DIAGNOSIS — U071 COVID-19: Secondary | ICD-10-CM | POA: Diagnosis not present

## 2019-06-01 DIAGNOSIS — J1289 Other viral pneumonia: Secondary | ICD-10-CM | POA: Diagnosis not present

## 2019-06-01 LAB — CBC WITH DIFFERENTIAL/PLATELET
Abs Immature Granulocytes: 0.16 10*3/uL — ABNORMAL HIGH (ref 0.00–0.07)
Basophils Absolute: 0 10*3/uL (ref 0.0–0.1)
Basophils Relative: 0 %
Eosinophils Absolute: 0 10*3/uL (ref 0.0–0.5)
Eosinophils Relative: 0 %
HCT: 47.6 % (ref 39.0–52.0)
Hemoglobin: 15.4 g/dL (ref 13.0–17.0)
Immature Granulocytes: 2 %
Lymphocytes Relative: 9 %
Lymphs Abs: 0.8 10*3/uL (ref 0.7–4.0)
MCH: 28.4 pg (ref 26.0–34.0)
MCHC: 32.4 g/dL (ref 30.0–36.0)
MCV: 87.8 fL (ref 80.0–100.0)
Monocytes Absolute: 0.7 10*3/uL (ref 0.1–1.0)
Monocytes Relative: 8 %
Neutro Abs: 6.8 10*3/uL (ref 1.7–7.7)
Neutrophils Relative %: 81 %
Platelets: 154 10*3/uL (ref 150–400)
RBC: 5.42 MIL/uL (ref 4.22–5.81)
RDW: 13.6 % (ref 11.5–15.5)
WBC: 8.4 10*3/uL (ref 4.0–10.5)
nRBC: 0 % (ref 0.0–0.2)

## 2019-06-01 LAB — COMPREHENSIVE METABOLIC PANEL
ALT: 99 U/L — ABNORMAL HIGH (ref 0–44)
AST: 76 U/L — ABNORMAL HIGH (ref 15–41)
Albumin: 3 g/dL — ABNORMAL LOW (ref 3.5–5.0)
Alkaline Phosphatase: 67 U/L (ref 38–126)
Anion gap: 10 (ref 5–15)
BUN: 31 mg/dL — ABNORMAL HIGH (ref 8–23)
CO2: 24 mmol/L (ref 22–32)
Calcium: 8.3 mg/dL — ABNORMAL LOW (ref 8.9–10.3)
Chloride: 103 mmol/L (ref 98–111)
Creatinine, Ser: 0.92 mg/dL (ref 0.61–1.24)
GFR calc Af Amer: >60 mL/min (ref >60)
GFR calc non Af Amer: >60 mL/min (ref >60)
Glucose, Bld: 116 mg/dL — ABNORMAL HIGH (ref 70–99)
Potassium: 4.2 mmol/L (ref 3.5–5.1)
Sodium: 137 mmol/L (ref 135–145)
Total Bilirubin: 0.5 mg/dL (ref 0.3–1.2)
Total Protein: 5.6 g/dL — ABNORMAL LOW (ref 6.5–8.1)

## 2019-06-01 LAB — D-DIMER, QUANTITATIVE: D-Dimer, Quant: 0.8 ug/mL-FEU — ABNORMAL HIGH (ref 0.00–0.50)

## 2019-06-01 LAB — BRAIN NATRIURETIC PEPTIDE: B Natriuretic Peptide: 119.4 pg/mL — ABNORMAL HIGH (ref 0.0–100.0)

## 2019-06-01 LAB — FERRITIN: Ferritin: 82 ng/mL (ref 24–336)

## 2019-06-01 LAB — C-REACTIVE PROTEIN: CRP: 0.9 mg/dL (ref <1.0)

## 2019-06-01 LAB — LACTATE DEHYDROGENASE: LDH: 371 U/L — ABNORMAL HIGH (ref 98–192)

## 2019-06-01 LAB — MAGNESIUM: Magnesium: 1.9 mg/dL (ref 1.7–2.4)

## 2019-06-01 MED ORDER — FUROSEMIDE 10 MG/ML IJ SOLN
40.0000 mg | Freq: Once | INTRAMUSCULAR | Status: AC
  Administered 2019-06-01: 13:00:00 40 mg via INTRAVENOUS
  Filled 2019-06-01: qty 4

## 2019-06-01 MED ORDER — ALBUTEROL SULFATE HFA 108 (90 BASE) MCG/ACT IN AERS
2.0000 | INHALATION_SPRAY | Freq: Four times a day (QID) | RESPIRATORY_TRACT | Status: DC
  Administered 2019-06-01 – 2019-06-06 (×22): 2 via RESPIRATORY_TRACT
  Filled 2019-06-01: qty 6.7

## 2019-06-01 MED ORDER — HYDROCOD POLST-CPM POLST ER 10-8 MG/5ML PO SUER
5.0000 mL | Freq: Two times a day (BID) | ORAL | Status: DC
  Administered 2019-06-01 – 2019-06-06 (×10): 5 mL via ORAL
  Filled 2019-06-01 (×10): qty 5

## 2019-06-01 NOTE — Progress Notes (Signed)
PROGRESS NOTE                                                                                                                                                                                                             Patient Demographics:    Alejandro Hicks, is a 85 y.o. male, DOB - 1933-12-26, HBZ:[id number]  Outpatient Primary MD for the patient is Wenda Low, MD   Admit date - 05/28/2019   LOS - 3  No chief complaint on file.      Brief Narrative: Patient is a 85 y.o. male with PMHx of HTN, dyslipidemia, asthma who went to [facility] with family and friends-and subsequently came back with cough and shortness of breath-subsequently found to have COVID-19-he developed acute hypoxemic respiratory failure secondary to pneumonia.  He was then admitted to the hospitalist service for further treatment and evaluation-see below for further details.   Subjective:    Alejandro Hicks today continues to cough-he claims that with minimal ambulation he gets coughing spells.  He is concerned that his "asthma" may be acting up.   Assessment  & Plan :   Acute Hypoxic Resp Failure due to Covid 19 Viral pneumonia: Overall improved-stable on very minimal oxygen support this morning.  Lungs are clear.  Continue steroids and Remdesivir.  His main issue today seems to be paroxysmal coughing spells-change Tussionex to scheduled dosing-encourage incentive spirometer use-and start scheduled albuterol MDI.  Fever: afebrile  O2 requirements: 1-2 L of oxygen this morning.  COVID-19 Labs: Recent Labs    05/30/19 0345 05/31/19 0340 06/01/19 0135  DDIMER 0.45 0.64* 0.80*  FERRITIN 122 92 82  LDH 273* 320* 371*  CRP 2.0* 1.1* 0.9    No results found for: SARSCOV2NAA   COVID-19 Medications: Steroids: 10/4>> Remdesivir:10/4>> Actemra: Not indicated Convalescent Plasma:Not indicated Research Studies:N/A  Other  medications: Diuretics:Euvolemic-we will try 1 dose of IV Lasix today-to maintain negative balance. Antibiotics:Not needed as no evidence of bacterial infection  Prone/Incentive Spirometry: encouraged patient to lie prone for 3-4 hours at a time for a total of 16 hours a day, and to encourage incentive spirometry use 3-4/hour.  DVT Prophylaxis  :  Lovenox  Transaminitis: Secondary to COVID-19-improving-follow periodically.  Hypertension: Controlled-continue amlodipine  GERD: Continue PPI  Peripheral neuropathy: Continue Neurontin   ABG: No results found for: PHART, PCO2ART, PO2ART,  HCO3, TCO2, ACIDBASEDEF, O2SAT  Vent Settings: N/A   Condition - Stable  Family Communication  : Daughter updated over the phone on 10/8  Code Status :  Full Code Diet :  Diet Order            Diet Heart Room service appropriate? Yes; Fluid consistency: Thin  Diet effective now               Disposition Plan  :  Remain hospitalized-tentative discharge  to Home on 10/9  Barriers to discharge: Need to complete Remdesivir x5 days  Consults  :  Non  Procedures  :  None  Antibiotics  :    Anti-infectives (From admission, onward)   Start     Dose/Rate Route Frequency Ordered Stop   05/29/19 1000  remdesivir 100 mg in sodium chloride 0.9 % 250 mL IVPB     100 mg 500 mL/hr over 30 Minutes Intravenous Every 24 hours 05/28/19 0809 06/01/19 0928   05/28/19 0900  remdesivir 200 mg in sodium chloride 0.9 % 250 mL IVPB     200 mg 500 mL/hr over 30 Minutes Intravenous Once 05/28/19 0809 05/28/19 1210      Inpatient Medications  Scheduled Meds: . albuterol  2 puff Inhalation Q6H  . amLODipine  10 mg Oral Daily  . aspirin EC  81 mg Oral Daily  . benzonatate  200 mg Oral TID  . chlorpheniramine-HYDROcodone  5 mL Oral Q12H  . cholecalciferol  1,000 Units Oral Daily  . dexamethasone  6 mg Oral Q24H  . enoxaparin (LOVENOX) injection  40 mg Subcutaneous Q24H  . pantoprazole  40 mg Oral Daily   . vitamin C  500 mg Oral Daily  . zinc sulfate  220 mg Oral Daily   Continuous Infusions: . sodium chloride Stopped (05/31/19 0947)   PRN Meds:.sodium chloride, acetaminophen, ALPRAZolam, guaiFENesin-dextromethorphan, [DISCONTINUED] ondansetron **OR** ondansetron (ZOFRAN) IV, sodium chloride flush   Time Spent in minutes  25   See all Orders from today for further details   Jeoffrey Massed M.D on 06/01/2019 at 10:39 AM  To page go to [email or web address] - use universal password  [facility] -  Office  [phone]    Objective:   Vitals:   05/31/19 1942 06/01/19 0359 06/01/19 0800 06/01/19 0851  BP: 115/64 128/65 132/74 138/82  Pulse: 60 (!) 59 (!) 57   Resp: 16 16 20    Temp: 98 F (36.7 C) 97.9 F (36.6 C) 97.6 F (36.4 C)   TempSrc: Oral Oral Oral   SpO2: 98% 94% 93%   Weight:      Height:        Wt Readings from Last 3 Encounters:  05/28/19 72.4 kg  05/27/19 72.6 kg  03/15/19 73.5 kg     Intake/Output Summary (Last 24 hours) at 06/01/2019 1039 Last data filed at 06/01/2019 0803 Gross per 24 hour  Intake -  Output 1175 ml  Net -1175 ml     Physical Exam Gen Exam:Alert awake-not in any distress HEENT:atraumatic, normocephalic Chest: B/L clear to auscultation anteriorly CVS:S1S2 regular Abdomen:soft non tender, non distended Extremities:no edema Neurology: Non focal Skin: no rash   Data Review:    CBC Recent Labs  Lab 05/28/19 0600 05/29/19 0205 05/30/19 0345 05/31/19 0340 06/01/19 0135  WBC 4.7 8.2 8.8 8.7 8.4  HGB 15.5 15.4 14.5 15.5 15.4  HCT 47.3 47.3 44.8 48.2 47.6  PLT 114* 126* 139* 143* 154  MCV 88.4 87.9 89.1 88.4 87.8  MCH 29.0 28.6 28.8 28.4 28.4  MCHC 32.8 32.6 32.4 32.2 32.4  RDW 13.5 13.5 13.6 13.6 13.6  LYMPHSABS 0.4* 0.6* 0.6* 0.7 0.8  MONOABS 0.1 0.6 0.7 0.9 0.7  EOSABS 0.0 0.0 0.0 0.0 0.0  BASOSABS 0.0 0.0 0.0 0.0 0.0    Chemistries  Recent Labs  Lab 05/28/19 0600 05/29/19 0205 05/30/19 0345 05/31/19 0340  06/01/19 0135  NA 138 139 139 137 137  K 5.0 4.7 4.1 4.3 4.2  CL 105 102 103 103 103  CO2 27 26 22 25 24   GLUCOSE 142* 123* 137* 118* 116*  BUN 24* 25* 28* 29* 31*  CREATININE 1.05  1.06 1.03 0.95 0.92 0.92  CALCIUM 8.5* 8.7* 8.3* 8.3* 8.3*  MG 1.9 2.1 1.9 1.9 1.9  AST 76* 81* 92* 79* 76*  ALT 56* 62* 82* 85* 99*  ALKPHOS 58 57 57 63 67  BILITOT 0.5 0.3 0.3 0.2* 0.5   ------------------------------------------------------------------------------------------------------------------ No results for input(s): CHOL, HDL, LDLCALC, TRIG, CHOLHDL, LDLDIRECT in the last 72 hours.  No results found for: HGBA1C ------------------------------------------------------------------------------------------------------------------ No results for input(s): TSH, T4TOTAL, T3FREE, THYROIDAB in the last 72 hours.  Invalid input(s): FREET3 ------------------------------------------------------------------------------------------------------------------ Recent Labs    05/31/19 0340 06/01/19 0135  FERRITIN 92 82    Coagulation profile No results for input(s): INR, PROTIME in the last 168 hours.  Recent Labs    05/31/19 0340 06/01/19 0135  DDIMER 0.64* 0.80*    Cardiac Enzymes No results for input(s): CKMB, TROPONINI, MYOGLOBIN in the last 168 hours.  Invalid input(s): CK ------------------------------------------------------------------------------------------------------------------    Component Value Date/Time   BNP 119.4 (H) 06/01/2019 0135    Micro Results Recent Results (from the past 240 hour(s))  Blood Culture (routine x 2)     Status: None (Preliminary result)   Collection Time: 05/27/19 11:54 PM   Specimen: BLOOD  Result Value Ref Range Status   Specimen Description BLOOD RIGHT ANTECUBITAL  Final   Special Requests   Final    BOTTLES DRAWN AEROBIC AND ANAEROBIC Blood Culture adequate volume   Culture   Final    NO GROWTH 4 DAYS Performed at [facility], [address] [place], [place] [address]    Report Status PENDING  Incomplete  Blood Culture (routine x 2)     Status: None (Preliminary result)   Collection Time: 05/27/19 11:54 PM   Specimen: BLOOD  Result Value Ref Range Status   Specimen Description BLOOD RIGHT HAND  Final   Special Requests   Final    BOTTLES DRAWN AEROBIC AND ANAEROBIC Blood Culture adequate volume   Culture   Final    NO GROWTH 4 DAYS Performed at [facility], [address] [place], [place] [address]    Report Status PENDING  Incomplete    Radiology Reports Dg Chest 2 View  Result Date: 05/27/2019 CLINICAL DATA:  Cough, covid EXAM: CHEST - 2 VIEW COMPARISON:  October 04, 2017 FINDINGS: The heart size and mediastinal contours are within normal limits. There is mildly increased hazy airspace opacity seen within the periphery of the right upper lung and the left lower lung. Aortic knob calcifications are seen. No acute osseous abnormality. IMPRESSION: Hazy peripheral airspace opacity is within both lungs. The findings in the lungs are nonspecific, but concerning for atypical infection, which includes viral pneumonia. Electronically Signed   By: Jonna ClarkBindu  Avutu M.D.   On: 05/27/2019 21:23

## 2019-06-01 NOTE — TOC Initial Note (Signed)
Transition of Care [facility]) - Initial/Assessment Note    Patient Details  Name: Alejandro Hicks MRN: [id number] Date of Birth: [birth year]  [facility] of [facility] CM/SW Contact:    Ninfa Meeker, RN Phone Number: [phone] (working remotely) 06/01/2019, 11:33 AM  Clinical Narrative:     85 yr old gentleman from home, COVID 19 positive. Case manager is following for oxygen needs at discharge.Patient as of this time has no need for [facility] services. Case manager will continue to monitor, as he medically improves. May he be blessed to do so.                   Patient Goals and CMS Choice        Expected Discharge Plan and Services                                                Prior Living Arrangements/Services                       Activities of Daily Living Home Assistive Devices/Equipment: None ADL Screening (condition at time of admission) Patient's cognitive ability adequate to safely complete daily activities?: Yes Is the patient deaf or have difficulty hearing?: No Does the patient have difficulty seeing, even when wearing glasses/contacts?: No Does the patient have difficulty concentrating, remembering, or making decisions?: No Patient able to express need for assistance with ADLs?: Yes Does the patient have difficulty dressing or bathing?: No Independently performs ADLs?: Yes (appropriate for developmental age) Does the patient have difficulty walking or climbing stairs?: No Weakness of Legs: None Weakness of Arms/Hands: None  Permission Sought/Granted                  Emotional Assessment              Admission diagnosis:  COVID-19 [U07.1] Patient Active Problem List   Diagnosis Date Noted  . Pneumonia due to 2019-nCoV 05/28/2019  . Primary osteoarthritis of right hip 12/13/2018  . Plantar fasciitis, right 02/16/2018  . Primary osteoarthritis of right ankle 01/19/2018  . Shoulder impingement syndrome, left 10/01/2017  .  Rib pain on right side 12/09/2015  . Right shoulder pain with history of right proximal biceps rupture 12/09/2015  . Essential hypertension, benign 10/21/2015  . Lumbar degenerative disc disease 09/02/2015  . DDD (degenerative disc disease), cervical 08/05/2015   PCP:  Wenda Low, MD Pharmacy:   [id number] [place], [place] [address] Phone: [phone] Fax: [phone]     Social Determinants of Health (SDOH) Interventions    Readmission Risk Interventions No flowsheet data found.

## 2019-06-01 NOTE — Progress Notes (Signed)
PT Cancellation Note  Patient Details Name: Alejandro Hicks MRN: [id number] DOB: 12/28/1933   Cancelled Treatment:    Reason Eval/Treat Not Completed: Medical issues which prohibited therapy, pt reports that he does not feel as well" I have double pneumonia, I don't know how much I can do". Getting IV, will check back later today.   Claretha Cooper 06/01/2019, 9:29 AM [place] Pager [phone] Office [phone]

## 2019-06-01 NOTE — Plan of Care (Signed)
S/W daughter on phone to discuss patient POC and current status.

## 2019-06-02 ENCOUNTER — Inpatient Hospital Stay (HOSPITAL_COMMUNITY): Payer: Medicare Other

## 2019-06-02 DIAGNOSIS — I10 Essential (primary) hypertension: Secondary | ICD-10-CM | POA: Diagnosis not present

## 2019-06-02 DIAGNOSIS — J1289 Other viral pneumonia: Secondary | ICD-10-CM | POA: Diagnosis not present

## 2019-06-02 DIAGNOSIS — U071 COVID-19: Secondary | ICD-10-CM | POA: Diagnosis not present

## 2019-06-02 LAB — COMPREHENSIVE METABOLIC PANEL
ALT: 95 U/L — ABNORMAL HIGH (ref 0–44)
AST: 62 U/L — ABNORMAL HIGH (ref 15–41)
Albumin: 3 g/dL — ABNORMAL LOW (ref 3.5–5.0)
Alkaline Phosphatase: 70 U/L (ref 38–126)
Anion gap: 13 (ref 5–15)
BUN: 34 mg/dL — ABNORMAL HIGH (ref 8–23)
CO2: 24 mmol/L (ref 22–32)
Calcium: 8.2 mg/dL — ABNORMAL LOW (ref 8.9–10.3)
Chloride: 101 mmol/L (ref 98–111)
Creatinine, Ser: 0.99 mg/dL (ref 0.61–1.24)
GFR calc Af Amer: >60 mL/min (ref >60)
GFR calc non Af Amer: >60 mL/min (ref >60)
Glucose, Bld: 186 mg/dL — ABNORMAL HIGH (ref 70–99)
Potassium: 3.8 mmol/L (ref 3.5–5.1)
Sodium: 138 mmol/L (ref 135–145)
Total Bilirubin: 0.7 mg/dL (ref 0.3–1.2)
Total Protein: 5.5 g/dL — ABNORMAL LOW (ref 6.5–8.1)

## 2019-06-02 LAB — CULTURE, BLOOD (ROUTINE X 2)
Culture: NO GROWTH
Culture: NO GROWTH
Special Requests: ADEQUATE
Special Requests: ADEQUATE

## 2019-06-02 LAB — LACTATE DEHYDROGENASE: LDH: 346 U/L — ABNORMAL HIGH (ref 98–192)

## 2019-06-02 LAB — C-REACTIVE PROTEIN: CRP: 1.1 mg/dL — ABNORMAL HIGH (ref <1.0)

## 2019-06-02 LAB — BRAIN NATRIURETIC PEPTIDE: B Natriuretic Peptide: 61 pg/mL (ref 0.0–100.0)

## 2019-06-02 LAB — FERRITIN: Ferritin: 68 ng/mL (ref 24–336)

## 2019-06-02 MED ORDER — MOMETASONE FURO-FORMOTEROL FUM 100-5 MCG/ACT IN AERO
2.0000 | INHALATION_SPRAY | Freq: Two times a day (BID) | RESPIRATORY_TRACT | Status: DC
  Administered 2019-06-02 – 2019-06-06 (×9): 2 via RESPIRATORY_TRACT
  Filled 2019-06-02: qty 8.8

## 2019-06-02 MED ORDER — FUROSEMIDE 10 MG/ML IJ SOLN
40.0000 mg | Freq: Once | INTRAMUSCULAR | Status: AC
  Administered 2019-06-02: 14:00:00 40 mg via INTRAVENOUS
  Filled 2019-06-02: qty 4

## 2019-06-02 NOTE — Plan of Care (Signed)
Talked with Daughter (in person, since now a patient too) to inform of current progress, POC and possible DC for tomorrow.

## 2019-06-02 NOTE — Progress Notes (Signed)
PROGRESS NOTE                                                                                                                                                                                                             Patient Demographics:    Alejandro Hicks, is a 85 y.o. male, DOB - 1933-10-21, YNW:[id number]  Outpatient Primary MD for the patient is Georgann Housekeeper, MD   Admit date - 05/28/2019   LOS - 4  No chief complaint on file.      Brief Narrative: Patient is a 85 y.o. male with PMHx of HTN, dyslipidemia, asthma who went to [facility] with family and friends-and subsequently came back with cough and shortness of breath-subsequently found to have COVID-19-he developed acute hypoxemic respiratory failure secondary to pneumonia.  He was then admitted to the hospitalist service for further treatment and evaluation-see below for further details.   Subjective:    Zahid Carneiro continues to have significant coughing spells.-Per RN-with ambulation his O2 saturation drops to the high 80s.  Claims that his daughter is now in the hospital.   Assessment  & Plan :   Acute Hypoxic Resp Failure due to Covid 19 Viral pneumonia: Although overall improved-lately has been having coughing spells and desaturation with exertion requiring O2 support.  Has completed a course of Remdesivir-continue steroids.  Continue scheduled Tessalon Perles and Tussionex.  Have added albuterol MDI inhaler yesterday-we will add inhaled steroids today.  We will give 1 more dose of IV Lasix today.  Chest x-ray done this morning shows pneumonia but really does not look that different from his prior chest x-ray.  Fever: afebrile  O2 requirements: 2 L of oxygen this morning.  COVID-19 Labs: Recent Labs    05/31/19 0340 06/01/19 0135 06/02/19 0025  DDIMER 0.64* 0.80*  --   FERRITIN 92 82 68  LDH 320* 371* 346*  CRP 1.1* 0.9 1.1*    No results found for:  SARSCOV2NAA   COVID-19 Medications: Steroids: 10/4>> Remdesivir:10/4>>10/8 Actemra: Not indicated Convalescent Plasma:Not indicated Research Studies:N/A  Other medications: Diuretics:Euvolemic-we will repeat Lasix IV x1 today. Antibiotics:Not needed as no evidence of bacterial infection  Prone/Incentive Spirometry: encouraged patient to lie prone for 3-4 hours at a time for a total of 16 hours a day, and to encourage incentive spirometry use 3-4/hour.  DVT Prophylaxis  :  Lovenox  Transaminitis: Secondary to COVID-19-improving-follow periodically.  Hypertension: Controlled-continue amlodipine  GERD: Continue PPI  Peripheral neuropathy: Continue Neurontin   ABG: No results found for: PHART, PCO2ART, PO2ART, HCO3, TCO2, ACIDBASEDEF, O2SAT  Vent Settings: N/AFiO2 (%):  [3 %] 3 %  Condition - Stable  Family Communication  : Daughter updated over the phone on 10/8-we will update family later Code Status :  Full Code Diet :  Diet Order            Diet Heart Room service appropriate? Yes; Fluid consistency: Thin  Diet effective now               Disposition Plan  :  Remain hospitalized-hopefully discharge home in the next day or so if he is somewhat better.  Barriers to discharge: Hypoxia  Consults  :  Non  Procedures  :  None  Antibiotics  :    Anti-infectives (From admission, onward)   Start     Dose/Rate Route Frequency Ordered Stop   05/29/19 1000  remdesivir 100 mg in sodium chloride 0.9 % 250 mL IVPB     100 mg 500 mL/hr over 30 Minutes Intravenous Every 24 hours 05/28/19 0809 06/01/19 0928   05/28/19 0900  remdesivir 200 mg in sodium chloride 0.9 % 250 mL IVPB     200 mg 500 mL/hr over 30 Minutes Intravenous Once 05/28/19 0809 05/28/19 1210      Inpatient Medications  Scheduled Meds: . albuterol  2 puff Inhalation Q6H  . amLODipine  10 mg Oral Daily  . aspirin EC  81 mg Oral Daily  . benzonatate  200 mg Oral TID  . chlorpheniramine-HYDROcodone   5 mL Oral Q12H  . cholecalciferol  1,000 Units Oral Daily  . dexamethasone  6 mg Oral Q24H  . enoxaparin (LOVENOX) injection  40 mg Subcutaneous Q24H  . pantoprazole  40 mg Oral Daily  . vitamin C  500 mg Oral Daily  . zinc sulfate  220 mg Oral Daily   Continuous Infusions: . sodium chloride Stopped (05/31/19 0947)   PRN Meds:.sodium chloride, acetaminophen, ALPRAZolam, guaiFENesin-dextromethorphan, [DISCONTINUED] ondansetron **OR** ondansetron (ZOFRAN) IV, sodium chloride flush   Time Spent in minutes  25   See all Orders from today for further details   Jeoffrey MassedShanker Stancil Deisher M.D on 06/02/2019 at 10:56 AM  To page go to [email or web address] - use universal password  [facility] -  Office  [phone]    Objective:   Vitals:   06/01/19 2030 06/02/19 0435 06/02/19 0752 06/02/19 0911  BP: 129/77 130/77 135/80   Pulse: 63 60 (!) 53   Resp:  20 18   Temp: 98.4 F (36.9 C) 98.1 F (36.7 C) 98.4 F (36.9 C)   TempSrc: Oral Oral Oral   SpO2: 93% (!) 3% 90% (!) 88%  Weight:      Height:        Wt Readings from Last 3 Encounters:  05/28/19 72.4 kg  05/27/19 72.6 kg  03/15/19 73.5 kg     Intake/Output Summary (Last 24 hours) at 06/02/2019 1056 Last data filed at 06/02/2019 0942 Gross per 24 hour  Intake 240 ml  Output 2050 ml  Net -1810 ml     Physical Exam Gen Exam:Alert awake-not in any distress HEENT:atraumatic, normocephalic Chest: B/L clear to auscultation anteriorly CVS:S1S2 regular Abdomen:soft non tender, non distended Extremities:no edema Neurology: Non focal Skin: no rash   Data Review:    CBC Recent Labs  Lab 05/28/19 0600 05/29/19 0205  05/30/19 0345 05/31/19 0340 06/01/19 0135  WBC 4.7 8.2 8.8 8.7 8.4  HGB 15.5 15.4 14.5 15.5 15.4  HCT 47.3 47.3 44.8 48.2 47.6  PLT 114* 126* 139* 143* 154  MCV 88.4 87.9 89.1 88.4 87.8  MCH 29.0 28.6 28.8 28.4 28.4  MCHC 32.8 32.6 32.4 32.2 32.4  RDW 13.5 13.5 13.6 13.6 13.6  LYMPHSABS 0.4* 0.6* 0.6*  0.7 0.8  MONOABS 0.1 0.6 0.7 0.9 0.7  EOSABS 0.0 0.0 0.0 0.0 0.0  BASOSABS 0.0 0.0 0.0 0.0 0.0    Chemistries  Recent Labs  Lab 05/28/19 0600 05/29/19 0205 05/30/19 0345 05/31/19 0340 06/01/19 0135 06/02/19 0025  NA 138 139 139 137 137 138  K 5.0 4.7 4.1 4.3 4.2 3.8  CL 105 102 103 103 103 101  CO2 27 26 22 25 24 24   GLUCOSE 142* 123* 137* 118* 116* 186*  BUN 24* 25* 28* 29* 31* 34*  CREATININE 1.05  1.06 1.03 0.95 0.92 0.92 0.99  CALCIUM 8.5* 8.7* 8.3* 8.3* 8.3* 8.2*  MG 1.9 2.1 1.9 1.9 1.9  --   AST 76* 81* 92* 79* 76* 62*  ALT 56* 62* 82* 85* 99* 95*  ALKPHOS 58 57 57 63 67 70  BILITOT 0.5 0.3 0.3 0.2* 0.5 0.7   ------------------------------------------------------------------------------------------------------------------ No results for input(s): CHOL, HDL, LDLCALC, TRIG, CHOLHDL, LDLDIRECT in the last 72 hours.  No results found for: HGBA1C ------------------------------------------------------------------------------------------------------------------ No results for input(s): TSH, T4TOTAL, T3FREE, THYROIDAB in the last 72 hours.  Invalid input(s): FREET3 ------------------------------------------------------------------------------------------------------------------ Recent Labs    06/01/19 0135 06/02/19 0025  FERRITIN 82 68    Coagulation profile No results for input(s): INR, PROTIME in the last 168 hours.  Recent Labs    05/31/19 0340 06/01/19 0135  DDIMER 0.64* 0.80*    Cardiac Enzymes No results for input(s): CKMB, TROPONINI, MYOGLOBIN in the last 168 hours.  Invalid input(s): CK ------------------------------------------------------------------------------------------------------------------    Component Value Date/Time   BNP 61.0 06/02/2019 0025    Micro Results Recent Results (from the past 240 hour(s))  Blood Culture (routine x 2)     Status: None   Collection Time: 05/27/19 11:54 PM   Specimen: BLOOD  Result Value Ref Range  Status   Specimen Description BLOOD RIGHT ANTECUBITAL  Final   Special Requests   Final    BOTTLES DRAWN AEROBIC AND ANAEROBIC Blood Culture adequate volume   Culture   Final    NO GROWTH 5 DAYS Performed at [facility], [address] [place], [place] [address]    Report Status 06/02/2019 FINAL  Final  Blood Culture (routine x 2)     Status: None   Collection Time: 05/27/19 11:54 PM   Specimen: BLOOD  Result Value Ref Range Status   Specimen Description BLOOD RIGHT HAND  Final   Special Requests   Final    BOTTLES DRAWN AEROBIC AND ANAEROBIC Blood Culture adequate volume   Culture   Final    NO GROWTH 5 DAYS Performed at [facility], [address], [place], [place] [address]    Report Status 06/02/2019 FINAL  Final    Radiology Reports Dg Chest 2 View  Result Date: 05/27/2019 CLINICAL DATA:  Cough, covid EXAM: CHEST - 2 VIEW COMPARISON:  October 04, 2017 FINDINGS: The heart size and mediastinal contours are within normal limits. There is mildly increased hazy airspace opacity seen within the periphery of the right upper lung and the left lower lung. Aortic knob calcifications are seen.  No acute osseous abnormality. IMPRESSION: Hazy peripheral airspace opacity is within both lungs. The findings in the lungs are nonspecific, but concerning for atypical infection, which includes viral pneumonia. Electronically Signed   By: Jonna Clark M.D.   On: 05/27/2019 21:23   Dg Chest Port 1v Same Day  Result Date: 06/02/2019 CLINICAL DATA:  Shortness of breath EXAM: PORTABLE CHEST 1 VIEW COMPARISON:  Six days ago FINDINGS: Patchy bilateral pneumonia with mild increase. Normal heart size accounting for elevated right diaphragm and technique. No edema, effusion, or pneumothorax IMPRESSION: COVID-19 with patchy bilateral pneumonia that is more conspicuous than on prior. Electronically Signed   By: Marnee Spring M.D.   On: 06/02/2019 08:55

## 2019-06-03 DIAGNOSIS — I10 Essential (primary) hypertension: Secondary | ICD-10-CM | POA: Diagnosis not present

## 2019-06-03 DIAGNOSIS — U071 COVID-19: Secondary | ICD-10-CM | POA: Diagnosis not present

## 2019-06-03 DIAGNOSIS — J1289 Other viral pneumonia: Secondary | ICD-10-CM | POA: Diagnosis not present

## 2019-06-03 LAB — CBC
HCT: 47.1 % (ref 39.0–52.0)
Hemoglobin: 15.3 g/dL (ref 13.0–17.0)
MCH: 28.5 pg (ref 26.0–34.0)
MCHC: 32.5 g/dL (ref 30.0–36.0)
MCV: 87.9 fL (ref 80.0–100.0)
Platelets: 199 10*3/uL (ref 150–400)
RBC: 5.36 MIL/uL (ref 4.22–5.81)
RDW: 13.6 % (ref 11.5–15.5)
WBC: 11.5 10*3/uL — ABNORMAL HIGH (ref 4.0–10.5)
nRBC: 0 % (ref 0.0–0.2)

## 2019-06-03 LAB — COMPREHENSIVE METABOLIC PANEL
ALT: 83 U/L — ABNORMAL HIGH (ref 0–44)
AST: 44 U/L — ABNORMAL HIGH (ref 15–41)
Albumin: 2.9 g/dL — ABNORMAL LOW (ref 3.5–5.0)
Alkaline Phosphatase: 65 U/L (ref 38–126)
Anion gap: 10 (ref 5–15)
BUN: 38 mg/dL — ABNORMAL HIGH (ref 8–23)
CO2: 25 mmol/L (ref 22–32)
Calcium: 8.1 mg/dL — ABNORMAL LOW (ref 8.9–10.3)
Chloride: 103 mmol/L (ref 98–111)
Creatinine, Ser: 1.01 mg/dL (ref 0.61–1.24)
GFR calc Af Amer: >60 mL/min (ref >60)
GFR calc non Af Amer: >60 mL/min (ref >60)
Glucose, Bld: 144 mg/dL — ABNORMAL HIGH (ref 70–99)
Potassium: 3.9 mmol/L (ref 3.5–5.1)
Sodium: 138 mmol/L (ref 135–145)
Total Bilirubin: 0.5 mg/dL (ref 0.3–1.2)
Total Protein: 5.4 g/dL — ABNORMAL LOW (ref 6.5–8.1)

## 2019-06-03 LAB — C-REACTIVE PROTEIN: CRP: 0.9 mg/dL (ref <1.0)

## 2019-06-03 LAB — D-DIMER, QUANTITATIVE: D-Dimer, Quant: 1 ug/mL-FEU — ABNORMAL HIGH (ref 0.00–0.50)

## 2019-06-03 LAB — FERRITIN: Ferritin: 61 ng/mL (ref 24–336)

## 2019-06-03 MED ORDER — FUROSEMIDE 10 MG/ML IJ SOLN
20.0000 mg | Freq: Once | INTRAMUSCULAR | Status: AC
  Administered 2019-06-03: 20 mg via INTRAVENOUS
  Filled 2019-06-03: qty 2

## 2019-06-03 NOTE — Plan of Care (Signed)
Patient talked with daughter earlier. She is a patient herself here. Deferred for staff to call her.

## 2019-06-03 NOTE — Progress Notes (Signed)
Spoke with pt daughter [name] and updated on pt plan of care. Answered all questions and concerns.

## 2019-06-03 NOTE — Progress Notes (Addendum)
SATURATION QUALIFICATIONS: (This note is used to comply with regulatory documentation for home oxygen)  Patient Saturations on Room Air at Rest = 90%  Patient Saturations on Room Air while Ambulating = 77%  Patient Saturations on 6 Liters of oxygen while Ambulating = 88%  Please briefly explain why patient needs home oxygen: Pt unable to recover O2 sats above 90% until resting in bed with O2 at 6L.    1154 AM Had patient reattempt while walking in room with pulse ox on ear to get better reading:   Patient Saturations on Room Air at Rest = 92%  Patient Saturations on Room Air while Ambulating = 85%  Patient Saturations on 4 Liters of oxygen while Ambulating = 90%  Please briefly explain why patient needs home oxygen: Pt unable to maintain sats above 90% without O2.

## 2019-06-03 NOTE — Progress Notes (Signed)
PROGRESS NOTE                                                                                                                                                                                                             Patient Demographics:    Alejandro Hicks, is a 85 y.o. male, DOB - [birth year], MGQ:[id number]  Outpatient Primary MD for the patient is Georgann Housekeeper, MD   Admit date - 05/28/2019   LOS - 5  No chief complaint on file.      Brief Narrative: Patient is a 85 y.o. male with PMHx of HTN, dyslipidemia, asthma who went to [facility] with family and friends-and subsequently came back with cough and shortness of breath-subsequently found to have COVID-19-he developed acute hypoxemic respiratory failure secondary to pneumonia.  He was then admitted to the hospitalist service for further treatment and evaluation-see below for further details.   Subjective:    Alejandro Hicks continues to cough-was on room air at rest-he thinks the coughing spells and shortness of breath is slightly better than yesterday.  Per nursing documentation-desaturated down to the 70s when he ambulated-required up to 6 L of oxygen.  Plans are for discharge-but will hold given this issue with significant desaturation.   Assessment  & Plan :   Acute Hypoxic Resp Failure due to Covid 19 Viral pneumonia: Overall improved-stable at rest but desaturates with exertion.  Inflammatory markers essentially normal.  Main issue continues to be severe coughing spells.  A few days back-we moved his O2 probe to his ear-as readings in the fingers were not felt to be accurate.  Has completed a course of Remdesivir-continue steroids-repeat 1 more dose of Lasix today.  Continue scheduled Tessalon Perles, Tussionex-continue inhaler regimen of albuterol and Dulera.  Patient lives with his daughter-who is currently a patient at [facility].  Per nursing staff-still continues to  desaturate significantly when he ambulates-although I am not sure how accurate this is as O2 probe is on the finger-however patient is still very short of breath when he ambulates (per RN).  Given this information-we will hold discharge for today-give 1 dose of Lasix-continue supportive care and reassess over the next few days.   Fever: afebrile  O2 requirements: RA-but requiring O2 with ambulation.  COVID-19 Labs: Recent Labs    06/01/19 0135 06/02/19 0025 06/03/19 0420  DDIMER  0.80*  --  1.00*  FERRITIN 82 68 61  LDH 371* 346*  --   CRP 0.9 1.1* 0.9    No results found for: SARSCOV2NAA   COVID-19 Medications: Steroids: 10/4>> Remdesivir:10/4>>10/8 Actemra: Not indicated Convalescent Plasma:Not indicated Research Studies:N/A  Other medications: Diuretics:Euvolemic-we will repeat Lasix IV x1 today. Antibiotics:Not needed as no evidence of bacterial infection  Prone/Incentive Spirometry: encouraged patient to lie prone for 3-4 hours at a time for a total of 16 hours a day, and to encourage incentive spirometry use 3-4/hour.  DVT Prophylaxis  :  Lovenox  Transaminitis: Secondary to COVID-19-improving-follow periodically.  Hypertension: Controlled-continue amlodipine  GERD: Continue PPI  Peripheral neuropathy: Continue Neurontin   ABG: No results found for: PHART, PCO2ART, PO2ART, HCO3, TCO2, ACIDBASEDEF, O2SAT  Vent Settings: N/A   Condition - Stable  Family Communication  : Daughter updated over the phone on 10/8-we will update family later Code Status :  Full Code Diet :  Diet Order            Diet Heart Room service appropriate? Yes; Fluid consistency: Thin  Diet effective now               Disposition Plan  :  Remain hospitalized-hopefully discharge home in the next day or so if he is somewhat better.  Barriers to discharge: Hypoxia  Consults  :  Non  Procedures  :  None  Antibiotics  :    Anti-infectives (From admission, onward)   Start      Dose/Rate Route Frequency Ordered Stop   05/29/19 1000  remdesivir 100 mg in sodium chloride 0.9 % 250 mL IVPB     100 mg 500 mL/hr over 30 Minutes Intravenous Every 24 hours 05/28/19 0809 06/01/19 0928   05/28/19 0900  remdesivir 200 mg in sodium chloride 0.9 % 250 mL IVPB     200 mg 500 mL/hr over 30 Minutes Intravenous Once 05/28/19 0809 05/28/19 1210      Inpatient Medications  Scheduled Meds: . albuterol  2 puff Inhalation Q6H  . amLODipine  10 mg Oral Daily  . aspirin EC  81 mg Oral Daily  . benzonatate  200 mg Oral TID  . chlorpheniramine-HYDROcodone  5 mL Oral Q12H  . cholecalciferol  1,000 Units Oral Daily  . dexamethasone  6 mg Oral Q24H  . enoxaparin (LOVENOX) injection  40 mg Subcutaneous Q24H  . mometasone-formoterol  2 puff Inhalation BID  . pantoprazole  40 mg Oral Daily  . vitamin C  500 mg Oral Daily  . zinc sulfate  220 mg Oral Daily   Continuous Infusions: . sodium chloride Stopped (05/31/19 0947)   PRN Meds:.sodium chloride, acetaminophen, ALPRAZolam, guaiFENesin-dextromethorphan, [DISCONTINUED] ondansetron **OR** ondansetron (ZOFRAN) IV, sodium chloride flush   Time Spent in minutes  25   See all Orders from today for further details   Oren Binet M.D on 06/03/2019 at 11:30 AM  To page go to [email or web address] - use universal password  [facility] -  Office  [phone]    Objective:   Vitals:   06/02/19 1605 06/02/19 2104 06/03/19 0432 06/03/19 0700  BP: 122/66 121/63  128/70  Pulse: 100     Resp: 20     Temp: 98.2 F (36.8 C) 97.8 F (36.6 C) (!) 97.4 F (36.3 C) 97.8 F (36.6 C)  TempSrc: Oral Oral Oral Oral  SpO2: 93%     Weight:      Height:        Wt  Readings from Last 3 Encounters:  05/28/19 72.4 kg  05/27/19 72.6 kg  03/15/19 73.5 kg     Intake/Output Summary (Last 24 hours) at 06/03/2019 1130 Last data filed at 06/02/2019 2100 Gross per 24 hour  Intake 480 ml  Output -  Net 480 ml     Physical Exam Gen  Exam:Alert awake-not in any distress HEENT:atraumatic, normocephalic Chest: B/L clear to auscultation anteriorly CVS:S1S2 regular Abdomen:soft non tender, non distended Extremities:no edema Neurology: Non focal Skin: no rash   Data Review:    CBC Recent Labs  Lab 05/28/19 0600 05/29/19 0205 05/30/19 0345 05/31/19 0340 06/01/19 0135 06/03/19 0420  WBC 4.7 8.2 8.8 8.7 8.4 11.5*  HGB 15.5 15.4 14.5 15.5 15.4 15.3  HCT 47.3 47.3 44.8 48.2 47.6 47.1  PLT 114* 126* 139* 143* 154 199  MCV 88.4 87.9 89.1 88.4 87.8 87.9  MCH 29.0 28.6 28.8 28.4 28.4 28.5  MCHC 32.8 32.6 32.4 32.2 32.4 32.5  RDW 13.5 13.5 13.6 13.6 13.6 13.6  LYMPHSABS 0.4* 0.6* 0.6* 0.7 0.8  --   MONOABS 0.1 0.6 0.7 0.9 0.7  --   EOSABS 0.0 0.0 0.0 0.0 0.0  --   BASOSABS 0.0 0.0 0.0 0.0 0.0  --     Chemistries  Recent Labs  Lab 05/28/19 0600 05/29/19 0205 05/30/19 0345 05/31/19 0340 06/01/19 0135 06/02/19 0025 06/03/19 0420  NA 138 139 139 137 137 138 138  K 5.0 4.7 4.1 4.3 4.2 3.8 3.9  CL 105 102 103 103 103 101 103  CO2 27 26 22 25 24 24 25   GLUCOSE 142* 123* 137* 118* 116* 186* 144*  BUN 24* 25* 28* 29* 31* 34* 38*  CREATININE 1.05  1.06 1.03 0.95 0.92 0.92 0.99 1.01  CALCIUM 8.5* 8.7* 8.3* 8.3* 8.3* 8.2* 8.1*  MG 1.9 2.1 1.9 1.9 1.9  --   --   AST 76* 81* 92* 79* 76* 62* 44*  ALT 56* 62* 82* 85* 99* 95* 83*  ALKPHOS 58 57 57 63 67 70 65  BILITOT 0.5 0.3 0.3 0.2* 0.5 0.7 0.5   ------------------------------------------------------------------------------------------------------------------ No results for input(s): CHOL, HDL, LDLCALC, TRIG, CHOLHDL, LDLDIRECT in the last 72 hours.  No results found for: HGBA1C ------------------------------------------------------------------------------------------------------------------ No results for input(s): TSH, T4TOTAL, T3FREE, THYROIDAB in the last 72 hours.  Invalid input(s):  FREET3 ------------------------------------------------------------------------------------------------------------------ Recent Labs    06/02/19 0025 06/03/19 0420  FERRITIN 68 61    Coagulation profile No results for input(s): INR, PROTIME in the last 168 hours.  Recent Labs    06/01/19 0135 06/03/19 0420  DDIMER 0.80* 1.00*    Cardiac Enzymes No results for input(s): CKMB, TROPONINI, MYOGLOBIN in the last 168 hours.  Invalid input(s): CK ------------------------------------------------------------------------------------------------------------------    Component Value Date/Time   BNP 61.0 06/02/2019 0025    Micro Results Recent Results (from the past 240 hour(s))  Blood Culture (routine x 2)     Status: None   Collection Time: 05/27/19 11:54 PM   Specimen: BLOOD  Result Value Ref Range Status   Specimen Description BLOOD RIGHT ANTECUBITAL  Final   Special Requests   Final    BOTTLES DRAWN AEROBIC AND ANAEROBIC Blood Culture adequate volume   Culture   Final    NO GROWTH 5 DAYS Performed at [facility], [address] [place], [place] [id number]    Report Status 06/02/2019 FINAL  Final  Blood Culture (routine x 2)     Status: None   Collection Time:  05/27/19 11:54 PM   Specimen: BLOOD  Result Value Ref Range Status   Specimen Description BLOOD RIGHT HAND  Final   Special Requests   Final    BOTTLES DRAWN AEROBIC AND ANAEROBIC Blood Culture adequate volume   Culture   Final    NO GROWTH 5 DAYS Performed at [facility], [address] [place], [place] [address]    Report Status 06/02/2019 FINAL  Final    Radiology Reports Dg Chest 2 View  Result Date: 05/27/2019 CLINICAL DATA:  Cough, covid EXAM: CHEST - 2 VIEW COMPARISON:  October 04, 2017 FINDINGS: The heart size and mediastinal contours are within normal limits. There is mildly increased hazy airspace opacity seen within the periphery of the right upper lung and the left lower  lung. Aortic knob calcifications are seen. No acute osseous abnormality. IMPRESSION: Hazy peripheral airspace opacity is within both lungs. The findings in the lungs are nonspecific, but concerning for atypical infection, which includes viral pneumonia. Electronically Signed   By: Jonna Clark M.D.   On: 05/27/2019 21:23   Dg Chest Port 1v Same Day  Result Date: 06/02/2019 CLINICAL DATA:  Shortness of breath EXAM: PORTABLE CHEST 1 VIEW COMPARISON:  Six days ago FINDINGS: Patchy bilateral pneumonia with mild increase. Normal heart size accounting for elevated right diaphragm and technique. No edema, effusion, or pneumothorax IMPRESSION: COVID-19 with patchy bilateral pneumonia that is more conspicuous than on prior. Electronically Signed   By: Marnee Spring M.D.   On: 06/02/2019 08:55

## 2019-06-03 NOTE — TOC Transition Note (Signed)
Transition of Care [facility]) - CM/SW Discharge Note   Patient Details  Name: Alejandro Hicks MRN: [id number] Date of Birth: 15-Apr-1934  Transition of Care [facility]) CM/SW Contact:  Ninfa Meeker, RN Phone Number: [phone] (working remotely) 06/03/2019, 10:37 AM   Clinical Narrative:   Patient is a 85 y.o. male with PMHx of HTN, dyslipidemia, asthma who went to [facility] with family and friends-and subsequently came back with cough and shortness of breath-subsequently found to have COVID-19-he developed acute hypoxemic respiratory failure secondary to pneumonia. Patient lives with his daughter [name] and son-in-law-[name]. [name] is also a patient at [facility]. Case manager spoke with her briefly concerning her dad's need for oxygen. Crista [phone] to discuss oxygen delivery and confirmed address. Referral was called to [name] with [employer]. Patient is currently on [facility], gave [name] this information to be sure he has adequate supply delivered. [place] [place] [name] was requested to bring portable tank to patients room for transport home. No further needs identified.     Final next level of care: Home/Self Care Barriers to Discharge: No Barriers Identified   Patient Goals and CMS Choice Patient states their goals for this hospitalization and ongoing recovery are:: get better   Choice offered to / list presented to : Adult Children  Discharge Placement                       Discharge Plan and Services   Discharge Planning Services: CM Consult Post Acute Care Choice: Durable Medical Equipment          DME Arranged: Oxygen DME Agency: [facility] Date DME Agency Contacted: 06/03/19 Time DME Agency Contacted: [phone] Representative spoke with at DME Agency: [place]: NA        Social Determinants of Health ([place]) Interventions     Readmission Risk Interventions No flowsheet data found.

## 2019-06-03 NOTE — Care Management (Signed)
Case manager spoke with Dr.Ghimire, patient may not discharge today due to oxygen needs. Sats dropped to 77% Ra, needed 6L at recovery. We will continue to monitor. Home oxygen being delivered to residence today.   Ricki Miller, RN BSN Case Manager  [phone]

## 2019-06-04 DIAGNOSIS — U071 COVID-19: Secondary | ICD-10-CM | POA: Diagnosis not present

## 2019-06-04 DIAGNOSIS — J1289 Other viral pneumonia: Secondary | ICD-10-CM | POA: Diagnosis not present

## 2019-06-04 LAB — COMPREHENSIVE METABOLIC PANEL
ALT: 83 U/L — ABNORMAL HIGH (ref 0–44)
AST: 45 U/L — ABNORMAL HIGH (ref 15–41)
Albumin: 2.8 g/dL — ABNORMAL LOW (ref 3.5–5.0)
Alkaline Phosphatase: 66 U/L (ref 38–126)
Anion gap: 10 (ref 5–15)
BUN: 36 mg/dL — ABNORMAL HIGH (ref 8–23)
CO2: 28 mmol/L (ref 22–32)
Calcium: 8.2 mg/dL — ABNORMAL LOW (ref 8.9–10.3)
Chloride: 100 mmol/L (ref 98–111)
Creatinine, Ser: 0.99 mg/dL (ref 0.61–1.24)
GFR calc Af Amer: >60 mL/min (ref >60)
GFR calc non Af Amer: >60 mL/min (ref >60)
Glucose, Bld: 130 mg/dL — ABNORMAL HIGH (ref 70–99)
Potassium: 4.5 mmol/L (ref 3.5–5.1)
Sodium: 138 mmol/L (ref 135–145)
Total Bilirubin: 0.5 mg/dL (ref 0.3–1.2)
Total Protein: 5.5 g/dL — ABNORMAL LOW (ref 6.5–8.1)

## 2019-06-04 LAB — BRAIN NATRIURETIC PEPTIDE: B Natriuretic Peptide: 33.7 pg/mL (ref 0.0–100.0)

## 2019-06-04 LAB — C-REACTIVE PROTEIN: CRP: 0.8 mg/dL (ref <1.0)

## 2019-06-04 LAB — D-DIMER, QUANTITATIVE: D-Dimer, Quant: 1.05 ug/mL-FEU — ABNORMAL HIGH (ref 0.00–0.50)

## 2019-06-04 LAB — FERRITIN: Ferritin: 61 ng/mL (ref 24–336)

## 2019-06-04 MED ORDER — SALINE SPRAY 0.65 % NA SOLN
1.0000 | NASAL | Status: DC | PRN
  Filled 2019-06-04: qty 44

## 2019-06-04 NOTE — Plan of Care (Signed)
Patient will update family on own. Daughter is a patient herself at [facility].

## 2019-06-04 NOTE — Progress Notes (Signed)
PROGRESS NOTE                                                                                                                                                                                                             Patient Demographics:    Alejandro Hicks, is a 85 y.o. male, DOB - [birth year], PPI:[id number]  Outpatient Primary MD for the patient is Wenda Low, MD   Admit date - 05/28/2019   LOS - 6  No chief complaint on file.      Brief Narrative: Patient is a 85 y.o. male with PMHx of HTN, dyslipidemia, asthma who went to [facility] with family and friends-and subsequently came back with cough and shortness of breath-subsequently found to have COVID-19-he developed acute hypoxemic respiratory failure secondary to pneumonia.  He was then admitted to the hospitalist service for further treatment and evaluation-see below for further details.   Subjective:   Patient in bed, appears comfortable, denies any headache, no fever, no chest pain or pressure, no shortness of breath at rest , no abdominal pain. No focal weakness.   Assessment  & Plan :   Acute Hypoxic Resp Failure due to Covid 19 Viral pneumonia: Overall improved-stable at rest but desaturates with exertion.  Inflammatory markers essentially normal.  Main issue continues to be severe coughing spells.  He has been treated with IV Remdisvir and steroids, at rest he is symptom-free but when he walks he still gets little hypoxic.  Was given a dose of Lasix IV on 06/03/2019.  Today he is having breakfast in chair and feels symptom-free, will ambulate throughout the day, will also order home oxygen for exertion, if overall stable discharge on 06/05/2019 on home oxygen.  COVID-19 Labs: Recent Labs    06/02/19 0025 06/03/19 0420 06/04/19 0339  DDIMER  --  1.00* 1.05*  FERRITIN 68 61  --   LDH 346*  --   --   CRP 1.1* 0.9  --     No results found for: SARSCOV2NAA    COVID-19 Medications: Steroids: 10/4>> Remdesivir:10/4>>10/8 Actemra: Not indicated Convalescent Plasma:Not indicated Research Studies:N/A  Hepatic Function Latest Ref Rng & Units 06/03/2019 06/02/2019 06/01/2019  Total Protein 6.5 - 8.1 g/dL 5.4(L) 5.5(L) 5.6(L)  Albumin 3.5 - 5.0 g/dL 2.9(L) 3.0(L) 3.0(L)  AST 15 - 41 U/L 44(H) 62(H) 76(H)  ALT  0 - 44 U/L 83(H) 95(H) 99(H)  Alk Phosphatase 38 - 126 U/L 65 70 67  Total Bilirubin 0.3 - 1.2 mg/dL 0.5 0.7 0.5    Transaminitis: Secondary to COVID-19-improving-follow periodically.  Trend is stable.  Hypertension: Controlled-continue amlodipine  GERD: Continue PPI  Peripheral neuropathy: Continue Neurontin     Condition - Stable  Family Communication  : Daughter updated over the phone on 10/8-we will update family later  Code Status :  Full Code  Diet :  Diet Order            Diet Heart Room service appropriate? Yes; Fluid consistency: Thin  Diet effective now               Disposition Plan  : Stable likely discharge 06/05/2019 on home oxygen to be used as needed.  Barriers to discharge: Hypoxia  Consults  :  Non  Procedures  :  None  Antibiotics  :    Anti-infectives (From admission, onward)   Start     Dose/Rate Route Frequency Ordered Stop   05/29/19 1000  remdesivir 100 mg in sodium chloride 0.9 % 250 mL IVPB     100 mg 500 mL/hr over 30 Minutes Intravenous Every 24 hours 05/28/19 0809 06/01/19 0928   05/28/19 0900  remdesivir 200 mg in sodium chloride 0.9 % 250 mL IVPB     200 mg 500 mL/hr over 30 Minutes Intravenous Once 05/28/19 0809 05/28/19 1210     DVT Prophylaxis  :  Lovenox  Inpatient Medications  Scheduled Meds: . albuterol  2 puff Inhalation Q6H  . amLODipine  10 mg Oral Daily  . aspirin EC  81 mg Oral Daily  . benzonatate  200 mg Oral TID  . chlorpheniramine-HYDROcodone  5 mL Oral Q12H  . cholecalciferol  1,000 Units Oral Daily  . dexamethasone  6 mg Oral Q24H  . enoxaparin  (LOVENOX) injection  40 mg Subcutaneous Q24H  . mometasone-formoterol  2 puff Inhalation BID  . pantoprazole  40 mg Oral Daily  . vitamin C  500 mg Oral Daily  . zinc sulfate  220 mg Oral Daily   Continuous Infusions: . sodium chloride Stopped (05/31/19 0947)   PRN Meds:.sodium chloride, acetaminophen, ALPRAZolam, guaiFENesin-dextromethorphan, [DISCONTINUED] ondansetron **OR** ondansetron (ZOFRAN) IV, sodium chloride flush   Time Spent in minutes  25   See all Orders from today for further details   Lala Lund M.D on 06/04/2019 at 9:18 AM  To page go to [email or web address] - use universal password  [facility] -  Office  [phone]    Objective:   Vitals:   06/03/19 1500 06/03/19 1945 06/03/19 1946 06/04/19 0441  BP: 129/69 (!) 139/58  133/75  Pulse: 73 78    Resp: _0 Temp: 97.8 F (36.6 C) 97.9 F (36.6 C)  97.8 F (36.6 C)  TempSrc: Oral Oral  Oral  SpO2: 98% 91%    Weight:      Height:        Wt Readings from Last 3 Encounters:  05/28/19 72.4 kg  05/27/19 72.6 kg  03/15/19 73.5 kg     Intake/Output Summary (Last 24 hours) at 06/04/2019 0918 Last data filed at 06/03/2019 2100 Gross per 24 hour  Intake 240 ml  Output 700 ml  Net -460 ml     Physical Exam  Awake Alert,  No new F.N deficits, Normal affect [place].AT,PERRAL Supple Neck,No JVD, No cervical lymphadenopathy appriciated.  Symmetrical Chest wall movement, Good  air movement bilaterally, CTAB RRR,No Gallops, Rubs or new Murmurs, No Parasternal Heave +ve B.Sounds, Abd Soft, No tenderness, No organomegaly appriciated, No rebound - guarding or rigidity. No Cyanosis, Clubbing or edema, No new Rash or bruise    Data Review:    CBC Recent Labs  Lab 05/29/19 0205 05/30/19 0345 05/31/19 0340 06/01/19 0135 06/03/19 0420  WBC 8.2 8.8 8.7 8.4 11.5*  HGB 15.4 14.5 15.5 15.4 15.3  HCT 47.3 44.8 48.2 47.6 47.1  PLT 126* 139* 143* 154 199  MCV 87.9 89.1 88.4 87.8 87.9  MCH 28.6  28.8 28.4 28.4 28.5  MCHC 32.6 32.4 32.2 32.4 32.5  RDW 13.5 13.6 13.6 13.6 13.6  LYMPHSABS 0.6* 0.6* 0.7 0.8  --   MONOABS 0.6 0.7 0.9 0.7  --   EOSABS 0.0 0.0 0.0 0.0  --   BASOSABS 0.0 0.0 0.0 0.0  --     Chemistries  Recent Labs  Lab 05/29/19 0205 05/30/19 0345 05/31/19 0340 06/01/19 0135 06/02/19 0025 06/03/19 0420  NA 139 139 137 137 138 138  K 4.7 4.1 4.3 4.2 3.8 3.9  CL 102 103 103 103 101 103  CO2 _0 GLUCOSE 123* 137* 118* 116* 186* 144*  BUN 25* 28* 29* 31* 34* 38*  CREATININE 1.03 0.95 0.92 0.92 0.99 1.01  CALCIUM 8.7* 8.3* 8.3* 8.3* 8.2* 8.1*  MG 2.1 1.9 1.9 1.9  --   --   AST 81* 92* 79* 76* 62* 44*  ALT 62* 82* 85* 99* 95* 83*  ALKPHOS 57 57 63 67 70 65  BILITOT 0.3 0.3 0.2* 0.5 0.7 0.5   ------------------------------------------------------------------------------------------------------------------ No results for input(s): CHOL, HDL, LDLCALC, TRIG, CHOLHDL, LDLDIRECT in the last 72 hours.  No results found for: HGBA1C ------------------------------------------------------------------------------------------------------------------ No results for input(s): TSH, T4TOTAL, T3FREE, THYROIDAB in the last 72 hours.  Invalid input(s): FREET3 ------------------------------------------------------------------------------------------------------------------ Recent Labs    06/02/19 0025 06/03/19 0420  FERRITIN 68 61    Coagulation profile No results for input(s): INR, PROTIME in the last 168 hours.  Recent Labs    06/03/19 0420 06/04/19 0339  DDIMER 1.00* 1.05*    Cardiac Enzymes No results for input(s): CKMB, TROPONINI, MYOGLOBIN in the last 168 hours.  Invalid input(s): CK ------------------------------------------------------------------------------------------------------------------    Component Value Date/Time   BNP 61.0 06/02/2019 0025    Micro Results Recent Results (from the past 240 hour(s))  Blood Culture (routine x  2)     Status: None   Collection Time: 05/27/19 11:54 PM   Specimen: BLOOD  Result Value Ref Range Status   Specimen Description BLOOD RIGHT ANTECUBITAL  Final   Special Requests   Final    BOTTLES DRAWN AEROBIC AND ANAEROBIC Blood Culture adequate volume   Culture   Final    NO GROWTH 5 DAYS Performed at [facility], [address], [place], [place] [address]    Report Status 06/02/2019 FINAL  Final  Blood Culture (routine x 2)     Status: None   Collection Time: 05/27/19 11:54 PM   Specimen: BLOOD  Result Value Ref Range Status   Specimen Description BLOOD RIGHT HAND  Final   Special Requests   Final    BOTTLES DRAWN AEROBIC AND ANAEROBIC Blood Culture adequate volume   Culture   Final    NO GROWTH 5 DAYS Performed at [facility], [address] [place],  [address]    Report Status 06/02/2019 FINAL  Final  Radiology Reports Dg Chest 2 View  Result Date: 05/27/2019 CLINICAL DATA:  Cough, covid EXAM: CHEST - 2 VIEW COMPARISON:  October 04, 2017 FINDINGS: The heart size and mediastinal contours are within normal limits. There is mildly increased hazy airspace opacity seen within the periphery of the right upper lung and the left lower lung. Aortic knob calcifications are seen. No acute osseous abnormality. IMPRESSION: Hazy peripheral airspace opacity is within both lungs. The findings in the lungs are nonspecific, but concerning for atypical infection, which includes viral pneumonia. Electronically Signed   By: Prudencio Pair M.D.   On: 05/27/2019 21:23   Dg Chest Port 1v Same Day  Result Date: 06/02/2019 CLINICAL DATA:  Shortness of breath EXAM: PORTABLE CHEST 1 VIEW COMPARISON:  Six days ago FINDINGS: Patchy bilateral pneumonia with mild increase. Normal heart size accounting for elevated right diaphragm and technique. No edema, effusion, or pneumothorax IMPRESSION: COVID-19 with patchy bilateral pneumonia that is more conspicuous than on prior.  Electronically Signed   By: Monte Fantasia M.D.   On: 06/02/2019 08:55

## 2019-06-04 NOTE — Progress Notes (Signed)
Unable to reach primary contact [place]. Left voice message.

## 2019-06-05 DIAGNOSIS — J1289 Other viral pneumonia: Secondary | ICD-10-CM | POA: Diagnosis not present

## 2019-06-05 DIAGNOSIS — U071 COVID-19: Secondary | ICD-10-CM | POA: Diagnosis not present

## 2019-06-05 LAB — COMPREHENSIVE METABOLIC PANEL
ALT: 99 U/L — ABNORMAL HIGH (ref 0–44)
AST: 57 U/L — ABNORMAL HIGH (ref 15–41)
Albumin: 3 g/dL — ABNORMAL LOW (ref 3.5–5.0)
Alkaline Phosphatase: 71 U/L (ref 38–126)
Anion gap: 14 (ref 5–15)
BUN: 34 mg/dL — ABNORMAL HIGH (ref 8–23)
CO2: 24 mmol/L (ref 22–32)
Calcium: 8.2 mg/dL — ABNORMAL LOW (ref 8.9–10.3)
Chloride: 99 mmol/L (ref 98–111)
Creatinine, Ser: 0.94 mg/dL (ref 0.61–1.24)
GFR calc Af Amer: >60 mL/min (ref >60)
GFR calc non Af Amer: >60 mL/min (ref >60)
Glucose, Bld: 134 mg/dL — ABNORMAL HIGH (ref 70–99)
Potassium: 4.4 mmol/L (ref 3.5–5.1)
Sodium: 137 mmol/L (ref 135–145)
Total Bilirubin: 0.7 mg/dL (ref 0.3–1.2)
Total Protein: 6.1 g/dL — ABNORMAL LOW (ref 6.5–8.1)

## 2019-06-05 LAB — CBC WITH DIFFERENTIAL/PLATELET
Abs Immature Granulocytes: 0.57 10*3/uL — ABNORMAL HIGH (ref 0.00–0.07)
Basophils Absolute: 0.1 10*3/uL (ref 0.0–0.1)
Basophils Relative: 1 %
Eosinophils Absolute: 0 10*3/uL (ref 0.0–0.5)
Eosinophils Relative: 0 %
HCT: 53.3 % — ABNORMAL HIGH (ref 39.0–52.0)
Hemoglobin: 16.9 g/dL (ref 13.0–17.0)
Immature Granulocytes: 5 %
Lymphocytes Relative: 5 %
Lymphs Abs: 0.6 10*3/uL — ABNORMAL LOW (ref 0.7–4.0)
MCH: 28.2 pg (ref 26.0–34.0)
MCHC: 31.7 g/dL (ref 30.0–36.0)
MCV: 89 fL (ref 80.0–100.0)
Monocytes Absolute: 0.7 10*3/uL (ref 0.1–1.0)
Monocytes Relative: 6 %
Neutro Abs: 10.4 10*3/uL — ABNORMAL HIGH (ref 1.7–7.7)
Neutrophils Relative %: 83 %
Platelets: 217 10*3/uL (ref 150–400)
RBC: 5.99 MIL/uL — ABNORMAL HIGH (ref 4.22–5.81)
RDW: 14.2 % (ref 11.5–15.5)
WBC: 12.3 10*3/uL — ABNORMAL HIGH (ref 4.0–10.5)
nRBC: 0.2 % (ref 0.0–0.2)

## 2019-06-05 LAB — C-REACTIVE PROTEIN: CRP: <0.8 mg/dL (ref <1.0)

## 2019-06-05 NOTE — Care Management (Addendum)
Patient to DC 10/13 per MD. O2  Through [employer], I confirmed tank and pulse have been taken to room and concentrator is at home. HH Daughter [name] interested in [facility] RN [facility]- no  Patient will return to home with daughter [name] in room [phone]) and son in law. All have tested +. SIL seen in ED last night and sent home w inhalers. [name] is expected to DC tomorrow with patient. SIL will provide transportation home for both. No other DME needed.

## 2019-06-05 NOTE — Progress Notes (Signed)
Physical Therapy Treatment Patient Details Name: Alejandro Hicks MRN: [id number] DOB: 1933-09-22 Today's Date: 06/05/2019    History of Present Illness 85 year old Caucasian male with history of hypertension, asthma, dyslipidemia who went to [facility] with family and friends on a vacation and came back with some cough and shortness of breath which started around 3 days ago, he was then checked for COVID-19 and was +3 days ago.  He developed mild nausea vomiting and diarrhea but subsequently developed some exertional shortness of breath and was then admitted to the hospital    PT Comments    The patient  Expresses disappointment in not being able to Dc today  due daughter is a pt. Here, Patient also demonstrates more SOB and coughing more with activity. The patient was able to ambulate 400' on RA last week, Today, tolerated 100' with increased Dyspnea and requiring O2 at 2 liters to recover from 85% SPO2. Recommend HHPT to ensure that patient is improving as he has actually  digressed in his tolerance to mobility with increased SOB.His B/B are on second  Level at his house. /    Follow Up Recommendations  Home health PT     Equipment Recommendations  None recommended by PT    Recommendations for Other Services OT consult     Precautions / Restrictions Precautions Precaution Comments: monitor sats, now tolerates less ambulation distance and is  miore SOB and desats    Mobility  Bed Mobility               General bed mobility comments: oob  Transfers Overall transfer level: Independent                  Ambulation/Gait Ambulation/Gait assistance: Supervision Gait Distance (Feet): 100 Feet Assistive device: None Gait Pattern/deviations: Step-to pattern Gait velocity: dec   General Gait Details: SO O2 85%, on RA, pt. with 3-4/4 dyspnea, placed on 2 l after sat down with return to 90%. placed back on RA   Stairs             Wheelchair Mobility     Modified Rankin (Stroke Patients Only)       Balance                                            Cognition Arousal/Alertness: Awake/alert Behavior During Therapy: WFL for tasks assessed/performed;Flat affect Overall Cognitive Status: Within Functional Limits for tasks assessed                                 General Comments: seems more down about still being in hospital, about coughing and being more SOB, asking for O2      Exercises      General Comments        Pertinent Vitals/Pain Pain Assessment: Faces Faces Pain Scale: Hurts even more Pain Location: chest when coughing Pain Descriptors / Indicators: Aching;Discomfort Pain Intervention(s): Monitored during session    Home Living                      Prior Function            PT Goals (current goals can now be found in the care plan section) Progress towards PT goals: Not progressing toward goals - comment(pt is more SOB,  not tolerating the distance  as initially tolerated)    Frequency    Min 3X/week      PT Plan Current plan remains appropriate;Discharge plan needs to be updated    Co-evaluation              AM-PAC PT "6 Clicks" Mobility   Outcome Measure  Help needed turning from your back to your side while in a flat bed without using bedrails?: None Help needed moving from lying on your back to sitting on the side of a flat bed without using bedrails?: None Help needed moving to and from a bed to a chair (including a wheelchair)?: None Help needed standing up from a chair using your arms (e.g., wheelchair or bedside chair)?: A Little Help needed to walk in hospital room?: A Little Help needed climbing 3-5 steps with a railing? : A Lot 6 Click Score: 20    End of Session   Activity Tolerance: Treatment limited secondary to medical complications (Comment) Patient left: in chair;with call bell/phone within reach Nurse Communication: Mobility  status PT Visit Diagnosis: Muscle weakness (generalized) (M62.81);Difficulty in walking, not elsewhere classified (R26.2)     Time: 4462-8638 PT Time Calculation (min) (ACUTE ONLY): 12 min  Charges:  $Gait Training: 8-22 mins                     Blanchard Kelch PT Acute Rehabilitation Services Pager [phone] Office [phone]    Rada Hay 06/05/2019, 1:04 PM

## 2019-06-05 NOTE — Progress Notes (Signed)
PROGRESS NOTE                                                                                                                                                                                                             Patient Demographics:    Alejandro Hicks, is a 85 y.o. male, DOB - [birth year], ONG:[id number]  Outpatient Primary MD for the patient is Wenda Low, MD   Admit date - 05/28/2019   LOS - 7  No chief complaint on file.      Brief Narrative: Patient is a 85 y.o. male with PMHx of HTN, dyslipidemia, asthma who went to [facility] with family and friends-and subsequently came back with cough and shortness of breath-subsequently found to have COVID-19-he developed acute hypoxemic respiratory failure secondary to pneumonia.  He was then admitted to the hospitalist service for further treatment and evaluation-see below for further details.   Subjective:   Patient in bed, appears comfortable, denies any headache, no fever, no chest pain or pressure, no shortness of breath , no abdominal pain. No focal weakness.   Assessment  & Plan :   Acute Hypoxic Resp Failure due to Covid 19 Viral pneumonia: Overall improved-stable at rest but desaturates with exertion.  Inflammatory markers essentially normal.  Main issue continues to be severe coughing spells.  He has been treated with IV Remdisvir and steroids, at rest he is symptom-free but when he walks he still gets little hypoxic.  Was given a dose of Lasix IV on 06/03/2019.  Today he is having breakfast in chair and feels symptom-free, will ambulate throughout the day, will also order home oxygen for exertion.  He is now stable at rest, requiring oxygen when he ambulates and home oxygen has been arranged.  However he lives with his daughter and son-in-law both of whom are now hospitalized with COVID-19, unsafe for home discharge with his ongoing weakness, request PT to evaluate  again, if he does well with PT will try and find safe disposition for him on 08/06/2019.   COVID-19 Labs: Recent Labs    06/03/19 0420 06/04/19 0339 06/05/19 0500  DDIMER 1.00* 1.05*  --   FERRITIN 61 61  --   CRP 0.9 0.8 <0.8    No results found for: SARSCOV2NAA   COVID-19 Medications: Steroids: 10/4>> Remdesivir:10/4>>10/8 Actemra: Not indicated Convalescent Plasma:Not indicated  Research Studies:N/A  Hepatic Function Latest Ref Rng & Units 06/05/2019 06/04/2019 06/03/2019  Total Protein 6.5 - 8.1 g/dL 6.1(L) 5.5(L) 5.4(L)  Albumin 3.5 - 5.0 g/dL 3.0(L) 2.8(L) 2.9(L)  AST 15 - 41 U/L 57(H) 45(H) 44(H)  ALT 0 - 44 U/L 99(H) 83(H) 83(H)  Alk Phosphatase 38 - 126 U/L 71 66 65  Total Bilirubin 0.3 - 1.2 mg/dL 0.7 0.5 0.5    Transaminitis: Secondary to COVID-19-improving-follow periodically.  Trend is stable.  Hypertension: Controlled-continue amlodipine  GERD: Continue PPI  Peripheral neuropathy: Continue Neurontin     Condition - Stable  Family Communication  : Daughter updated over the phone on 10/8, daughter now hospitalized  Code Status :  Full Code  Diet :  Diet Order            Diet Heart Room service appropriate? Yes; Fluid consistency: Thin  Diet effective now               Disposition Plan  : Discharge once case management figures out safe disposition.  Barriers to discharge: Hypoxia  Consults  :  None  Procedures  :  None  Antibiotics  :    Anti-infectives (From admission, onward)   Start     Dose/Rate Route Frequency Ordered Stop   05/29/19 1000  remdesivir 100 mg in sodium chloride 0.9 % 250 mL IVPB     100 mg 500 mL/hr over 30 Minutes Intravenous Every 24 hours 05/28/19 0809 06/01/19 0928   05/28/19 0900  remdesivir 200 mg in sodium chloride 0.9 % 250 mL IVPB     200 mg 500 mL/hr over 30 Minutes Intravenous Once 05/28/19 0809 05/28/19 1210     DVT Prophylaxis  :  Lovenox  Inpatient Medications  Scheduled Meds: . albuterol  2  puff Inhalation Q6H  . amLODipine  10 mg Oral Daily  . aspirin EC  81 mg Oral Daily  . benzonatate  200 mg Oral TID  . chlorpheniramine-HYDROcodone  5 mL Oral Q12H  . cholecalciferol  1,000 Units Oral Daily  . dexamethasone  6 mg Oral Q24H  . enoxaparin (LOVENOX) injection  40 mg Subcutaneous Q24H  . mometasone-formoterol  2 puff Inhalation BID  . pantoprazole  40 mg Oral Daily  . vitamin C  500 mg Oral Daily  . zinc sulfate  220 mg Oral Daily   Continuous Infusions: . sodium chloride Stopped (05/31/19 0947)   PRN Meds:.sodium chloride, acetaminophen, ALPRAZolam, guaiFENesin-dextromethorphan, [DISCONTINUED] ondansetron **OR** ondansetron (ZOFRAN) IV, sodium chloride, sodium chloride flush   Time Spent in minutes  25   See all Orders from today for further details   Prashant Singh M.D on 06/05/2019 at 8:56 AM  To page go to [email or web address] - use universal password  [facility] -  Office  [phone]    Objective:   Vitals:   06/04/19 1614 06/04/19 1947 06/05/19 0418 06/05/19 0724  BP: (!) 144/73  (!) 146/70 (!) 143/79  Pulse: 79  68 69  Resp: 16 20 18   Temp: 98.2 F (36.8 C) 98.3 F (36.8 C) 97.6 F (36.4 C) 97.9 F (36.6 C)  TempSrc: Oral Oral Oral Oral  SpO2: 95% 93%  92%  Weight:      Height:        Wt Readings from Last 3 Encounters:  05/28/19 72.4 kg  05/27/19 72.6 kg  03/15/19 73.5 kg     Intake/Output Summary (Last 24 hours) at 06/05/2019 0856 Last data filed at 06/04/2019 2200 Gross   per 24 hour  Intake 480 ml  Output -  Net 480 ml     Physical Exam  Awake Alert, No new F.N deficits, Normal affect [place].AT,PERRAL Supple Neck,No JVD, No cervical lymphadenopathy appriciated.  Symmetrical Chest wall movement, Good air movement bilaterally, CTAB RRR,No Gallops, Rubs or new Murmurs, No Parasternal Heave +ve B.Sounds, Abd Soft, No tenderness, No organomegaly appriciated, No rebound - guarding or rigidity. No Cyanosis, Clubbing or edema, No  new Rash or bruise     Data Review:    CBC Recent Labs  Lab 05/30/19 0345 05/31/19 0340 06/01/19 0135 06/03/19 0420 06/05/19 0500  WBC 8.8 8.7 8.4 11.5* 12.3*  HGB 14.5 15.5 15.4 15.3 16.9  HCT 44.8 48.2 47.6 47.1 53.3*  PLT 139* 143* 154 199 217  MCV 89.1 88.4 87.8 87.9 89.0  MCH 28.8 28.4 28.4 28.5 28.2  MCHC 32.4 32.2 32.4 32.5 31.7  RDW 13.6 13.6 13.6 13.6 14.2  LYMPHSABS 0.6* 0.7 0.8  --  0.6*  MONOABS 0.7 0.9 0.7  --  0.7  EOSABS 0.0 0.0 0.0  --  0.0  BASOSABS 0.0 0.0 0.0  --  0.1    Chemistries  Recent Labs  Lab 05/30/19 0345 05/31/19 0340 06/01/19 0135 06/02/19 0025 06/03/19 0420 06/04/19 0339 06/05/19 0500  NA 139 137 137 138 138 138 137  K 4.1 4.3 4.2 3.8 3.9 4.5 4.4  CL 103 103 103 101 103 100 99  CO2 _0 GLUCOSE 137* 118* 116* 186* 144* 130* 134*  BUN 28* 29* 31* 34* 38* 36* 34*  CREATININE 0.95 0.92 0.92 0.99 1.01 0.99 0.94  CALCIUM 8.3* 8.3* 8.3* 8.2* 8.1* 8.2* 8.2*  MG 1.9 1.9 1.9  --   --   --   --   AST 92* 79* 76* 62* 44* 45* 57*  ALT 82* 85* 99* 95* 83* 83* 99*  ALKPHOS 57 63 67 70 65 66 71  BILITOT 0.3 0.2* 0.5 0.7 0.5 0.5 0.7   ------------------------------------------------------------------------------------------------------------------ No results for input(s): CHOL, HDL, LDLCALC, TRIG, CHOLHDL, LDLDIRECT in the last 72 hours.  No results found for: HGBA1C ------------------------------------------------------------------------------------------------------------------ No results for input(s): TSH, T4TOTAL, T3FREE, THYROIDAB in the last 72 hours.  Invalid input(s): FREET3 ------------------------------------------------------------------------------------------------------------------ Recent Labs    06/03/19 0420 06/04/19 0339  FERRITIN 61 61    Coagulation profile No results for input(s): INR, PROTIME in the last 168 hours.  Recent Labs    06/03/19 0420 06/04/19 0339  DDIMER 1.00* 1.05*     Cardiac Enzymes No results for input(s): CKMB, TROPONINI, MYOGLOBIN in the last 168 hours.  Invalid input(s): CK ------------------------------------------------------------------------------------------------------------------    Component Value Date/Time   BNP 33.7 06/04/2019 9678    Micro Results Recent Results (from the past 240 hour(s))  Blood Culture (routine x 2)     Status: None   Collection Time: 05/27/19 11:54 PM   Specimen: BLOOD  Result Value Ref Range Status   Specimen Description BLOOD RIGHT ANTECUBITAL  Final   Special Requests   Final    BOTTLES DRAWN AEROBIC AND ANAEROBIC Blood Culture adequate volume   Culture   Final    NO GROWTH 5 DAYS Performed at [facility], [address], [place], [place] [address]    Report Status 06/02/2019 FINAL  Final  Blood Culture (routine x 2)     Status: None   Collection Time: 05/27/19 11:54 PM   Specimen: BLOOD  Result Value Ref Range Status  Specimen Description BLOOD RIGHT HAND  Final   Special Requests   Final    BOTTLES DRAWN AEROBIC AND ANAEROBIC Blood Culture adequate volume   Culture   Final    NO GROWTH 5 DAYS Performed at [facility], [address] [place], [place] [address]    Report Status 06/02/2019 FINAL  Final    Radiology Reports Dg Chest 2 View  Result Date: 05/27/2019 CLINICAL DATA:  Cough, covid EXAM: CHEST - 2 VIEW COMPARISON:  October 04, 2017 FINDINGS: The heart size and mediastinal contours are within normal limits. There is mildly increased hazy airspace opacity seen within the periphery of the right upper lung and the left lower lung. Aortic knob calcifications are seen. No acute osseous abnormality. IMPRESSION: Hazy peripheral airspace opacity is within both lungs. The findings in the lungs are nonspecific, but concerning for atypical infection, which includes viral pneumonia. Electronically Signed   By: Bindu  Avutu M.D.   On: 05/27/2019 21:23   Dg Chest Port 1v  Same Day  Result Date: 06/02/2019 CLINICAL DATA:  Shortness of breath EXAM: PORTABLE CHEST 1 VIEW COMPARISON:  Six days ago FINDINGS: Patchy bilateral pneumonia with mild increase. Normal heart size accounting for elevated right diaphragm and technique. No edema, effusion, or pneumothorax IMPRESSION: COVID-19 with patchy bilateral pneumonia that is more conspicuous than on prior. Electronically Signed   By: Jonathon  Watts M.D.   On: 06/02/2019 08:55    

## 2019-06-05 NOTE — Plan of Care (Signed)
Patient medically stable to DC but complications relating to home living situation remain a barrier. Otherwise, patient is on RA and had no acute changes to clinical status this shift.   Problem: Education: Goal: Knowledge of General Education information will improve Description: Including pain rating scale, medication(s)/side effects and non-pharmacologic comfort measures Outcome: Progressing   Problem: Health Behavior/Discharge Planning: Goal: Ability to manage health-related needs will improve Outcome: Progressing   Problem: Clinical Measurements: Goal: Ability to maintain clinical measurements within normal limits will improve Outcome: Progressing Goal: Will remain free from infection Outcome: Progressing Goal: Diagnostic test results will improve Outcome: Progressing Goal: Respiratory complications will improve Outcome: Progressing Goal: Cardiovascular complication will be avoided Outcome: Progressing   Problem: Activity: Goal: Risk for activity intolerance will decrease Outcome: Progressing   Problem: Nutrition: Goal: Adequate nutrition will be maintained Outcome: Progressing   Problem: Coping: Goal: Level of anxiety will decrease Outcome: Progressing   Problem: Elimination: Goal: Will not experience complications related to bowel motility Outcome: Progressing Goal: Will not experience complications related to urinary retention Outcome: Progressing   Problem: Pain Managment: Goal: General experience of comfort will improve Outcome: Progressing   Problem: Safety: Goal: Ability to remain free from injury will improve Outcome: Progressing   Problem: Skin Integrity: Goal: Risk for impaired skin integrity will decrease Outcome: Progressing

## 2019-06-06 DIAGNOSIS — J1289 Other viral pneumonia: Secondary | ICD-10-CM | POA: Diagnosis not present

## 2019-06-06 DIAGNOSIS — U071 COVID-19: Secondary | ICD-10-CM | POA: Diagnosis not present

## 2019-06-06 LAB — COMPREHENSIVE METABOLIC PANEL
ALT: 85 U/L — ABNORMAL HIGH (ref 0–44)
AST: 45 U/L — ABNORMAL HIGH (ref 15–41)
Albumin: 2.6 g/dL — ABNORMAL LOW (ref 3.5–5.0)
Alkaline Phosphatase: 63 U/L (ref 38–126)
Anion gap: 9 (ref 5–15)
BUN: 31 mg/dL — ABNORMAL HIGH (ref 8–23)
CO2: 25 mmol/L (ref 22–32)
Calcium: 8 mg/dL — ABNORMAL LOW (ref 8.9–10.3)
Chloride: 101 mmol/L (ref 98–111)
Creatinine, Ser: 0.97 mg/dL (ref 0.61–1.24)
GFR calc Af Amer: >60 mL/min (ref >60)
GFR calc non Af Amer: >60 mL/min (ref >60)
Glucose, Bld: 139 mg/dL — ABNORMAL HIGH (ref 70–99)
Potassium: 4.9 mmol/L (ref 3.5–5.1)
Sodium: 135 mmol/L (ref 135–145)
Total Bilirubin: 0.7 mg/dL (ref 0.3–1.2)
Total Protein: 5.4 g/dL — ABNORMAL LOW (ref 6.5–8.1)

## 2019-06-06 LAB — CBC WITH DIFFERENTIAL/PLATELET
Abs Immature Granulocytes: 0.54 10*3/uL — ABNORMAL HIGH (ref 0.00–0.07)
Basophils Absolute: 0 10*3/uL (ref 0.0–0.1)
Basophils Relative: 0 %
Eosinophils Absolute: 0 10*3/uL (ref 0.0–0.5)
Eosinophils Relative: 0 %
HCT: 47 % (ref 39.0–52.0)
Hemoglobin: 15.1 g/dL (ref 13.0–17.0)
Immature Granulocytes: 5 %
Lymphocytes Relative: 4 %
Lymphs Abs: 0.4 10*3/uL — ABNORMAL LOW (ref 0.7–4.0)
MCH: 28.3 pg (ref 26.0–34.0)
MCHC: 32.1 g/dL (ref 30.0–36.0)
MCV: 88 fL (ref 80.0–100.0)
Monocytes Absolute: 0.6 10*3/uL (ref 0.1–1.0)
Monocytes Relative: 6 %
Neutro Abs: 9.3 10*3/uL — ABNORMAL HIGH (ref 1.7–7.7)
Neutrophils Relative %: 85 %
Platelets: 199 10*3/uL (ref 150–400)
RBC: 5.34 MIL/uL (ref 4.22–5.81)
RDW: 14 % (ref 11.5–15.5)
WBC: 10.9 10*3/uL — ABNORMAL HIGH (ref 4.0–10.5)
nRBC: 0.2 % (ref 0.0–0.2)

## 2019-06-06 LAB — C-REACTIVE PROTEIN: CRP: <0.8 mg/dL (ref <1.0)

## 2019-06-06 MED ORDER — DEXAMETHASONE 4 MG PO TABS
2.0000 mg | ORAL_TABLET | ORAL | Status: AC
  Administered 2019-06-06: 2 mg via ORAL
  Filled 2019-06-06: qty 1

## 2019-06-06 MED ORDER — GUAIFENESIN-DM 100-10 MG/5ML PO SYRP: 10.0000 mL | ORAL_SOLUTION | Freq: Four times a day (QID) | ORAL | 0 refills | Status: DC | PRN

## 2019-06-06 MED ORDER — FUROSEMIDE 10 MG/ML IJ SOLN
20.0000 mg | Freq: Once | INTRAMUSCULAR | Status: AC
  Administered 2019-06-06: 20 mg via INTRAVENOUS
  Filled 2019-06-06: qty 2

## 2019-06-06 NOTE — Evaluation (Signed)
Occupational Therapy Evaluation Patient Details Name: Alejandro Hicks MRN: [id number] DOB: 1934/08/01 Today's Date: 06/06/2019    History of Present Illness 85 year old Caucasian male with history of hypertension, asthma, dyslipidemia who went to [facility] with family and friends on a vacation and came back with some cough and shortness of breath which started around 3 days ago, he was then checked for COVID-19 and was +3 days ago.  He developed mild nausea vomiting and diarrhea but subsequently developed some exertional shortness of breath and was then admitted to the hospital   Clinical Impression   Pt admitted with above diagnoses, presenting with BADL deficits 2/2 cardiopulmonary status. PTA pt ind, lived with dtr. Dtr is currently admitted for COVID as well. Pt anticipates d/c this date and has concerns managing IADL. Educated on med management strategies and compensatory measures for memory. Also educated on how to turn on/off home O2 tank. Discussed ECS strategies, IS, and flutter. Pt appears at baseline with BADLs. Do not anticipate need of f/u OT but recommend supervision for meds/O2 usage if dtr is not home. Will continue to follow acutely per POC listed below.     Follow Up Recommendations  Supervision - Intermittent;Other (comment)(HHRN/ supervision for new meds and O2 use)    Equipment Recommendations  3 in 1 bedside commode    Recommendations for Other Services       Precautions / Restrictions Precautions Precautions: Fall Precaution Comments: monitor sats, now tolerates less ambulation distance and is  miore SOB and desats Restrictions Weight Bearing Restrictions: No      Mobility Bed Mobility               General bed mobility comments: up in chair  Transfers Overall transfer level: Independent               General transfer comment: no assistance for sit <> Stand    Balance                                           ADL  either performed or assessed with clinical judgement   ADL Overall ADL's : Needs assistance/impaired Eating/Feeding: Set up;Sitting   Grooming: Set up;Sitting;Standing   Upper Body Bathing: Set up;Sitting   Lower Body Bathing: Set up;Sitting/lateral leans;Sit to/from stand   Upper Body Dressing : Set up;Sitting   Lower Body Dressing: Set up;Sit to/from stand;Sitting/lateral leans   Toilet Transfer: Set up;Regular Toilet   Toileting- Clothing Manipulation and Hygiene: Set up;Sit to/from stand;Sitting/lateral lean   Tub/ Shower Transfer: Set up;3 in 1;Ambulation   Functional mobility during ADLs: Min guard General ADL Comments: pt ltd by cardiopulmonary status     Vision   Vision Assessment?: No apparent visual deficits     Perception     Praxis      Pertinent Vitals/Pain Pain Assessment: No/denies pain     Hand Dominance     Extremity/Trunk Assessment Upper Extremity Assessment Upper Extremity Assessment: Overall WFL for tasks assessed   Lower Extremity Assessment Lower Extremity Assessment: Defer to PT evaluation       Communication Communication Communication: No difficulties   Cognition Arousal/Alertness: Awake/alert Behavior During Therapy: WFL for tasks assessed/performed;Flat affect Overall Cognitive Status: Within Functional Limits for tasks assessed  General Comments: some occasional instances of poor recall, do not expect worse than baseline   General Comments       Exercises     Shoulder Instructions      Home Living Family/patient expects to be discharged to:: Private residence Living Arrangements: Children(dtr/son in law) Available Help at Discharge: Family Type of Home: House Home Access: Stairs to enter Secretary/administrator of Steps: 1   Home Layout: Two level;Bed/bath upstairs Alternate Level Stairs-Number of Steps: 17 Alternate Level Stairs-Rails: Right;Left     Bathroom Toilet:  Standard     Home Equipment: None          Prior Functioning/Environment Level of Independence: Independent                 OT Problem List: Decreased strength;Decreased knowledge of use of DME or AE;Decreased activity tolerance;Cardiopulmonary status limiting activity      OT Treatment/Interventions:      OT Goals(Current goals can be found in the care plan section) Acute Rehab OT Goals Patient Stated Goal: go home safely OT Goal Formulation: With patient Time For Goal Achievement: 06/20/19 Potential to Achieve Goals: Good  OT Frequency:     Barriers to D/C:            Co-evaluation              AM-PAC OT "6 Clicks" Daily Activity     Outcome Measure Help from another person eating meals?: A Little Help from another person taking care of personal grooming?: A Little Help from another person toileting, which includes using toliet, bedpan, or urinal?: None Help from another person bathing (including washing, rinsing, drying)?: A Little Help from another person to put on and taking off regular upper body clothing?: None Help from another person to put on and taking off regular lower body clothing?: None 6 Click Score: 21   End of Session Equipment Utilized During Treatment: Oxygen Nurse Communication: Mobility status  Activity Tolerance: Patient tolerated treatment well Patient left: in chair;with call bell/phone within reach  OT Visit Diagnosis: Muscle weakness (generalized) (M62.81)                Time: 8546-2703 OT Time Calculation (min): 13 min Charges:  OT General Charges $OT Visit: 1 Visit OT Evaluation $OT Eval Low Complexity: 1 Low   Dalphine Handing, MSOT, OTR/L Behavioral Health OT/ Acute Relief OT [facility] OT Phone: [phone]    Dalphine Handing 06/06/2019, 9:51 AM

## 2019-06-06 NOTE — Discharge Instructions (Signed)
Follow with Primary MD Wenda Low, MD in 7 days   Get CBC, CMP, 2 view Chest X ray -  checked next visit within 1 week by Primary MD    Activity: As tolerated with Full fall precautions use walker/cane & assistance as needed  Disposition Home   Diet: Heart Healthy   Special Instructions: If you have smoked or chewed Tobacco  in the last 2 yrs please stop smoking, stop any regular Alcohol  and or any Recreational drug use.  On your next visit with your primary care physician please Get Medicines reviewed and adjusted.  Please request your Prim.MD to go over all Hospital Tests and Procedure/Radiological results at the follow up, please get all Hospital records sent to your Prim MD by signing hospital release before you go home.  If you experience worsening of your admission symptoms, develop shortness of breath, life threatening emergency, suicidal or homicidal thoughts you must seek medical attention immediately by calling 911 or calling your MD immediately  if symptoms less severe.  You Must read complete instructions/literature along with all the possible adverse reactions/side effects for all the Medicines you take and that have been prescribed to you. Take any new Medicines after you have completely understood and accpet all the possible adverse reactions/side effects.       Person Under Monitoring Name: Alejandro Hicks  Location: [place]  [address]   Infection Prevention Recommendations for Individuals Confirmed to have, or Being Evaluated for, 2019 Novel Coronavirus (COVID-19) Infection Who Receive Care at Home  Individuals who are confirmed to have, or are being evaluated for, COVID-19 should follow the prevention steps below until a healthcare provider or local or state health department says they can return to normal activities.  Stay home except to get medical care You should restrict activities outside your home, except for getting medical care. Do  not go to work, school, or public areas, and do not use public transportation or taxis.  Call ahead before visiting your doctor Before your medical appointment, call the healthcare provider and tell them that you have, or are being evaluated for, COVID-19 infection. This will help the healthcare providers office take steps to keep other people from getting infected. Ask your healthcare provider to call the local or state health department.  Monitor your symptoms Seek prompt medical attention if your illness is worsening (e.g., difficulty breathing). Before going to your medical appointment, call the healthcare provider and tell them that you have, or are being evaluated for, COVID-19 infection. Ask your healthcare provider to call the local or state health department.  Wear a facemask You should wear a facemask that covers your nose and mouth when you are in the same room with other people and when you visit a healthcare provider. People who live with or visit you should also wear a facemask while they are in the same room with you.  Separate yourself from other people in your home As much as possible, you should stay in a different room from other people in your home. Also, you should use a separate bathroom, if available.  Avoid sharing household items You should not share dishes, drinking glasses, cups, eating utensils, towels, bedding, or other items with other people in your home. After using these items, you should wash them thoroughly with soap and water.  Cover your coughs and sneezes Cover your mouth and nose with a tissue when you cough or sneeze, or you can cough or  sneeze into your sleeve. Throw used tissues in a lined trash can, and immediately wash your hands with soap and water for at least 20 seconds or use an alcohol-based hand rub.  Wash your [facility] your hands often and thoroughly with soap and water for at least 20 seconds. You can use an alcohol-based  hand sanitizer if soap and water are not available and if your hands are not visibly dirty. Avoid touching your eyes, nose, and mouth with unwashed hands.   Prevention Steps for Caregivers and Household Members of Individuals Confirmed to have, or Being Evaluated for, COVID-19 Infection Being Cared for in the Home  If you live with, or provide care at home for, a person confirmed to have, or being evaluated for, COVID-19 infection please follow these guidelines to prevent infection:  Follow healthcare providers instructions Make sure that you understand and can help the patient follow any healthcare provider instructions for all care.  Provide for the patients basic needs You should help the patient with basic needs in the home and provide support for getting groceries, prescriptions, and other personal needs.  Monitor the patients symptoms If they are getting sicker, call his or her medical provider and tell them that the patient has, or is being evaluated for, COVID-19 infection. This will help the healthcare providers office take steps to keep other people from getting infected. Ask the healthcare provider to call the local or state health department.  Limit the number of people who have contact with the patient  If possible, have only one caregiver for the patient.  Other household members should stay in another home or place of residence. If this is not possible, they should stay  in another room, or be separated from the patient as much as possible. Use a separate bathroom, if available.  Restrict visitors who do not have an essential need to be in the home.  Keep older adults, very young children, and other sick people away from the patient Keep older adults, very young children, and those who have compromised immune systems or chronic health conditions away from the patient. This includes people with chronic heart, lung, or kidney conditions, diabetes, and  cancer.  Ensure good ventilation Make sure that shared spaces in the home have good air flow, such as from an air conditioner or an opened window, weather permitting.  Wash your hands often  Wash your hands often and thoroughly with soap and water for at least 20 seconds. You can use an alcohol based hand sanitizer if soap and water are not available and if your hands are not visibly dirty.  Avoid touching your eyes, nose, and mouth with unwashed hands.  Use disposable paper towels to dry your hands. If not available, use dedicated cloth towels and replace them when they become wet.  Wear a facemask and gloves  Wear a disposable facemask at all times in the room and gloves when you touch or have contact with the patients blood, body fluids, and/or secretions or excretions, such as sweat, saliva, sputum, nasal mucus, vomit, urine, or feces.  Ensure the mask fits over your nose and mouth tightly, and do not touch it during use.  Throw out disposable facemasks and gloves after using them. Do not reuse.  Wash your hands immediately after removing your facemask and gloves.  If your personal clothing becomes contaminated, carefully remove clothing and launder. Wash your hands after handling contaminated clothing.  Place all used disposable facemasks, gloves, and other waste  in a lined container before disposing them with other household waste.  Remove gloves and wash your hands immediately after handling these items.  Do not share dishes, glasses, or other household items with the patient  Avoid sharing household items. You should not share dishes, drinking glasses, cups, eating utensils, towels, bedding, or other items with a patient who is confirmed to have, or being evaluated for, COVID-19 infection.  After the person uses these items, you should wash them thoroughly with soap and water.  Wash laundry thoroughly  Immediately remove and wash clothes or bedding that have blood, body  fluids, and/or secretions or excretions, such as sweat, saliva, sputum, nasal mucus, vomit, urine, or feces, on them.  Wear gloves when handling laundry from the patient.  Read and follow directions on labels of laundry or clothing items and detergent. In general, wash and dry with the warmest temperatures recommended on the label.  Clean all areas the individual has used often  Clean all touchable surfaces, such as counters, tabletops, doorknobs, bathroom fixtures, toilets, phones, keyboards, tablets, and bedside tables, every day. Also, clean any surfaces that may have blood, body fluids, and/or secretions or excretions on them.  Wear gloves when cleaning surfaces the patient has come in contact with.  Use a diluted bleach solution (e.g., dilute bleach with 1 part bleach and 10 parts water) or a household disinfectant with a label that says EPA-registered for coronaviruses. To make a bleach solution at home, add 1 tablespoon of bleach to 1 quart (4 cups) of water. For a larger supply, add  cup of bleach to 1 gallon (16 cups) of water.  Read labels of cleaning products and follow recommendations provided on product labels. Labels contain instructions for safe and effective use of the cleaning product including precautions you should take when applying the product, such as wearing gloves or eye protection and making sure you have good ventilation during use of the product.  Remove gloves and wash hands immediately after cleaning.  Monitor yourself for signs and symptoms of illness Caregivers and household members are considered close contacts, should monitor their health, and will be asked to limit movement outside of the home to the extent possible. Follow the monitoring steps for close contacts listed on the symptom monitoring form.   ? If you have additional questions, contact your local health department or call the epidemiologist on call at [phone] (available 24/7). ? This  guidance is subject to change. For the most up-to-date guidance from [facility], please refer to their website: YouBlogs.pl

## 2019-06-06 NOTE — Care Management Important Message (Signed)
Important Message  Patient Details  Name: Alejandro Hicks MRN: [id number] Date of Birth: 08/02/1934   Medicare Important Message Given:  Yes - Important Message mailed due to current National Emergency  Verbal consent obtained due to current National Emergency  Relationship to patient: Child Contact Name: [name] Call Date: 06/06/19  Time: 1433 Phone: [phone] Outcome: No Answer/Busy Important Message mailed to: Patient address on file    Delorse Lek 06/06/2019, 2:34 PM

## 2019-06-06 NOTE — Discharge Summary (Signed)
WEST BOOMERSHINE PYK:[id number] DOB: 1933-12-16 DOA: 05/28/2019  PCP: Georgann Housekeeper, MD  Admit date: 05/28/2019  Discharge date: 06/06/2019  Admitted From: Home  Disposition:  Home   Recommendations for Outpatient Follow-up:   Follow up with PCP in 1-2 weeks  PCP Please obtain BMP/CBC, 2 view CXR in 1week,  (see Discharge instructions)   PCP Please follow up on the following pending results:    Home Health: PT,RN   Equipment/Devices: o2 2lit  Consultations: None  Discharge Condition: Stable    CODE STATUS: Full    Diet Recommendation: Heart Healthy     CC - SOB   Brief history of present illness from the day of admission and additional interim summary    Patient is a 85 y.o. male with PMHx of HTN, dyslipidemia, asthma who went to [facility] with family and friends-and subsequently came back with cough and shortness of breath-subsequently found to have COVID-19-he developed acute hypoxemic respiratory failure secondary to pneumonia.  He was then admitted to the hospitalist service for further treatment and evaluation-see below for further details.                                                                 Hospital Course    Acute Hypoxic Resp Failure due to Covid 19 Viral pneumonia: Overall improved-stable at rest but desaturates with exertion.  Inflammatory markers essentially normal.  Main issue continues to be severe coughing spells.  He has been treated with IV Remdisvir and steroids, at rest he is symptom-free but when he walks he still gets little hypoxic.  Was given a dose of Lasix IV on 06/03/2019.  Today he is having breakfast in chair and feels symptom-free except for mild intermittent cough, he wants to go home and will be discharged home with PT RN along with oxygen as needed as needed when he  ambulates likely may require oxygen on exertion for the next 1 to 2 weeks.    COVID-19 Labs  Recent Labs    06/04/19 0339 06/05/19 0500 06/06/19 0130  DDIMER 1.05*  --   --   FERRITIN 61  --   --   CRP 0.8 <0.8 <0.8     Transaminitis: Secondary to COVID-19-improving-follow periodically.  Trend is stable.  Hypertension: Controlled-continue amlodipine  GERD: Continue PPI  Peripheral neuropathy: Continue Neurontin    Discharge diagnosis     Principal Problem:   Pneumonia due to 2019-nCoV Active Problems:   Essential hypertension, benign    Discharge instructions    Discharge Instructions    Diet - low sodium heart healthy   Complete by: As directed    Discharge instructions   Complete by: As directed    Follow with Primary MD Georgann Housekeeper, MD in 7 days   Get CBC, CMP, 2  view Chest X ray -  checked next visit within 1 week by Primary MD    Activity: As tolerated with Full fall precautions use walker/cane & assistance as needed  Disposition Home   Diet: Heart Healthy   Special Instructions: If you have smoked or chewed Tobacco  in the last 2 yrs please stop smoking, stop any regular Alcohol  and or any Recreational drug use.  On your next visit with your primary care physician please Get Medicines reviewed and adjusted.  Please request your Prim.MD to go over all Hospital Tests and Procedure/Radiological results at the follow up, please get all Hospital records sent to your Prim MD by signing hospital release before you go home.  If you experience worsening of your admission symptoms, develop shortness of breath, life threatening emergency, suicidal or homicidal thoughts you must seek medical attention immediately by calling 911 or calling your MD immediately  if symptoms less severe.  You Must read complete instructions/literature along with all the possible adverse reactions/side effects for all the Medicines you take and that have been prescribed to  you. Take any new Medicines after you have completely understood and accpet all the possible adverse reactions/side effects.   Increase activity slowly   Complete by: As directed    MyChart COVID-19 home monitoring program   Complete by: Jun 06, 2019    Is the patient willing to use the [facility] for home monitoring?: Yes   Temperature monitoring   Complete by: Jun 06, 2019    After how many days would you like to receive a notification of this patient's flowsheet entries?: 1      Discharge Medications   Allergies as of 06/06/2019      Reactions   Lipitor [atorvastatin]    Myalgia   Zocor [simvastatin]    Myalgia      Medication List    STOP taking these medications   celecoxib 200 MG capsule Commonly known as: CeleBREX     TAKE these medications   AeroChamber Plus Flo-Vu Large Misc 1 each by Other route once.   albuterol 108 (90 Base) MCG/ACT inhaler Commonly known as: ProAir HFA Use 2 puffs every 4 hours as needed for cough and wheeze. May use 10-20 minutes before exercise   ALPRAZolam 0.25 MG tablet Commonly known as: XANAX Take 0.25 mg by mouth 2 (two) times daily as needed for anxiety.   azelastine 0.1 % nasal spray Commonly known as: ASTELIN PLACE 1 SPRAY INTO BOTH NOSTRILS 2 (TWO) TIMES DAILY.   Dexilant 60 MG capsule Generic drug: dexlansoprazole TAKE 1 CAPSULE (60 MG TOTAL) BY MOUTH EVERY MORNING. What changed: See the new instructions.   DHEA PO Take by mouth daily.   fluticasone 50 MCG/ACT nasal spray Commonly known as: FLONASE Place 1 spray into both nostrils 2 (two) times daily.   gabapentin 300 MG capsule Commonly known as: NEURONTIN One tab PO qHS for a week, then BID for a week, then TID. May double weekly to a max of 3,600mg /day   guaiFENesin-dextromethorphan 100-10 MG/5ML syrup Commonly known as: ROBITUSSIN DM Take 10 mLs by mouth every 6 (six) hours as needed for cough.   Pregnenolone Micronized Powd Use as directed in the  mouth or throat daily.   tadalafil 20 MG tablet Commonly known as: CIALIS Take 5 mg by mouth daily as needed for erectile dysfunction.   testosterone cypionate 200 MG/ML injection Commonly known as: DEPOTESTOSTERONE CYPIONATE Inject 0.2 mg into the muscle once a  week.   valsartan-hydrochlorothiazide 320-25 MG tablet Commonly known as: DIOVAN-HCT Take 1 tablet by mouth daily.   Vitamin D (Cholecalciferol) 25 MCG (1000 UT) Tabs Take by mouth daily.   VITAMIN K2 PO Take by mouth daily.            Durable Medical Equipment  (From admission, onward)         Start     Ordered   06/04/19 0917  For home use only DME oxygen  Once    Question Answer Comment  Length of Need 6 Months   Mode or (Route) Nasal cannula   Liters per Minute 2   Frequency Continuous (stationary and portable oxygen unit needed)   Oxygen conserving device Yes   Oxygen delivery system Gas      06/04/19 0916          Follow-up Information    [facility] Follow up.   Why: A representative from [facility] will deliver oxygen concentrator and tanks to your home. Should you have any concerns or problems with tanks, please call the number listed for [facility]. Contact information: [address] [place] [address] [phone]        Georgann HousekeeperHusain, Karrar, MD. Schedule an appointment as soon as possible for a visit in 1 week(s).   Specialty: Internal Medicine Contact information: [address] [place] [address] [phone]           Major procedures and Radiology Reports - PLEASE review detailed and final reports thoroughly  -         Dg Chest 2 View  Result Date: 05/27/2019 CLINICAL DATA:  Cough, covid EXAM: CHEST - 2 VIEW COMPARISON:  October 04, 2017 FINDINGS: The heart size and mediastinal contours are within normal limits. There is mildly increased hazy airspace opacity seen within the periphery of the right upper lung and the left lower lung.  Aortic knob calcifications are seen. No acute osseous abnormality. IMPRESSION: Hazy peripheral airspace opacity is within both lungs. The findings in the lungs are nonspecific, but concerning for atypical infection, which includes viral pneumonia. Electronically Signed   By: Jonna ClarkBindu  Avutu M.D.   On: 05/27/2019 21:23   Dg Chest Port 1v Same Day  Result Date: 06/02/2019 CLINICAL DATA:  Shortness of breath EXAM: PORTABLE CHEST 1 VIEW COMPARISON:  Six days ago FINDINGS: Patchy bilateral pneumonia with mild increase. Normal heart size accounting for elevated right diaphragm and technique. No edema, effusion, or pneumothorax IMPRESSION: COVID-19 with patchy bilateral pneumonia that is more conspicuous than on prior. Electronically Signed   By: Marnee SpringJonathon  Watts M.D.   On: 06/02/2019 08:55    Micro Results     Recent Results (from the past 240 hour(s))  Blood Culture (routine x 2)     Status: None   Collection Time: 05/27/19 11:54 PM   Specimen: BLOOD  Result Value Ref Range Status   Specimen Description BLOOD RIGHT ANTECUBITAL  Final   Special Requests   Final    BOTTLES DRAWN AEROBIC AND ANAEROBIC Blood Culture adequate volume   Culture   Final    NO GROWTH 5 DAYS Performed at [facility], [address] [place], [place] [address]    Report Status 06/02/2019 FINAL  Final  Blood Culture (routine x 2)     Status: None   Collection Time: 05/27/19 11:54 PM   Specimen: BLOOD  Result Value Ref Range Status   Specimen Description BLOOD RIGHT HAND  Final  Special Requests   Final    BOTTLES DRAWN AEROBIC AND ANAEROBIC Blood Culture adequate volume   Culture   Final    NO GROWTH 5 DAYS Performed at [facility], [address] [place] [place], [place] [address]    Report Status 06/02/2019 FINAL  Final    Today   Subjective    Santo Zahradnik today has no headache,no chest abdominal pain,no new weakness tingling or numbness, feels much better wants to go home today.    Objective    Blood pressure 129/77, pulse 65, temperature 98 F (36.7 C), temperature source Oral, resp. rate 20, height  (1.676 m), weight 72.4 kg, SpO2 92 %.   Intake/Output Summary (Last 24 hours) at 06/06/2019 0931 Last data filed at 06/06/2019 0400 Gross per 24 hour  Intake 960 ml  Output 450 ml  Net 510 ml    Exam  Awake Alert, Oriented x 3, No new F.N deficits, Normal affect [place].AT,PERRAL Supple Neck,No JVD, No cervical lymphadenopathy appriciated.  Symmetrical Chest wall movement, Good air movement bilaterally, CTAB RRR,No Gallops,Rubs or new Murmurs, No Parasternal Heave +ve B.Sounds, Abd Soft, Non tender, No organomegaly appriciated, No rebound -guarding or rigidity. No Cyanosis, Clubbing or edema, No new Rash or bruise   Data Review   CBC w Diff:  Lab Results  Component Value Date   WBC 10.9 (H) 06/06/2019   HGB 15.1 06/06/2019   HGB 15.4 09/29/2014   HCT 47.0 06/06/2019   HCT 46.3 09/29/2014   PLT 199 06/06/2019   PLT 138 (L) 09/29/2014   LYMPHOPCT 4 06/06/2019   LYMPHOPCT 16.1 09/29/2014   MONOPCT 6 06/06/2019   MONOPCT 9.2 09/29/2014   EOSPCT 0 06/06/2019   EOSPCT 3.7 09/29/2014   BASOPCT 0 06/06/2019   BASOPCT 0.5 09/29/2014    CMP:  Lab Results  Component Value Date   NA 135 06/06/2019   K 4.9 06/06/2019   CL 101 06/06/2019   CO2 25 06/06/2019   BUN 31 (H) 06/06/2019   CREATININE 0.97 06/06/2019   PROT 5.4 (L) 06/06/2019   ALBUMIN 2.6 (L) 06/06/2019   BILITOT 0.7 06/06/2019   ALKPHOS 63 06/06/2019   AST 45 (H) 06/06/2019   ALT 85 (H) 06/06/2019  .   Total Time in preparing paper work, data evaluation and todays exam - 35 minutes  Susa Raring M.D on 06/06/2019 at 9:31 AM  [facility]   Office  [phone]

## 2019-06-06 NOTE — Progress Notes (Signed)
PT Note- Checked with patient about practicing steps prior to DC. Patient declines. Patient expresses concern to be able to care for  Self( shots, meds) and he is concerned for daughter DC today also.  Patient encouraged to walk short distances frequently, take  His time to get up to his B/b. Tresa Endo PT [place] Pager [phone] Office [phone]

## 2019-06-09 ENCOUNTER — Inpatient Hospital Stay (HOSPITAL_COMMUNITY)
Admission: EM | Admit: 2019-06-09 | Discharge: 2019-06-22 | DRG: 177 | Disposition: A | Discharge status: Skilled Nursing Facility | Payer: Medicare Other | Attending: Family Medicine | Admitting: Family Medicine

## 2019-06-09 ENCOUNTER — Other Ambulatory Visit: Payer: Self-pay

## 2019-06-09 ENCOUNTER — Encounter (HOSPITAL_COMMUNITY): Payer: Self-pay | Admitting: Emergency Medicine

## 2019-06-09 ENCOUNTER — Inpatient Hospital Stay (HOSPITAL_COMMUNITY): Payer: Medicare Other

## 2019-06-09 ENCOUNTER — Emergency Department (HOSPITAL_COMMUNITY): Payer: Medicare Other

## 2019-06-09 DIAGNOSIS — M5030 Other cervical disc degeneration, unspecified cervical region: Secondary | ICD-10-CM | POA: Diagnosis present

## 2019-06-09 DIAGNOSIS — L719 Rosacea, unspecified: Secondary | ICD-10-CM | POA: Diagnosis present

## 2019-06-09 DIAGNOSIS — J9621 Acute and chronic respiratory failure with hypoxia: Secondary | ICD-10-CM | POA: Diagnosis present

## 2019-06-09 DIAGNOSIS — Z79899 Other long term (current) drug therapy: Secondary | ICD-10-CM | POA: Diagnosis not present

## 2019-06-09 DIAGNOSIS — J45909 Unspecified asthma, uncomplicated: Secondary | ICD-10-CM | POA: Diagnosis present

## 2019-06-09 DIAGNOSIS — M1611 Unilateral primary osteoarthritis, right hip: Secondary | ICD-10-CM | POA: Diagnosis present

## 2019-06-09 DIAGNOSIS — E785 Hyperlipidemia, unspecified: Secondary | ICD-10-CM | POA: Diagnosis present

## 2019-06-09 DIAGNOSIS — U071 COVID-19: Principal | ICD-10-CM | POA: Diagnosis present

## 2019-06-09 DIAGNOSIS — K219 Gastro-esophageal reflux disease without esophagitis: Secondary | ICD-10-CM | POA: Diagnosis present

## 2019-06-09 DIAGNOSIS — M5136 Other intervertebral disc degeneration, lumbar region: Secondary | ICD-10-CM | POA: Diagnosis present

## 2019-06-09 DIAGNOSIS — J9601 Acute respiratory failure with hypoxia: Secondary | ICD-10-CM | POA: Diagnosis not present

## 2019-06-09 DIAGNOSIS — F419 Anxiety disorder, unspecified: Secondary | ICD-10-CM | POA: Diagnosis present

## 2019-06-09 DIAGNOSIS — N400 Enlarged prostate without lower urinary tract symptoms: Secondary | ICD-10-CM | POA: Diagnosis present

## 2019-06-09 DIAGNOSIS — Z8719 Personal history of other diseases of the digestive system: Secondary | ICD-10-CM | POA: Diagnosis not present

## 2019-06-09 DIAGNOSIS — B379 Candidiasis, unspecified: Secondary | ICD-10-CM | POA: Diagnosis present

## 2019-06-09 DIAGNOSIS — Z87891 Personal history of nicotine dependence: Secondary | ICD-10-CM | POA: Diagnosis not present

## 2019-06-09 DIAGNOSIS — J1289 Other viral pneumonia: Secondary | ICD-10-CM | POA: Diagnosis present

## 2019-06-09 DIAGNOSIS — I10 Essential (primary) hypertension: Secondary | ICD-10-CM | POA: Diagnosis present

## 2019-06-09 DIAGNOSIS — Z888 Allergy status to other drugs, medicaments and biological substances status: Secondary | ICD-10-CM | POA: Diagnosis not present

## 2019-06-09 DIAGNOSIS — Z9981 Dependence on supplemental oxygen: Secondary | ICD-10-CM

## 2019-06-09 DIAGNOSIS — R7989 Other specified abnormal findings of blood chemistry: Secondary | ICD-10-CM | POA: Diagnosis not present

## 2019-06-09 DIAGNOSIS — J1282 Pneumonia due to coronavirus disease 2019: Secondary | ICD-10-CM

## 2019-06-09 DIAGNOSIS — J181 Lobar pneumonia, unspecified organism: Secondary | ICD-10-CM | POA: Diagnosis not present

## 2019-06-09 DIAGNOSIS — G4733 Obstructive sleep apnea (adult) (pediatric): Secondary | ICD-10-CM | POA: Diagnosis present

## 2019-06-09 DIAGNOSIS — J159 Unspecified bacterial pneumonia: Secondary | ICD-10-CM | POA: Diagnosis not present

## 2019-06-09 DIAGNOSIS — R748 Abnormal levels of other serum enzymes: Secondary | ICD-10-CM | POA: Diagnosis present

## 2019-06-09 DIAGNOSIS — T380X5A Adverse effect of glucocorticoids and synthetic analogues, initial encounter: Secondary | ICD-10-CM | POA: Diagnosis present

## 2019-06-09 DIAGNOSIS — J189 Pneumonia, unspecified organism: Secondary | ICD-10-CM | POA: Diagnosis not present

## 2019-06-09 LAB — CBC WITH DIFFERENTIAL/PLATELET
Abs Immature Granulocytes: 0.1 10*3/uL — ABNORMAL HIGH (ref 0.00–0.07)
Basophils Absolute: 0 10*3/uL (ref 0.0–0.1)
Basophils Relative: 0 %
Eosinophils Absolute: 0.2 10*3/uL (ref 0.0–0.5)
Eosinophils Relative: 1 %
HCT: 50.6 % (ref 39.0–52.0)
Hemoglobin: 16.6 g/dL (ref 13.0–17.0)
Immature Granulocytes: 1 %
Lymphocytes Relative: 3 %
Lymphs Abs: 0.4 10*3/uL — ABNORMAL LOW (ref 0.7–4.0)
MCH: 28.9 pg (ref 26.0–34.0)
MCHC: 32.8 g/dL (ref 30.0–36.0)
MCV: 88.2 fL (ref 80.0–100.0)
Monocytes Absolute: 0.8 10*3/uL (ref 0.1–1.0)
Monocytes Relative: 6 %
Neutro Abs: 12.7 10*3/uL — ABNORMAL HIGH (ref 1.7–7.7)
Neutrophils Relative %: 89 %
Platelets: 161 10*3/uL (ref 150–400)
RBC: 5.74 MIL/uL (ref 4.22–5.81)
RDW: 13.7 % (ref 11.5–15.5)
WBC: 14.2 10*3/uL — ABNORMAL HIGH (ref 4.0–10.5)
nRBC: 0 % (ref 0.0–0.2)

## 2019-06-09 LAB — CREATININE, SERUM
Creatinine, Ser: 0.96 mg/dL (ref 0.61–1.24)
GFR calc Af Amer: >60 mL/min (ref >60)
GFR calc non Af Amer: >60 mL/min (ref >60)

## 2019-06-09 LAB — COMPREHENSIVE METABOLIC PANEL
ALT: 57 U/L — ABNORMAL HIGH (ref 0–44)
AST: 38 U/L (ref 15–41)
Albumin: 2.6 g/dL — ABNORMAL LOW (ref 3.5–5.0)
Alkaline Phosphatase: 71 U/L (ref 38–126)
Anion gap: 11 (ref 5–15)
BUN: 22 mg/dL (ref 8–23)
CO2: 22 mmol/L (ref 22–32)
Calcium: 7.9 mg/dL — ABNORMAL LOW (ref 8.9–10.3)
Chloride: 101 mmol/L (ref 98–111)
Creatinine, Ser: 0.87 mg/dL (ref 0.61–1.24)
GFR calc Af Amer: >60 mL/min (ref >60)
GFR calc non Af Amer: >60 mL/min (ref >60)
Glucose, Bld: 90 mg/dL (ref 70–99)
Potassium: 4.3 mmol/L (ref 3.5–5.1)
Sodium: 134 mmol/L — ABNORMAL LOW (ref 135–145)
Total Bilirubin: 1.3 mg/dL — ABNORMAL HIGH (ref 0.3–1.2)
Total Protein: 5.9 g/dL — ABNORMAL LOW (ref 6.5–8.1)

## 2019-06-09 LAB — CBC
HCT: 48.4 % (ref 39.0–52.0)
Hemoglobin: 16.3 g/dL (ref 13.0–17.0)
MCH: 29.3 pg (ref 26.0–34.0)
MCHC: 33.7 g/dL (ref 30.0–36.0)
MCV: 86.9 fL (ref 80.0–100.0)
Platelets: 138 10*3/uL — ABNORMAL LOW (ref 150–400)
RBC: 5.57 MIL/uL (ref 4.22–5.81)
RDW: 13.6 % (ref 11.5–15.5)
WBC: 11 10*3/uL — ABNORMAL HIGH (ref 4.0–10.5)
nRBC: 0 % (ref 0.0–0.2)

## 2019-06-09 LAB — D-DIMER, QUANTITATIVE: D-Dimer, Quant: 2.26 ug/mL-FEU — ABNORMAL HIGH (ref 0.00–0.50)

## 2019-06-09 LAB — PROCALCITONIN: Procalcitonin: <0.1 ng/mL

## 2019-06-09 LAB — C-REACTIVE PROTEIN: CRP: 11.7 mg/dL — ABNORMAL HIGH (ref <1.0)

## 2019-06-09 LAB — BRAIN NATRIURETIC PEPTIDE: B Natriuretic Peptide: 66.8 pg/mL (ref 0.0–100.0)

## 2019-06-09 LAB — ABO/RH: ABO/RH(D): A POS

## 2019-06-09 LAB — TROPONIN I (HIGH SENSITIVITY): Troponin I (High Sensitivity): 6 ng/L (ref <18)

## 2019-06-09 LAB — SARS CORONAVIRUS 2 (TAT 6-24 HRS): SARS Coronavirus 2: POSITIVE — AB

## 2019-06-09 MED ORDER — NITROGLYCERIN 0.4 MG SL SUBL: 0.4000 mg | SUBLINGUAL_TABLET | SUBLINGUAL | Status: DC | PRN

## 2019-06-09 MED ORDER — ONDANSETRON HCL 4 MG PO TABS: 4.0000 mg | ORAL_TABLET | Freq: Four times a day (QID) | ORAL | Status: DC | PRN

## 2019-06-09 MED ORDER — IOHEXOL 350 MG/ML SOLN
100.0000 mL | Freq: Once | INTRAVENOUS | Status: AC | PRN
  Administered 2019-06-09: 18:00:00 100 mL via INTRAVENOUS

## 2019-06-09 MED ORDER — ALBUTEROL SULFATE HFA 108 (90 BASE) MCG/ACT IN AERS
2.0000 | INHALATION_SPRAY | Freq: Four times a day (QID) | RESPIRATORY_TRACT | Status: DC | PRN
  Administered 2019-06-09 – 2019-06-13 (×2): 2 via RESPIRATORY_TRACT
  Filled 2019-06-09: qty 6.7

## 2019-06-09 MED ORDER — ENOXAPARIN SODIUM 40 MG/0.4ML ~~LOC~~ SOLN
40.0000 mg | SUBCUTANEOUS | Status: DC
  Administered 2019-06-09: 40 mg via SUBCUTANEOUS
  Filled 2019-06-09: qty 0.4

## 2019-06-09 MED ORDER — SODIUM CHLORIDE 0.9 % IV SOLN
100.0000 mg | Freq: Two times a day (BID) | INTRAVENOUS | Status: DC
  Administered 2019-06-09 – 2019-06-10 (×3): 100 mg via INTRAVENOUS
  Filled 2019-06-09 (×6): qty 100

## 2019-06-09 MED ORDER — SODIUM CHLORIDE 0.9 % IV BOLUS
1000.0000 mL | Freq: Once | INTRAVENOUS | Status: AC
  Administered 2019-06-09: 13:00:00 1000 mL via INTRAVENOUS

## 2019-06-09 MED ORDER — ALPRAZOLAM 0.25 MG PO TABS
0.2500 mg | ORAL_TABLET | Freq: Two times a day (BID) | ORAL | Status: DC | PRN
  Administered 2019-06-12 – 2019-06-21 (×9): 0.25 mg via ORAL
  Filled 2019-06-09 (×9): qty 1

## 2019-06-09 MED ORDER — ALBUTEROL SULFATE (2.5 MG/3ML) 0.083% IN NEBU
2.5000 mg | INHALATION_SOLUTION | RESPIRATORY_TRACT | Status: DC | PRN
  Filled 2019-06-09: qty 3

## 2019-06-09 MED ORDER — VALSARTAN-HYDROCHLOROTHIAZIDE 320-25 MG PO TABS: 1.0000 | ORAL_TABLET | Freq: Every day | ORAL | Status: DC

## 2019-06-09 MED ORDER — PANTOPRAZOLE SODIUM 40 MG PO TBEC
40.0000 mg | DELAYED_RELEASE_TABLET | Freq: Every day | ORAL | Status: DC
  Administered 2019-06-10 – 2019-06-22 (×13): 40 mg via ORAL
  Filled 2019-06-09 (×14): qty 1

## 2019-06-09 MED ORDER — VITAMIN C 500 MG PO TABS
1000.0000 mg | ORAL_TABLET | Freq: Every morning | ORAL | Status: DC
  Administered 2019-06-10 – 2019-06-22 (×13): 1000 mg via ORAL
  Filled 2019-06-09 (×13): qty 2

## 2019-06-09 MED ORDER — IRBESARTAN 300 MG PO TABS
300.0000 mg | ORAL_TABLET | Freq: Every day | ORAL | Status: DC
  Filled 2019-06-09: qty 1

## 2019-06-09 MED ORDER — ASPIRIN 81 MG PO CHEW
324.0000 mg | CHEWABLE_TABLET | Freq: Once | ORAL | Status: AC
  Administered 2019-06-09: 17:00:00 324 mg via ORAL
  Filled 2019-06-09: qty 4

## 2019-06-09 MED ORDER — ONDANSETRON HCL 4 MG/2ML IJ SOLN: 4.0000 mg | Freq: Four times a day (QID) | INTRAMUSCULAR | Status: DC | PRN

## 2019-06-09 MED ORDER — HYDROCHLOROTHIAZIDE 25 MG PO TABS: 25.0000 mg | ORAL_TABLET | Freq: Every day | ORAL | Status: DC

## 2019-06-09 MED ORDER — VITAMIN D 25 MCG (1000 UNIT) PO TABS
5000.0000 [IU] | ORAL_TABLET | Freq: Every morning | ORAL | Status: DC
  Administered 2019-06-10 – 2019-06-22 (×13): 5000 [IU] via ORAL
  Filled 2019-06-09 (×14): qty 5

## 2019-06-09 MED ORDER — METHYLPREDNISOLONE SODIUM SUCC 125 MG IJ SOLR
60.0000 mg | Freq: Two times a day (BID) | INTRAMUSCULAR | Status: DC
  Administered 2019-06-09 – 2019-06-10 (×2): 60 mg via INTRAVENOUS
  Filled 2019-06-09 (×2): qty 2

## 2019-06-09 MED ORDER — ACETAMINOPHEN 325 MG PO TABS: 650.0000 mg | ORAL_TABLET | Freq: Four times a day (QID) | ORAL | Status: DC | PRN

## 2019-06-09 NOTE — ED Notes (Signed)
Report given to RN @ [facility]. All questions answered. Report given to PTAR and RN to transport to [facility]

## 2019-06-09 NOTE — ED Notes (Signed)
ED Provider at bedside. 

## 2019-06-09 NOTE — ED Notes (Signed)
Patient transported to X-ray 

## 2019-06-09 NOTE — ED Provider Notes (Signed)
[facility] EMERGENCY DEPARTMENT Provider Note   CSN: [id number] Arrival date & time: 06/09/19  1058     History   Chief Complaint Chief Complaint  Patient presents with  . covid positive  . Shortness of Breath    HPI Alejandro Hicks is a 85 y.o. male who presents with SOB. PMH significant for HTN, GERD, remote hx of smoking. He states that he contracted COVID-19 while at [facility] in late September. He reports cough and SOB. He went to [facility] and was diagnosed with COVID. CXR shows pneumonia. He was hypoxic and then transferred to [facility]. He was in the hospital from 10/3-10/13. He was treated with Remdesivir and steroids. He was discharged with home O2 because he would desat with exertion and states that he felt okay when he was discharged but since being sent home he has worsened. He lives with his daughter but she has COVID too and can't help him. He reports worsening weakness, fatigue, SOB, and a dry cough. He denies fever, chest pain, leg swelling. He states "do everything you can for me".     HPI  Past Medical History:  Diagnosis Date  . Allergic rhinitis   . Anxiety   . Asthma   . BPH (benign prostatic hypertrophy)   . Colonic polyp   . Dyslipidemia   . ED (erectile dysfunction)   . GERD (gastroesophageal reflux disease)   . Inguinal hernia    Left S/P repair in 2012  . Leukoplakia of vocal cords   . OA (osteoarthritis)   . OSA (obstructive sleep apnea)    Does not use CPAP  . Reflux laryngitis    ENT ecal, PPIs twice a day  . Rosacea     Patient Active Problem List   Diagnosis Date Noted  . Pneumonia due to 2019-nCoV 05/28/2019  . Primary osteoarthritis of right hip 12/13/2018  . Plantar fasciitis, right 02/16/2018  . Primary osteoarthritis of right ankle 01/19/2018  . Shoulder impingement syndrome, left 10/01/2017  . Rib pain on right side 12/09/2015  . Right shoulder pain with history of right proximal biceps rupture  12/09/2015  . Essential hypertension, benign 10/21/2015  . Lumbar degenerative disc disease 09/02/2015  . DDD (degenerative disc disease), cervical 08/05/2015    Past Surgical History:  Procedure Laterality Date  . COLONOSCOPY  2009  . INGUINAL HERNIA REPAIR Left    2012        Home Medications    Prior to Admission medications   Medication Sig Start Date End Date Taking? Authorizing Provider  albuterol (PROAIR HFA) 108 (90 Base) MCG/ACT inhaler Use 2 puffs every 4 hours as needed for cough and wheeze. May use 10-20 minutes before exercise 02/09/17   Kozlow, Alvira Philips, MD  ALPRAZolam Prudy Feeler) 0.25 MG tablet Take 0.25 mg by mouth 2 (two) times daily as needed for anxiety.    [provider]  azelastine (ASTELIN) 0.1 % nasal spray PLACE 1 SPRAY INTO BOTH NOSTRILS 2 (TWO) TIMES DAILY. 08/30/17   Kozlow, Alvira Philips, MD  DEXILANT 60 MG capsule TAKE 1 CAPSULE (60 MG TOTAL) BY MOUTH EVERY MORNING. Patient taking differently: Take 60 mg by mouth every morning.  12/23/17   Kozlow, Alvira Philips, MD  fluticasone (FLONASE) 50 MCG/ACT nasal spray Place 1 spray into both nostrils 2 (two) times daily. 02/09/17   Kozlow, Alvira Philips, MD  gabapentin (NEURONTIN) 300 MG capsule One tab PO qHS for a week, then BID for a week,  then TID. May double weekly to a max of 3,600mg /day 10/03/18   Monica Bectonhekkekandam, Thomas J, MD  guaiFENesin-dextromethorphan (ROBITUSSIN DM) 100-10 MG/5ML syrup Take 10 mLs by mouth every 6 (six) hours as needed for cough. 06/06/19   Leroy SeaSingh, Prashant K, MD  Menaquinone-7 (VITAMIN K2 PO) Take by mouth daily.    [provider]  Nutritional Supplements (DHEA PO) Take by mouth daily.    [provider]  Pregnenolone Micronized POWD Use as directed in the mouth or throat daily.    [provider]  Spacer/Aero-Holding Chambers (AEROCHAMBER PLUS FLO-VU LARGE) MISC 1 each by Other route once. 11/11/15   Baxter HireHicks, Roselyn M, MD  tadalafil (CIALIS) 20 MG tablet Take 5 mg by mouth daily as  needed for erectile dysfunction.     [provider]  testosterone cypionate (DEPOTESTOSTERONE CYPIONATE) 200 MG/ML injection Inject 0.2 mg into the muscle once a week.  01/26/17   [provider]  valsartan-hydrochlorothiazide (DIOVAN-HCT) 320-25 MG tablet Take 1 tablet by mouth daily. 05/15/19   Monica Bectonhekkekandam, Thomas J, MD  Vitamin D, Cholecalciferol, 1000 UNITS TABS Take by mouth daily.    [provider]    Family History No family history on file.  Social History Social History   Tobacco Use  . Smoking status: Former Games developermoker  . Smokeless tobacco: Never Used  . Tobacco comment: Quit 1970  Substance Use Topics  . Alcohol use: Yes    Comment: social  . Drug use: No     Allergies   Lipitor [atorvastatin] and Zocor [simvastatin]   Review of Systems Review of Systems  Constitutional: Negative for fever.  Respiratory: Positive for cough and shortness of breath.   Cardiovascular: Negative for chest pain and leg swelling.  Gastrointestinal: Negative for abdominal pain.  Neurological: Positive for weakness.  All other systems reviewed and are negative.    Physical Exam Updated Vital Signs BP (!) 141/70   Pulse 68   Temp 98.3 F (36.8 C) (Oral)   Resp 13   Ht 5\' 6"  (1.676 m)   Wt 72.6 kg   SpO2 95%   BMI 25.82 kg/m   Physical Exam Vitals signs and nursing note reviewed.  Constitutional:      General: He is in acute distress (mild distress from sob).     Appearance: Normal appearance. He is well-developed. He is not ill-appearing.     Comments: Anxious. Cooperative. Frequent coughing. On 4L via [place]  HENT:     Head: Normocephalic and atraumatic.  Eyes:     General: No scleral icterus.       Right eye: No discharge.        Left eye: No discharge.     Conjunctiva/sclera: Conjunctivae normal.     Pupils: Pupils are equal, round, and reactive to light.  Neck:     Musculoskeletal: Normal range of motion.  Cardiovascular:     Rate and Rhythm:  Normal rate and regular rhythm.  Pulmonary:     Effort: Pulmonary effort is normal. Tachypnea present. No respiratory distress.     Breath sounds: Rales present.  Abdominal:     General: There is no distension.     Palpations: Abdomen is soft.     Tenderness: There is no abdominal tenderness.  Musculoskeletal:     Right lower leg: No edema.     Left lower leg: No edema.  Skin:    General: Skin is warm and dry.  Neurological:     Mental Status: He  is alert and oriented to person, place, and time.  Psychiatric:        Mood and Affect: Mood is anxious.        Behavior: Behavior normal.      ED Treatments / Results  Labs (all labs ordered are listed, but only abnormal results are displayed) Labs Reviewed  CBC WITH DIFFERENTIAL/PLATELET - Abnormal; Notable for the following components:      Result Value   WBC 14.2 (*)    Neutro Abs 12.7 (*)    Lymphs Abs 0.4 (*)    Abs Immature Granulocytes 0.10 (*)    All other components within normal limits  COMPREHENSIVE METABOLIC PANEL - Abnormal; Notable for the following components:   Sodium 134 (*)    Calcium 7.9 (*)    Total Protein 5.9 (*)    Albumin 2.6 (*)    ALT 57 (*)    Total Bilirubin 1.3 (*)    All other components within normal limits  SARS CORONAVIRUS 2 (TAT 6-24 HRS)    EKG EKG Interpretation  Date/Time:  Friday June 09 2019 12:08:22 EDT Ventricular Rate:  67 PR Interval:    QRS Duration: 89 QT Interval:  403 QTC Calculation: 426 R Axis:   -34 Text Interpretation:  Sinus rhythm Left ventricular hypertrophy Confirmed by Pattricia Boss [phone]) on 06/09/2019 1:03:32 PM   Radiology Dg Chest Port 1 View  Result Date: 06/09/2019 CLINICAL DATA:  Cough, COVID-19 positive EXAM: PORTABLE CHEST 1 VIEW COMPARISON:  06/02/2019 FINDINGS: Stable cardiomediastinal contours. Significant interval progression of multifocal bilateral airspace consolidations, with confluent opacities most pronounced in the right upper lobe and  left lower lobes. No pleural effusion or pneumothorax. IMPRESSION: Significant interval progression of multifocal pneumonia compared to prior. Electronically Signed   By: Davina Poke M.D.   On: 06/09/2019 12:43    Procedures Procedures (including critical care time)  Medications Ordered in ED Medications  sodium chloride 0.9 % bolus 1,000 mL (1,000 mLs Intravenous New Bag/Given 06/09/19 1305)     Initial Impression / Assessment and Plan / ED Course  I have reviewed the triage vital signs and the nursing notes.  Pertinent labs & imaging results that were available during my care of the patient were reviewed by me and considered in my medical decision making (see chart for details).  85 year old male presents with worsening cough and shortness of breath since diagnosis of COVID-19.  He was discharged from the hospital 3 days ago on home O2 and has been worsening.  On exam he is mildly anxious and tachypneic.  O2 sats dropped to 87% on 4 L via nasal cannula with minimal exertion.  Heart is regular rate and rhythm.  Abdomen is soft and nontender.  There are no clinical signs of DVT.  Repeat chest x-ray shows progression of multifocal pneumonia.  CBC is remarkable for mild leukocytosis of 14.  CMP is remarkable for mild hyponatremia, hypocalcemia, low protein, minimally elevated bilirubin.  Shared visit with Dr. Jeanell Sparrow.  Will admit for further management  Discussed with Dr. Doristine Bosworth who will come to see.  Final Clinical Impressions(s) / ED Diagnoses   Final diagnoses:  Pneumonia due to COVID-19 virus  Acute respiratory failure with hypoxia [facility])    ED Discharge Orders    None       Recardo Evangelist, PA-C 06/09/19 1333    Pattricia Boss, MD 06/12/19 1253

## 2019-06-09 NOTE — ED Triage Notes (Signed)
Pt states covid positive 1 week ago, states typically on home O2-was unsure how many liters, currently on 3L O2, pt c/o severe sob, respirations labored. Denies n/v/d

## 2019-06-09 NOTE — ED Notes (Signed)
Called ptar- will be about an hour and a half

## 2019-06-09 NOTE — ED Notes (Signed)
Pt returned to room from Xray. 

## 2019-06-09 NOTE — ED Notes (Signed)
This RN spoke with and updated daughter with current plan of care and pending transfer to [place].

## 2019-06-09 NOTE — H&P (Signed)
History and Physical    Alejandro SheenBobby W Hicks GNF:[id number] DOB: 11/18/1933 DOA: 06/09/2019  PCP: Georgann HousekeeperHusain, Karrar, MD  Patient coming from: Home I have personally briefly reviewed patient's [facility]  Chief Complaint: Worsening shortness of breath and generalized weakness  HPI: Alejandro SheenBobby W Hicks is a 85 y.o. male with medical history significant of hypertension, asthma, GERD, former smoker, anxiety presents to emergency department due to worsening shortness of breath, cough and generalized weakness.  Patient recently admitted at [facility] from 05/27/2019 to 06/06/2019 due to COVID pneumonia.  He developed acute hypoxemic respiratory failure secondary to pneumonia.  He received Remdisvir & steroids and his symptoms improved and discharged home on home oxygen.  Today patient reports that he is not feeling well overall, has worsening dry cough and shortness of breath, chest pain associated with cough, decrease appetite, weakness and lethargy.  Denies headache, blurry vision, lightheadedness, nausea, vomiting, abdominal pain, fever, chills, palpitation, leg swelling, urinary or bowel changes.  ED Course: Upon arrival: Patient was hypoxemic.  He was placed on 3 L of oxygen via nasal cannula.  Chest x-ray was obtained which showed significant interval progression of multifocal pneumonia compared to prior study.  Review of Systems: As per HPI otherwise negative.    Past Medical History:  Diagnosis Date  . Allergic rhinitis   . Anxiety   . Asthma   . BPH (benign prostatic hypertrophy)   . Colonic polyp   . Dyslipidemia   . ED (erectile dysfunction)   . GERD (gastroesophageal reflux disease)   . Inguinal hernia    Left S/P repair in 2012  . Leukoplakia of vocal cords   . OA (osteoarthritis)   . OSA (obstructive sleep apnea)    Does not use CPAP  . Reflux laryngitis    ENT ecal, PPIs twice a day  . Rosacea     Past Surgical History:  Procedure Laterality Date   . COLONOSCOPY  2009  . INGUINAL HERNIA REPAIR Left    2012     reports that he has quit smoking. He has never used smokeless tobacco. He reports current alcohol use. He reports that he does not use drugs.  Allergies  Allergen Reactions  . Lipitor [Atorvastatin] Other (See Comments)    Myalgia  . Zocor [Simvastatin] Other (See Comments)    Myalgia    No family history on file.  Prior to Admission medications   Medication Sig Start Date End Date Taking? Authorizing Provider  acetaminophen (TYLENOL) 325 MG tablet Take 650 mg by mouth every 6 (six) hours as needed for headache (pain).   Yes [provider]  albuterol (PROAIR HFA) 108 (90 Base) MCG/ACT inhaler Use 2 puffs every 4 hours as needed for cough and wheeze. May use 10-20 minutes before exercise Patient taking differently: Inhale 2 puffs into the lungs every 4 (four) hours as needed for wheezing (cough or 10-20 minutes before exercise).  02/09/17  Yes Kozlow, Alvira PhilipsEric J, MD  ALPRAZolam Prudy Feeler(XANAX) 0.25 MG tablet Take 0.25 mg by mouth 2 (two) times daily as needed for anxiety.   Yes [provider]  Ascorbic Acid (VITAMIN C) 1000 MG tablet Take 1,000 mg by mouth every morning.   Yes [provider]  budesonide (RHINOCORT ALLERGY) 32 MCG/ACT nasal spray Place 1 spray into both nostrils daily as needed for rhinitis.   Yes [provider]  Cholecalciferol (VITAMIN D-3) 125 MCG (5000 UT) TABS Take 5,000 Units by mouth every morning.  Yes [provider]  DEXILANT 60 MG capsule TAKE 1 CAPSULE (60 MG TOTAL) BY MOUTH EVERY MORNING. Patient taking differently: Take 60 mg by mouth every morning.  12/23/17  Yes Kozlow, Alvira Philips, MD  guaiFENesin-dextromethorphan (ROBITUSSIN DM) 100-10 MG/5ML syrup Take 10 mLs by mouth every 6 (six) hours as needed for cough. 06/06/19  Yes Leroy Sea, MD  ibuprofen (ADVIL) 200 MG tablet Take 400 mg by mouth every 6 (six) hours as needed for headache (pain).   Yes  [provider]  Menaquinone-7 (VITAMIN K2 PO) Take 1 tablet by mouth every morning.    Yes [provider]  OXYGEN Inhale into the lungs continuous.   Yes [provider]  testosterone cypionate (DEPOTESTOSTERONE CYPIONATE) 200 MG/ML injection Inject 60 mg into the muscle once a week. 0.3 ml - 60 mg (inject into lateral thigh or buttock) 01/26/17  Yes [provider]  valsartan-hydrochlorothiazide (DIOVAN-HCT) 320-25 MG tablet Take 1 tablet by mouth daily. Patient taking differently: Take 1 tablet by mouth every morning.  05/15/19  Yes Monica Becton, MD  azelastine (ASTELIN) 0.1 % nasal spray PLACE 1 SPRAY INTO BOTH NOSTRILS 2 (TWO) TIMES DAILY. Patient not taking: Reported on 06/09/2019 08/30/17   Jessica Priest, MD  fluticasone [facility]) 50 MCG/ACT nasal spray Place 1 spray into both nostrils 2 (two) times daily. Patient not taking: Reported on 06/09/2019 02/09/17   Kozlow, Alvira Philips, MD  Spacer/Aero-Holding Chambers (AEROCHAMBER PLUS FLO-VU LARGE) MISC 1 each by Other route once. 11/11/15   Baxter Hire, MD    Physical Exam: Vitals:   06/09/19 1330 06/09/19 1345 06/09/19 1416 06/09/19 1500  BP: (!) 161/67 (!) 161/74 (!) 162/149 (!) 157/69  Pulse: 73 78 85 87  Resp: (!) 25 (!) 24 (!) 25 (!) 29  Temp:      TempSrc:      SpO2: 95% 94% 90% 90%  Weight:      Height:        Constitutional: NAD, calm, comfortable Vitals:   06/09/19 1330 06/09/19 1345 06/09/19 1416 06/09/19 1500  BP: (!) 161/67 (!) 161/74 (!) 162/149 (!) 157/69  Pulse: 73 78 85 87  Resp: (!) 25 (!) 24 (!) 25 (!) 29  Temp:      TempSrc:      SpO2: 95% 94% 90% 90%  Weight:      Height:       Constitutional: Alert and oriented x3, communicating well, on 3 L of oxygen via nasal cannula.  Not in respiratory distress.   Eyes: PERRL, lids and conjunctivae normal ENMT: Mucous membranes are moist. Posterior pharynx clear of any exudate or lesions.Normal dentition.  Neck: normal,  supple, no masses, no thyromegaly Respiratory: clear to auscultation bilaterally, no wheezing, no crackles. Normal respiratory effort. No accessory muscle use.  Cardiovascular: Regular rate and rhythm, no murmurs / rubs / gallops. No extremity edema. 2+ pedal pulses. No carotid bruits.  Abdomen: no tenderness, no masses palpated. No hepatosplenomegaly. Bowel sounds positive.  Musculoskeletal: no clubbing / cyanosis. No joint deformity upper and lower extremities. Good ROM, no contractures. Normal muscle tone.  Skin: no rashes, lesions, ulcers. No induration Neurologic: CN 2-12 grossly intact. Sensation intact, DTR normal. Strength 5/5 in all 4.  Psychiatric: Normal judgment and insight. Alert and oriented x 3. Normal mood.    Labs on Admission: I have personally reviewed following labs and imaging studies  CBC: Recent Labs  Lab 06/03/19 0420 06/05/19 0500 06/06/19 0130 06/09/19 1130  WBC 11.5* 12.3* 10.9* 14.2*  NEUTROABS  --  10.4* 9.3* 12.7*  HGB 15.3 16.9 15.1 16.6  HCT 47.1 53.3* 47.0 50.6  MCV 87.9 89.0 88.0 88.2  PLT 199 217 199 161   Basic Metabolic Panel: Recent Labs  Lab 06/03/19 0420 06/04/19 0339 06/05/19 0500 06/06/19 0130 06/09/19 1130  NA 138 138 137 135 134*  K 3.9 4.5 4.4 4.9 4.3  CL 103 100 99 101 101  CO2 25 28 24 25 22   GLUCOSE 144* 130* 134* 139* 90  BUN 38* 36* 34* 31* 22  CREATININE 1.01 0.99 0.94 0.97 0.87  CALCIUM 8.1* 8.2* 8.2* 8.0* 7.9*   GFR: Estimated Creatinine Clearance: 59.1 mL/min (by C-G formula based on SCr of 0.87 mg/dL). Liver Function Tests: Recent Labs  Lab 06/03/19 0420 06/04/19 0339 06/05/19 0500 06/06/19 0130 06/09/19 1130  AST 44* 45* 57* 45* 38  ALT 83* 83* 99* 85* 57*  ALKPHOS 65 66 71 63 71  BILITOT 0.5 0.5 0.7 0.7 1.3*  PROT 5.4* 5.5* 6.1* 5.4* 5.9*  ALBUMIN 2.9* 2.8* 3.0* 2.6* 2.6*   No results for input(s): LIPASE, AMYLASE in the last 168 hours. No results for input(s): AMMONIA in the last 168  hours. Coagulation Profile: No results for input(s): INR, PROTIME in the last 168 hours. Cardiac Enzymes: No results for input(s): CKTOTAL, CKMB, CKMBINDEX, TROPONINI in the last 168 hours. BNP (last 3 results) No results for input(s): PROBNP in the last 8760 hours. HbA1C: No results for input(s): HGBA1C in the last 72 hours. CBG: No results for input(s): GLUCAP in the last 168 hours. Lipid Profile: No results for input(s): CHOL, HDL, LDLCALC, TRIG, CHOLHDL, LDLDIRECT in the last 72 hours. Thyroid Function Tests: No results for input(s): TSH, T4TOTAL, FREET4, T3FREE, THYROIDAB in the last 72 hours. Anemia Panel: No results for input(s): VITAMINB12, FOLATE, FERRITIN, TIBC, IRON, RETICCTPCT in the last 72 hours. Urine analysis:    Component Value Date/Time   COLORURINE YELLOW 05/30/2011 2051   APPEARANCEUR CLEAR 05/30/2011 2051   LABSPEC 1.022 05/30/2011 2051   PHURINE 6.5 05/30/2011 2051   GLUCOSEU NEGATIVE 05/30/2011 2051   HGBUR NEGATIVE 05/30/2011 2051   BILIRUBINUR NEGATIVE 05/30/2011 2051   KETONESUR NEGATIVE 05/30/2011 2051   PROTEINUR NEGATIVE 05/30/2011 2051   UROBILINOGEN 0.2 05/30/2011 2051   NITRITE NEGATIVE 05/30/2011 2051   LEUKOCYTESUR NEGATIVE 05/30/2011 2051    Radiological Exams on Admission: Dg Chest Port 1 View  Result Date: 06/09/2019 CLINICAL DATA:  Cough, COVID-19 positive EXAM: PORTABLE CHEST 1 VIEW COMPARISON:  06/02/2019 FINDINGS: Stable cardiomediastinal contours. Significant interval progression of multifocal bilateral airspace consolidations, with confluent opacities most pronounced in the right upper lobe and left lower lobes. No pleural effusion or pneumothorax. IMPRESSION: Significant interval progression of multifocal pneumonia compared to prior. Electronically Signed   By: 08/02/2019 M.D.   On: 06/09/2019 12:43    EKG: Normal sinus rhythm, no acute ST elevation or depression noted.  Assessment/Plan Principal Problem:   Acute hypoxemic  respiratory failure due to COVID-19 [facility]) Active Problems:   Essential hypertension, benign   Pneumonia due to COVID-19 virus   GERD (gastroesophageal reflux disease)   Elevated liver enzymes   Acute hypoxemic respiratory failure due to COVID-19: -Patient presented with worsening of symptoms.  Chest x-ray: Significant interval progression of multifocal pneumonia. -Patient is afebrile, has leukocytosis of 14.2-likely secondary to recent steroid use. -Currently on 3 L of oxygen via nasal cannula. -We will admit patient at [facility] for  close monitoring. -Continuous pulse ox.  On telemetry. -Troponin and BNP: WNL.  D-dimer, CRP: Elevated.  Procalcitonin: WNL. -Repeat inflammatory markers tomorrow a.m. -Ordered CT angiogram-pending  -started on Solu-Medrol 60 mg IV twice daily  Elevated liver enzymes: -ALT: 57, total bilirubin: 1.3. -Likely secondary to COVID infection. -Continue to monitor.  Hypertension: Well-controlled -Continue valsartan-HCTZ. -Monitor blood pressure closely.  GERD: Stable -Continue Protonix  Anxiety: Continue Xanax PRN for anxiety.    DVT prophylaxis: TED/SCD/Lovenox  code Status: Full code Family Communication:  None present at bedside.  Plan of care discussed with patient in length and he verbalized understanding and agreed with it. Disposition Plan: TBD Consults called: None Admission status: Inpatient at [place] MD [facility] Pager [phone]  If 7PM-7AM, please contact night-coverage [email or web address] Password [email or web address]  06/09/2019, 4:00 PM

## 2019-06-10 LAB — COMPREHENSIVE METABOLIC PANEL
ALT: 44 U/L (ref 0–44)
AST: 28 U/L (ref 15–41)
Albumin: 2.5 g/dL — ABNORMAL LOW (ref 3.5–5.0)
Alkaline Phosphatase: 64 U/L (ref 38–126)
Anion gap: 7 (ref 5–15)
BUN: 23 mg/dL (ref 8–23)
CO2: 25 mmol/L (ref 22–32)
Calcium: 7.8 mg/dL — ABNORMAL LOW (ref 8.9–10.3)
Chloride: 106 mmol/L (ref 98–111)
Creatinine, Ser: 0.78 mg/dL (ref 0.61–1.24)
GFR calc Af Amer: >60 mL/min (ref >60)
GFR calc non Af Amer: >60 mL/min (ref >60)
Glucose, Bld: 127 mg/dL — ABNORMAL HIGH (ref 70–99)
Potassium: 4.7 mmol/L (ref 3.5–5.1)
Sodium: 138 mmol/L (ref 135–145)
Total Bilirubin: 1.2 mg/dL (ref 0.3–1.2)
Total Protein: 5.6 g/dL — ABNORMAL LOW (ref 6.5–8.1)

## 2019-06-10 LAB — CBC WITH DIFFERENTIAL/PLATELET
Abs Immature Granulocytes: 0.04 10*3/uL (ref 0.00–0.07)
Basophils Absolute: 0 10*3/uL (ref 0.0–0.1)
Basophils Relative: 0 %
Eosinophils Absolute: 0 10*3/uL (ref 0.0–0.5)
Eosinophils Relative: 0 %
HCT: 47.9 % (ref 39.0–52.0)
Hemoglobin: 15.5 g/dL (ref 13.0–17.0)
Immature Granulocytes: 1 %
Lymphocytes Relative: 3 %
Lymphs Abs: 0.2 10*3/uL — ABNORMAL LOW (ref 0.7–4.0)
MCH: 28.2 pg (ref 26.0–34.0)
MCHC: 32.4 g/dL (ref 30.0–36.0)
MCV: 87.1 fL (ref 80.0–100.0)
Monocytes Absolute: 0.1 10*3/uL (ref 0.1–1.0)
Monocytes Relative: 2 %
Neutro Abs: 7.1 10*3/uL (ref 1.7–7.7)
Neutrophils Relative %: 94 %
Platelets: 148 10*3/uL — ABNORMAL LOW (ref 150–400)
RBC: 5.5 MIL/uL (ref 4.22–5.81)
RDW: 13.6 % (ref 11.5–15.5)
WBC: 7.5 10*3/uL (ref 4.0–10.5)
nRBC: 0 % (ref 0.0–0.2)

## 2019-06-10 LAB — FERRITIN: Ferritin: 154 ng/mL (ref 24–336)

## 2019-06-10 LAB — PHOSPHORUS: Phosphorus: 3.2 mg/dL (ref 2.5–4.6)

## 2019-06-10 LAB — D-DIMER, QUANTITATIVE: D-Dimer, Quant: 1.43 ug/mL-FEU — ABNORMAL HIGH (ref 0.00–0.50)

## 2019-06-10 LAB — MAGNESIUM: Magnesium: 2.3 mg/dL (ref 1.7–2.4)

## 2019-06-10 LAB — PROCALCITONIN: Procalcitonin: <0.1 ng/mL

## 2019-06-10 LAB — C-REACTIVE PROTEIN: CRP: 10.9 mg/dL — ABNORMAL HIGH (ref <1.0)

## 2019-06-10 MED ORDER — HYDROCOD POLST-CPM POLST ER 10-8 MG/5ML PO SUER
5.0000 mL | Freq: Two times a day (BID) | ORAL | Status: DC | PRN
  Administered 2019-06-10 – 2019-06-16 (×10): 5 mL via ORAL
  Filled 2019-06-10 (×11): qty 5

## 2019-06-10 MED ORDER — LACTATED RINGERS IV SOLN
INTRAVENOUS | Status: AC
  Administered 2019-06-10: 10:00:00 via INTRAVENOUS

## 2019-06-10 MED ORDER — ENOXAPARIN SODIUM 40 MG/0.4ML ~~LOC~~ SOLN
40.0000 mg | SUBCUTANEOUS | Status: DC
  Administered 2019-06-10 – 2019-06-21 (×12): 40 mg via SUBCUTANEOUS
  Filled 2019-06-10 (×12): qty 0.4

## 2019-06-10 MED ORDER — METHYLPREDNISOLONE SODIUM SUCC 125 MG IJ SOLR
60.0000 mg | Freq: Every day | INTRAMUSCULAR | Status: DC
  Administered 2019-06-11: 09:00:00 60 mg via INTRAVENOUS
  Filled 2019-06-10: qty 2

## 2019-06-10 MED ORDER — HYDRALAZINE HCL 20 MG/ML IJ SOLN: 10.0000 mg | Freq: Four times a day (QID) | INTRAMUSCULAR | Status: DC | PRN

## 2019-06-10 MED ORDER — POLYETHYLENE GLYCOL 3350 17 G PO PACK
17.0000 g | PACK | Freq: Every day | ORAL | Status: DC
  Administered 2019-06-10 – 2019-06-22 (×10): 17 g via ORAL
  Filled 2019-06-10 (×10): qty 1

## 2019-06-10 NOTE — Progress Notes (Addendum)
                                  PROGRESS NOTE                                                                                                                                                                                                             Patient Demographics:    Alejandro Hicks, is a 85 y.o. male, DOB - 02/03/1934, MRN:[id number]  Outpatient Primary MD for the patient is Husain, Karrar, MD    LOS - 1  Admit date - 06/09/2019    Chief Complaint  Patient presents with  . covid positive  . Shortness of Breath       Brief Narrative  - 85 y.o. male with medical history significant of hypertension, asthma, GERD, former smoker, anxiety recently admitted and treated for COVID-19 pneumonia and discharged home after a prolonged hospital stay comes back soon after discharge with shortness of breath at home.  He was discharged on home oxygen but he never learned how to use it, he now says that he was unable to care for himself at the house and he should have gone to SNF where we were recommending him to go but he refused.  When he came to the ER second time he was on room air and hypoxic, he was placed on 3 L nasal cannula oxygen with resolution of his symptoms.  His blood work did show mild rise in CRP as compared to what he was before other than that unremarkable work-up including CT scan chest which ruled out PE.   Subjective:    Alejandro Hicks today has, No headache, No chest pain, No abdominal pain - No Nausea, No new weakness tingling or numbness, improved cough and shortness of breath   Assessment  & Plan :    1 . Acute Hypoxic Resp. Failure due to Acute Covid 19 Viral Pneumonitis during the ongoing 2020 Covid 19 Pandemic - his pneumonitis and respiratory failure is stable as it was at the time of discharge, patient was unable to take care of himself at home and unable to use provided home oxygen.  He refused SNF placement last time and he says he made a  grave mistake and will use it now.  He was adequately treated last time with prolonged steroid use and Remdisvir.  He was diagnosed with COVID-19 infection on 05/27/2019 initially.  This admission his CT angiogram chest is   unremarkable, since his CRP is elevated I have asked him on IV steroids which he will continue for a day or 2 thereafter will put him on oral steroid taper.  His physical exam is stable he is stable on 1 L nasal cannula oxygen.  Possibility of mild bronchitis.  Placed on doxycycline.  Monitor sputum Gram stain culture.  Procalcitonin is negative.  However I do not think he can care for himself at home as his son and daughter-in-law who live with him both have COVID-19 infection and are pretty sick.  He clearly failed home discharge.  Will provide him supportive care, PT OT eval and look for SNF discharge.   COVID-19 Labs  Recent Labs    06/09/19 1400 06/10/19 0505  DDIMER 2.26* 1.43*  CRP 11.7*  --     Lab Results  Component Value Date   SARSCOV2NAA POSITIVE (A) 06/09/2019     SpO2: 98 % O2 Flow Rate (L/min): 1 L/min  Hepatic Function Latest Ref Rng & Units 06/10/2019 06/09/2019 06/06/2019  Total Protein 6.5 - 8.1 g/dL 5.6(L) 5.9(L) 5.4(L)  Albumin 3.5 - 5.0 g/dL 2.5(L) 2.6(L) 2.6(L)  AST 15 - 41 U/L 28 38 45(H)  ALT 0 - 44 U/L 44 57(H) 85(H)  Alk Phosphatase 38 - 126 U/L 64 71 63  Total Bilirubin 0.3 - 1.2 mg/dL 1.2 1.3(H) 0.7        Component Value Date/Time   BNP 66.8 06/09/2019 1400      2.  GERD.  PPI.  3.  Dehydration with deconditioning.  IV fluids and PT OT.  4.  Hypertension.  Currently dehydrated blood pressure low.  Hydrate and hold blood pressure medications.  5.  Mild transaminitis.  COVID-19 related and recent use of Remdisvir.  Trend is stable and resolving.  Symptom-free.     Hepatic Function Latest Ref Rng & Units 06/10/2019 06/09/2019 06/06/2019  Total Protein 6.5 - 8.1 g/dL 5.6(L) 5.9(L) 5.4(L)  Albumin 3.5 - 5.0 g/dL 2.5(L)  2.6(L) 2.6(L)  AST 15 - 41 U/L 28 38 45(H)  ALT 0 - 44 U/L 44 57(H) 85(H)  Alk Phosphatase 38 - 126 U/L 64 71 63  Total Bilirubin 0.3 - 1.2 mg/dL 1.2 1.3(H) 0.7     Condition - Fair  Family Communication  :  None  Code Status :  Full  Diet :   Diet Order            Diet 2 gram sodium Room service appropriate? Yes; Fluid consistency: Thin  Diet effective now               Disposition Plan  :  SNF  Consults  :  None  Procedures  :   CTA - Non acute  PUD Prophylaxis :  PPI  DVT Prophylaxis  :  Lovenox added  Lab Results  Component Value Date   PLT 148 (L) 06/10/2019    Inpatient Medications  Scheduled Meds: . cholecalciferol  5,000 Units Oral q morning - 10a  . [START ON 06/11/2019] methylPREDNISolone (SOLU-MEDROL) injection  60 mg Intravenous Daily  . pantoprazole  40 mg Oral Daily  . polyethylene glycol  17 g Oral Daily  . vitamin C  1,000 mg Oral q morning - 10a   Continuous Infusions: . doxycycline (VIBRAMYCIN) IV Stopped (06/10/19 0030)  . lactated ringers     PRN Meds:.acetaminophen, albuterol, ALPRAZolam, chlorpheniramine-HYDROcodone, hydrALAZINE, nitroGLYCERIN, [DISCONTINUED] ondansetron **OR** ondansetron (ZOFRAN) IV  Antibiotics  :    Anti-infectives (From admission,  onward)   Start     Dose/Rate Route Frequency Ordered Stop   06/09/19 1930  doxycycline (VIBRAMYCIN) 100 mg in sodium chloride 0.9 % 250 mL IVPB     100 mg 125 mL/hr over 120 Minutes Intravenous Every 12 hours 06/09/19 1623         Time Spent in minutes  30   Lala Lund M.D on 06/10/2019 at 8:51 AM  To page go to [email or web address] - password [facility]  [facility] -  Office  [phone]   See all Orders from today for further details    Objective:   Vitals:   06/09/19 2200 06/09/19 2209 06/09/19 2332 06/10/19 0345  BP: 138/79  (!) 142/78 109/68  Pulse: 62  72 (!) 57  Resp: 20  20 (!) 21  Temp:  98.4 F (36.9 C) 98.6 F (37 C) 98.6 F (37 C)  TempSrc:   Oral Oral Oral  SpO2: 95%  94% 98%  Weight:      Height:        Wt Readings from Last 3 Encounters:  06/09/19 72.6 kg  05/28/19 72.4 kg  05/27/19 72.6 kg     Intake/Output Summary (Last 24 hours) at 06/10/2019 0851 Last data filed at 06/10/2019 0300 Gross per 24 hour  Intake 1343.07 ml  Output 1250 ml  Net 93.07 ml     Physical Exam  Awake Alert,  No new F.N deficits, Normal affect [place].AT,PERRAL Supple Neck,No JVD, No cervical lymphadenopathy appriciated.  Symmetrical Chest wall movement, Good air movement bilaterally, CTAB RRR,No Gallops,Rubs or new Murmurs, No Parasternal Heave +ve B.Sounds, Abd Soft, No tenderness, No organomegaly appriciated, No rebound - guarding or rigidity. No Cyanosis, Clubbing or edema, No new Rash or bruise       Data Review:    CBC Recent Labs  Lab 06/05/19 0500 06/06/19 0130 06/09/19 1130 06/09/19 2102 06/10/19 0505  WBC 12.3* 10.9* 14.2* 11.0* 7.5  HGB 16.9 15.1 16.6 16.3 15.5  HCT 53.3* 47.0 50.6 48.4 47.9  PLT 217 199 161 138* 148*  MCV 89.0 88.0 88.2 86.9 87.1  MCH 28.2 28.3 28.9 29.3 28.2  MCHC 31.7 32.1 32.8 33.7 32.4  RDW 14.2 14.0 13.7 13.6 13.6  LYMPHSABS 0.6* 0.4* 0.4*  --  0.2*  MONOABS 0.7 0.6 0.8  --  0.1  EOSABS 0.0 0.0 0.2  --  0.0  BASOSABS 0.1 0.0 0.0  --  0.0    Chemistries  Recent Labs  Lab 06/04/19 0339 06/05/19 0500 06/06/19 0130 06/09/19 1130 06/09/19 2102 06/10/19 0505  NA 138 137 135 134*  --  138  K 4.5 4.4 4.9 4.3  --  4.7  CL 100 99 101 101  --  106  CO2 _0 --  25  GLUCOSE 130* 134* 139* 90  --  127*  BUN 36* 34* 31* 22  --  23  CREATININE 0.99 0.94 0.97 0.87 0.96 0.78  CALCIUM 8.2* 8.2* 8.0* 7.9*  --  7.8*  MG  --   --   --   --   --  2.3  AST 45* 57* 45* 38  --  28  ALT 83* 99* 85* 57*  --  44  ALKPHOS 66 71 63 71  --  64  BILITOT 0.5 0.7 0.7 1.3*  --  1.2    ------------------------------------------------------------------------------------------------------------------ No results for input(s): CHOL, HDL, LDLCALC, TRIG, CHOLHDL, LDLDIRECT in the last 72 hours.  No results found for: HGBA1C ------------------------------------------------------------------------------------------------------------------ No  results for input(s): TSH, T4TOTAL, T3FREE, THYROIDAB in the last 72 hours.  Invalid input(s): FREET3  Cardiac Enzymes No results for input(s): CKMB, TROPONINI, MYOGLOBIN in the last 168 hours.  Invalid input(s): CK ------------------------------------------------------------------------------------------------------------------    Component Value Date/Time   BNP 66.8 06/09/2019 1400    Micro Results Recent Results (from the past 240 hour(s))  SARS CORONAVIRUS 2 (TAT 6-24 HRS) Nasopharyngeal Nasopharyngeal Swab     Status: Abnormal   Collection Time: 06/09/19  1:20 PM   Specimen: Nasopharyngeal Swab  Result Value Ref Range Status   SARS Coronavirus 2 POSITIVE (A) NEGATIVE Final    Comment: CRITICAL RESULT CALLED TO, READ BACK BY AND VERIFIED WITH: S.GRINDSTAFF RN 1950 06/09/2019 MCCORMICK K (NOTE) SARS-CoV-2 target nucleic acids are DETECTED. The SARS-CoV-2 RNA is generally detectable in upper and lower respiratory specimens during the acute phase of infection. Positive results are indicative of active infection with SARS-CoV-2. Clinical  correlation with patient history and other diagnostic information is necessary to determine patient infection status. Positive results do  not rule out bacterial infection or co-infection with other viruses. The expected result is Negative. Fact Sheet for Patients: https://www.fda.gov/media/138098/download Fact Sheet for Healthcare Providers: https://www.fda.gov/media/138095/download This test is not yet approved or cleared by the [place] FDA and  has been authorized for detection  and/or diagnosis of SARS-CoV-2 by FDA under an Emergency Use Authorization (EUA). This EUA will remain  in effect (meaning this test c an be used) for the duration of the COVID-19 declaration under Section 564(b)(1) of the Act, 21 U.S.C. section 360bbb-3(b)(1), unless the authorization is terminated or revoked sooner. Performed at [facility], [address] [place], [place] [address]     Radiology Reports Dg Chest 2 View  Result Date: 05/27/2019 CLINICAL DATA:  Cough, covid EXAM: CHEST - 2 VIEW COMPARISON:  October 04, 2017 FINDINGS: The heart size and mediastinal contours are within normal limits. There is mildly increased hazy airspace opacity seen within the periphery of the right upper lung and the left lower lung. Aortic knob calcifications are seen. No acute osseous abnormality. IMPRESSION: Hazy peripheral airspace opacity is within both lungs. The findings in the lungs are nonspecific, but concerning for atypical infection, which includes viral pneumonia. Electronically Signed   By: Bindu  Avutu M.D.   On: 05/27/2019 21:23   Ct Angio Chest Pe W Or Wo Contrast  Result Date: 06/09/2019 CLINICAL DATA:  PE suspected, high pretest probability, COVID-19 positive 1 week prior currently on 3 L home O2 EXAM: CT ANGIOGRAPHY CHEST WITH CONTRAST TECHNIQUE: Multidetector CT imaging of the chest was performed using the standard protocol during bolus administration of intravenous contrast. Multiplanar CT image reconstructions and MIPs were obtained to evaluate the vascular anatomy. CONTRAST:  100mL OMNIPAQUE IOHEXOL 350 MG/ML SOLN COMPARISON:  Radiograph 06/09/2019 FINDINGS: Cardiovascular: Satisfactory opacification of the pulmonary arteries to the segmental level. No evidence of pulmonary embolism. Normal heart size. No pericardial effusion. Atherosclerotic calcification of the coronary arteries. Atherosclerotic plaque within the normal caliber aorta. Normal 3 vessel branching of the aortic  arch. No evident abnormality of proximal great vessels. Mediastinum/Nodes: Prominent low-attenuation mediastinal nodes are likely reactive. No pathologically enlarged mediastinal, hilar axillary adenopathy. Lungs/Pleura: There are multifocal areas airspace consolidation with surrounding patchy areas of ground-glass opacity and intervening air bronchograms. No pneumothorax or effusion. Some atelectatic volume loss is noted in the left lung. Upper Abdomen: Post cholecystectomy. No acute abnormalities present in the visualized portions of the upper abdomen. Musculoskeletal: Multilevel degenerative   changes are present in the imaged portions of the spine. No acute osseous abnormality or suspicious osseous lesion. Mild body wall edema. Mild right gynecomastia. Review of the MIP images confirms the above findings. IMPRESSION: 1. No evidence of pulmonary embolism. 2. Multifocal areas of airspace consolidation with surrounding patchy areas of ground-glass opacity and intervening air bronchograms, compatible with multifocal pneumonia including COVID-19 etiology. 3. Aortic Atherosclerosis (ICD10-I70.0). Electronically Signed   By: Lovena Le M.D.   On: 06/09/2019 18:20   Dg Chest Port 1 View  Result Date: 06/09/2019 CLINICAL DATA:  Cough, COVID-19 positive EXAM: PORTABLE CHEST 1 VIEW COMPARISON:  06/02/2019 FINDINGS: Stable cardiomediastinal contours. Significant interval progression of multifocal bilateral airspace consolidations, with confluent opacities most pronounced in the right upper lobe and left lower lobes. No pleural effusion or pneumothorax. IMPRESSION: Significant interval progression of multifocal pneumonia compared to prior. Electronically Signed   By: Davina Poke M.D.   On: 06/09/2019 12:43   Dg Chest Port 1v Same Day  Result Date: 06/02/2019 CLINICAL DATA:  Shortness of breath EXAM: PORTABLE CHEST 1 VIEW COMPARISON:  Six days ago FINDINGS: Patchy bilateral pneumonia with mild increase. Normal  heart size accounting for elevated right diaphragm and technique. No edema, effusion, or pneumothorax IMPRESSION: COVID-19 with patchy bilateral pneumonia that is more conspicuous than on prior. Electronically Signed   By: Monte Fantasia M.D.   On: 06/02/2019 08:55

## 2019-06-10 NOTE — Progress Notes (Signed)
ANTICOAGULATION CONSULT NOTE - Initial Consult  Pharmacy Consult for Lovenox Indication: VTE prophylaxis  Allergies  Allergen Reactions  . Lipitor [Atorvastatin] Other (See Comments)    Myalgia  . Zocor [Simvastatin] Other (See Comments)    Myalgia    Patient Measurements: Height: 5\' 6"  (167.6 cm) Weight: 160 lb (72.6 kg) IBW/kg (Calculated) : 63.8  Vital Signs: Temp: 98.6 F (37 C) (10/17 0345) Temp Source: Oral (10/17 0345) BP: 109/68 (10/17 0345) Pulse Rate: 57 (10/17 0345)  Labs: Recent Labs    06/09/19 1130 06/09/19 1400 06/09/19 2102 06/10/19 0505  HGB 16.6  --  16.3 15.5  HCT 50.6  --  48.4 47.9  PLT 161  --  138* 148*  CREATININE 0.87  --  0.96 0.78  TROPONINIHS  --  6  --   --     Estimated Creatinine Clearance: 64.2 mL/min (by C-G formula based on SCr of 0.78 mg/dL).   Medical History: Past Medical History:  Diagnosis Date  . Allergic rhinitis   . Anxiety   . Asthma   . BPH (benign prostatic hypertrophy)   . Colonic polyp   . Dyslipidemia   . ED (erectile dysfunction)   . GERD (gastroesophageal reflux disease)   . Inguinal hernia    Left S/P repair in 2012  . Leukoplakia of vocal cords   . OA (osteoarthritis)   . OSA (obstructive sleep apnea)    Does not use CPAP  . Reflux laryngitis    ENT ecal, PPIs twice a day  . Rosacea     Medications:  Medications Prior to Admission  Medication Sig Dispense Refill Last Dose  . acetaminophen (TYLENOL) 325 MG tablet Take 650 mg by mouth every 6 (six) hours as needed for headache (pain).   06/08/2019 at Unknown time  . albuterol (PROAIR HFA) 108 (90 Base) MCG/ACT inhaler Use 2 puffs every 4 hours as needed for cough and wheeze. May use 10-20 minutes before exercise (Patient taking differently: Inhale 2 puffs into the lungs every 4 (four) hours as needed for wheezing (cough or 10-20 minutes before exercise). ) 1 Inhaler 1 week ago  . ALPRAZolam (XANAX) 0.25 MG tablet Take 0.25 mg by mouth 2 (two) times  daily as needed for anxiety.   week ago  . Ascorbic Acid (VITAMIN C) 1000 MG tablet Take 1,000 mg by mouth every morning.   06/09/2019 at am  . budesonide (RHINOCORT ALLERGY) 32 MCG/ACT nasal spray Place 1 spray into both nostrils daily as needed for rhinitis.   4-5 days ago  . Cholecalciferol (VITAMIN D-3) 125 MCG (5000 UT) TABS Take 5,000 Units by mouth every morning.   06/09/2019 at am  . DEXILANT 60 MG capsule TAKE 1 CAPSULE (60 MG TOTAL) BY MOUTH EVERY MORNING. (Patient taking differently: Take 60 mg by mouth every morning. ) 30 capsule 0 06/09/2019 at am  . guaiFENesin-dextromethorphan (ROBITUSSIN DM) 100-10 MG/5ML syrup Take 10 mLs by mouth every 6 (six) hours as needed for cough. 118 mL 0 Past Week at Unknown time  . ibuprofen (ADVIL) 200 MG tablet Take 400 mg by mouth every 6 (six) hours as needed for headache (pain).   week ago  . Menaquinone-7 (VITAMIN K2 PO) Take 1 tablet by mouth every morning.    week ago  . OXYGEN Inhale into the lungs continuous.   06/09/2019 at Unknown time  . testosterone cypionate (DEPOTESTOSTERONE CYPIONATE) 200 MG/ML injection Inject 60 mg into the muscle once a week. 0.3 ml - 60  mg (inject into lateral thigh or buttock)   2 weeks ago  . valsartan-hydrochlorothiazide (DIOVAN-HCT) 320-25 MG tablet Take 1 tablet by mouth daily. (Patient taking differently: Take 1 tablet by mouth every morning. ) 90 tablet 1 06/09/2019 at am  . azelastine (ASTELIN) 0.1 % nasal spray PLACE 1 SPRAY INTO BOTH NOSTRILS 2 (TWO) TIMES DAILY. (Patient not taking: Reported on 06/09/2019) 30 mL 5 Not Taking at Unknown time  . fluticasone (FLONASE) 50 MCG/ACT nasal spray Place 1 spray into both nostrils 2 (two) times daily. (Patient not taking: Reported on 06/09/2019) 16 g 5 Not Taking at Unknown time  . Spacer/Aero-Holding Chambers (AEROCHAMBER PLUS FLO-VU LARGE) MISC 1 each by Other route once. 1 each 3     Assessment: 67 YOF with COVID-19 pneumonia readmitted to [facility]. Pharmacy consulted to  start Lovenox for VTE prophylaxis. D-dimer < 5. SCr wnl, H/H wnl. Plt low. Of note, patient received a dose of lovenox last night   Goal of Therapy:  VTE prophylaxis Monitor platelets by anticoagulation protocol: Yes   Plan:  -Lovenox 40 mg daily to start at 2200 tonight -Monitor CBC and renal fx -Pharmacy to sign off and monitor peripherally.   Albertina Parr, PharmD., BCPS Clinical Pharmacist Clinical phone for 06/10/19 until 5pm: [phone]

## 2019-06-10 NOTE — Evaluation (Signed)
Physical Therapy Evaluation Patient Details Name: Alejandro Hicks MRN: [id number] DOB: [birth year] Today's Date: 06/10/2019   History of Present Illness  85 y/o male w/ hx of HTN, asthma, GERD, former smoker, anxiety, OSA, OA, dislipedemia, BPH, anxiety, presented to ED w/ SOB cough and gen weakness was admitted to [facility] 10/03-10/13 w/ COVID PNA. he developed acute hypoxemic resp failure sec to pna and was given remdesivir and steroid and d/c home on 02. 10/16 presented back to ED w/ worsening dry cough, SOB, chest pain, decreased appetite, weakness and lethargy.  Clinical Impression   Pt re-admitted with above diagnosis. Pt was quite independent during last admission and was d/c home. He states his daughter would be person to help him if needed but she herself was just COVID +. Pt currently with functional limitations due to the deficits listed below (see PT Problem List). He has increased coughing with activity and is desating to low 80s on 4L/min via [place], read on pedi finger probe. Pt will benefit from skilled PT to increase their independence and safety with mobility to allow discharge to the venue listed below.       Follow Up Recommendations Other (comment)(home w/ HH vs rehab)    Equipment Recommendations  None recommended by PT    Recommendations for Other Services OT consult     Precautions / Restrictions Precautions Precautions: Fall Precaution Comments: monitor sats, coughs a lot and tolerates decreased activity Restrictions Weight Bearing Restrictions: No      Mobility  Bed Mobility Overal bed mobility: Modified Independent                Transfers Overall transfer level: Needs assistance Equipment used: None Transfers: Sit to/from Stand Sit to Stand: Supervision         General transfer comment: SBA and line management  Ambulation/Gait Ambulation/Gait assistance: Supervision Gait Distance (Feet): 20 Feet Assistive device: None Gait Pattern/deviations:  Step-through pattern     General Gait Details: did not ambulate far as wanted to see how coughin would affect mobility. He was able to complete distance with mnmal coughing but coughing increased once sitting in chair. Pt was on 4L/min via [place] and stayed on this throughout, noted desat to min 84% w/ activity  Stairs            Wheelchair Mobility    Modified Rankin (Stroke Patients Only)       Balance Overall balance assessment: Needs assistance Sitting-balance support: Feet supported Sitting balance-Leahy Scale: Good       Standing balance-Leahy Scale: Fair Standing balance comment: no support needed but sec to coughing w/ mobility balance is decreased                             Pertinent Vitals/Pain Pain Assessment: No/denies pain Pain Intervention(s): Limited activity within patient's tolerance    Home Living Family/patient expects to be discharged to:: Private residence Living Arrangements: Children Available Help at Discharge: Family Type of Home: House Home Access: Stairs to enter   Secretary/administrator of Steps: 1 Home Layout: Two level;Bed/bath upstairs Home Equipment: None      Prior Function Level of Independence: Independent               Hand Dominance        Extremity/Trunk Assessment   Upper Extremity Assessment Upper Extremity Assessment: Overall WFL for tasks assessed    Lower Extremity Assessment Lower Extremity Assessment: Overall WFL for  tasks assessed       Communication   Communication: No difficulties  Cognition Arousal/Alertness: Awake/alert Behavior During Therapy: WFL for tasks assessed/performed;Flat affect Overall Cognitive Status: Within Functional Limits for tasks assessed                                        General Comments General comments (skin integrity, edema, etc.): Pt returns to hospital sec to increased hypoxia and coughing with any mobility. Pt on 4L/min via [place] and is  at [facility] with all functional mobility, independence limited by poor activity tolerane and desaturation w/ activity. Desat to min 84% with standing and ambulating in room , able to recover to 90s within 39mins of task completion. coughing also increased with activity    Exercises Other Exercises Other Exercises: IS x 5 reps with cues Other Exercises: flutter valve x 5 reps w/ cues   Assessment/Plan    PT Assessment Patient needs continued PT services  PT Problem List Decreased strength;Decreased activity tolerance;Cardiopulmonary status limiting activity;Decreased balance;Decreased knowledge of use of DME       PT Treatment Interventions Gait training;Functional mobility training;Stair training;Therapeutic exercise;Therapeutic activities;Patient/family education    PT Goals (Current goals can be found in the Care Plan section)  Acute Rehab PT Goals Time For Goal Achievement: 06/24/19 Potential to Achieve Goals: Good    Frequency Min 3X/week   Barriers to discharge Other (comment) failed attempt at d/c to home enviroment, daughter also being sick with COVID and not being able to assist     Co-evaluation               AM-PAC PT "6 Clicks" Mobility  Outcome Measure Help needed turning from your back to your side while in a flat bed without using bedrails?: None Help needed moving from lying on your back to sitting on the side of a flat bed without using bedrails?: None Help needed moving to and from a bed to a chair (including a wheelchair)?: None Help needed standing up from a chair using your arms (e.g., wheelchair or bedside chair)?: A Little Help needed to walk in hospital room?: A Little Help needed climbing 3-5 steps with a railing? : A Lot 6 Click Score: 20    End of Session Equipment Utilized During Treatment: Oxygen Activity Tolerance: Patient limited by lethargy;Treatment limited secondary to medical complications (Comment) Patient left: in chair;with call  bell/phone within reach Nurse Communication: Mobility status PT Visit Diagnosis: Muscle weakness (generalized) (M62.81);Difficulty in walking, not elsewhere classified (R26.2)    Time: 1017-5102 PT Time Calculation (min) (ACUTE ONLY): 27 min   Charges:   PT Evaluation $PT Eval Moderate Complexity: 1 Mod PT Treatments $Therapeutic Activity: 8-22 mins        Alejandro Hicks, PT    Delford Field 06/10/2019, 1:58 PM

## 2019-06-10 NOTE — Plan of Care (Signed)
New admit, re admit less than 30 days

## 2019-06-11 LAB — CBC WITH DIFFERENTIAL/PLATELET
Abs Immature Granulocytes: 0.06 10*3/uL (ref 0.00–0.07)
Basophils Absolute: 0 10*3/uL (ref 0.0–0.1)
Basophils Relative: 0 %
Eosinophils Absolute: 0 10*3/uL (ref 0.0–0.5)
Eosinophils Relative: 0 %
HCT: 46.9 % (ref 39.0–52.0)
Hemoglobin: 15.2 g/dL (ref 13.0–17.0)
Immature Granulocytes: 1 %
Lymphocytes Relative: 5 %
Lymphs Abs: 0.6 10*3/uL — ABNORMAL LOW (ref 0.7–4.0)
MCH: 28.4 pg (ref 26.0–34.0)
MCHC: 32.4 g/dL (ref 30.0–36.0)
MCV: 87.5 fL (ref 80.0–100.0)
Monocytes Absolute: 0.9 10*3/uL (ref 0.1–1.0)
Monocytes Relative: 8 %
Neutro Abs: 10.6 10*3/uL — ABNORMAL HIGH (ref 1.7–7.7)
Neutrophils Relative %: 86 %
Platelets: 153 10*3/uL (ref 150–400)
RBC: 5.36 MIL/uL (ref 4.22–5.81)
RDW: 13.7 % (ref 11.5–15.5)
WBC: 12.2 10*3/uL — ABNORMAL HIGH (ref 4.0–10.5)
nRBC: 0 % (ref 0.0–0.2)

## 2019-06-11 LAB — COMPREHENSIVE METABOLIC PANEL
ALT: 40 U/L (ref 0–44)
AST: 27 U/L (ref 15–41)
Albumin: 2.3 g/dL — ABNORMAL LOW (ref 3.5–5.0)
Alkaline Phosphatase: 65 U/L (ref 38–126)
Anion gap: 6 (ref 5–15)
BUN: 33 mg/dL — ABNORMAL HIGH (ref 8–23)
CO2: 25 mmol/L (ref 22–32)
Calcium: 8.3 mg/dL — ABNORMAL LOW (ref 8.9–10.3)
Chloride: 107 mmol/L (ref 98–111)
Creatinine, Ser: 0.86 mg/dL (ref 0.61–1.24)
GFR calc Af Amer: >60 mL/min (ref >60)
GFR calc non Af Amer: >60 mL/min (ref >60)
Glucose, Bld: 156 mg/dL — ABNORMAL HIGH (ref 70–99)
Potassium: 4.7 mmol/L (ref 3.5–5.1)
Sodium: 138 mmol/L (ref 135–145)
Total Bilirubin: 0.3 mg/dL (ref 0.3–1.2)
Total Protein: 5.3 g/dL — ABNORMAL LOW (ref 6.5–8.1)

## 2019-06-11 LAB — RESPIRATORY PANEL BY PCR

## 2019-06-11 LAB — C-REACTIVE PROTEIN: CRP: 3.9 mg/dL — ABNORMAL HIGH (ref <1.0)

## 2019-06-11 LAB — PROCALCITONIN: Procalcitonin: <0.1 ng/mL

## 2019-06-11 LAB — BRAIN NATRIURETIC PEPTIDE: B Natriuretic Peptide: 60 pg/mL (ref 0.0–100.0)

## 2019-06-11 LAB — MAGNESIUM: Magnesium: 2 mg/dL (ref 1.7–2.4)

## 2019-06-11 MED ORDER — FUROSEMIDE 10 MG/ML IJ SOLN
40.0000 mg | Freq: Once | INTRAMUSCULAR | Status: AC
  Administered 2019-06-11: 11:00:00 40 mg via INTRAVENOUS
  Filled 2019-06-11: qty 4

## 2019-06-11 MED ORDER — DOXYCYCLINE HYCLATE 100 MG PO TABS
100.0000 mg | ORAL_TABLET | Freq: Two times a day (BID) | ORAL | Status: AC
  Administered 2019-06-11 – 2019-06-12 (×4): 100 mg via ORAL
  Filled 2019-06-11 (×4): qty 1

## 2019-06-11 NOTE — Progress Notes (Signed)
PROGRESS NOTE                                                                                                                                                                                                             Patient Demographics:    Alejandro Hicks, is a 85 y.o. male, DOB - [birth year], GLO:[id number]  Outpatient Primary MD for the patient is Wenda Low, MD    LOS - 2  Admit date - 06/09/2019    Chief Complaint  Patient presents with  . covid positive  . Shortness of Breath       Brief Narrative  - 85 y.o. male with medical history significant of hypertension, asthma, GERD, former smoker, anxiety recently admitted and treated for COVID-19 pneumonia and discharged home after a prolonged hospital stay comes back soon after discharge with shortness of breath at home.  He was discharged on home oxygen but he never learned how to use it, he now says that he was unable to care for himself at the house and he should have gone to SNF where we were recommending him to go but he refused.  When he came to the ER second time he was on room air and hypoxic, he was placed on 3 L nasal cannula oxygen with resolution of his symptoms.  His blood work did show mild rise in CRP as compared to what he was before other than that unremarkable work-up including CT scan chest which ruled out PE.   Subjective:    [name] today has, No headache, No chest pain, No abdominal pain - No Nausea, No new weakness tingling or numbness, improved cough and shortness of breath   Assessment  & Plan :   1.  Acute Hypoxic Resp. Failure due to Acute Covid 19 Viral Pneumonitis during the ongoing 2020 Covid 19 Pandemic - his pneumonitis and respiratory failure is stable as it was at the time of discharge, patient was unable to take care of himself at home and unable to use provided home oxygen.  He refused SNF placement last time and he says he made a  grave mistake and will use it now.  He was adequately treated last time with prolonged steroid use and Remdisvir.  He was diagnosed with COVID-19 infection on 05/27/2019 initially.  This admission his CT angiogram chest is unremarkable,  since his CRP is elevated I have placed him on IV steroids which he will continue for a a few days and subsequently on a oral steroid taper.  His physical exam is stable he is stable on 1 L nasal cannula oxygen.  Possibility of mild bronchitis.  Placed on doxycycline and switched to PO.  Monitor sputum Gram stain culture.  Procalcitonin is negative.  However I do not think he can care for himself at home as his son and daughter-in-law who live with him both have COVID-19 infection and are pretty sick.  He clearly failed home discharge.  Will provide him supportive care, PT OT eval and look for SNF discharge.   COVID-19 Labs  Recent Labs    06/09/19 1400 06/10/19 0505 06/11/19 0300  DDIMER 2.26* 1.43*  --   FERRITIN  --  154  --   CRP 11.7* 10.9* 3.9*    Lab Results  Component Value Date   SARSCOV2NAA POSITIVE (A) 06/09/2019     SpO2: 92 % O2 Flow Rate (L/min): 3 L/min      Component Value Date/Time   BNP 60.0 06/11/2019 0300     2.  GERD.  PPI.  3.  Dehydration with deconditioning.  IV fluids and PT OT.  4.  Hypertension.  Currently dehydrated blood pressure low.  Hydrate and hold blood pressure medications.  5.  Few rales - trial of IV lasix. No CHF.  6. Mild transaminitis.  COVID-19 related and recent use of Remdisvir.  Trend is stable and resolving.  Symptom-free.     Hepatic Function Latest Ref Rng & Units 06/11/2019 06/10/2019 06/09/2019  Total Protein 6.5 - 8.1 g/dL 5.3(L) 5.6(L) 5.9(L)  Albumin 3.5 - 5.0 g/dL 2.3(L) 2.5(L) 2.6(L)  AST 15 - 41 U/L 27 28 38  ALT 0 - 44 U/L 40 44 57(H)  Alk Phosphatase 38 - 126 U/L 65 64 71  Total Bilirubin 0.3 - 1.2 mg/dL 0.3 1.2 1.3(H)     Condition - Fair  Family Communication  :   None  Code Status :  Full  Diet :   Diet Order            Diet 2 gram sodium Room service appropriate? Yes; Fluid consistency: Thin  Diet effective now               Disposition Plan  :  SNF  Consults  :  None  Procedures  :   CTA - Non acute  PUD Prophylaxis :  PPI  DVT Prophylaxis  :  Lovenox added  Lab Results  Component Value Date   PLT 153 06/11/2019    Inpatient Medications  Scheduled Meds: . cholecalciferol  5,000 Units Oral q morning - 10a  . enoxaparin (LOVENOX) injection  40 mg Subcutaneous Q24H  . methylPREDNISolone (SOLU-MEDROL) injection  60 mg Intravenous Daily  . pantoprazole  40 mg Oral Daily  . polyethylene glycol  17 g Oral Daily  . vitamin C  1,000 mg Oral q morning - 10a   Continuous Infusions: . doxycycline (VIBRAMYCIN) IV Stopped (06/10/19 2000)   PRN Meds:.acetaminophen, albuterol, ALPRAZolam, chlorpheniramine-HYDROcodone, hydrALAZINE, nitroGLYCERIN, [DISCONTINUED] ondansetron **OR** ondansetron (ZOFRAN) IV  Antibiotics  :    Anti-infectives (From admission, onward)   Start     Dose/Rate Route Frequency Ordered Stop   06/09/19 1930  doxycycline (VIBRAMYCIN) 100 mg in sodium chloride 0.9 % 250 mL IVPB     100 mg 125 mL/hr over 120 Minutes Intravenous Every 12 hours  06/09/19 1623         Time Spent in minutes  30   Lala Lund M.D on 06/11/2019 at 8:56 AM  To page go to [email or web address] - password [facility]  [facility] -  Office  [phone]   See all Orders from today for further details    Objective:   Vitals:   06/10/19 1908 06/10/19 2105 06/11/19 0810 06/11/19 0853  BP: (!) 160/66 (!) 152/73 140/86   Pulse: 71 77 62   Resp: (!) 24 20 18    Temp: 98.1 F (36.7 C) 98 F (36.7 C) 97.9 F (36.6 C)   TempSrc: Oral Oral Oral   SpO2: 93% 91% 96% 92%  Weight:      Height:        Wt Readings from Last 3 Encounters:  06/09/19 72.6 kg  05/28/19 72.4 kg  05/27/19 72.6 kg     Intake/Output Summary (Last 24  hours) at 06/11/2019 0856 Last data filed at 06/11/2019 0500 Gross per 24 hour  Intake 1176.02 ml  Output -  Net 1176.02 ml     Physical Exam  Awake Alert,  No new F.N deficits, Normal affect [place].AT,PERRAL Supple Neck,No JVD, No cervical lymphadenopathy appriciated.  Symmetrical Chest wall movement, Good air movement bilaterally, few rales RRR,No Gallops, Rubs or new Murmurs, No Parasternal Heave +ve B.Sounds, Abd Soft, No tenderness, No organomegaly appriciated, No rebound - guarding or rigidity. No Cyanosis, Clubbing or edema, No new Rash or bruise      Data Review:    CBC Recent Labs  Lab 06/05/19 0500 06/06/19 0130 06/09/19 1130 06/09/19 2102 06/10/19 0505 06/11/19 0300  WBC 12.3* 10.9* 14.2* 11.0* 7.5 12.2*  HGB 16.9 15.1 16.6 16.3 15.5 15.2  HCT 53.3* 47.0 50.6 48.4 47.9 46.9  PLT 217 199 161 138* 148* 153  MCV 89.0 88.0 88.2 86.9 87.1 87.5  MCH 28.2 28.3 28.9 29.3 28.2 28.4  MCHC 31.7 32.1 32.8 33.7 32.4 32.4  RDW 14.2 14.0 13.7 13.6 13.6 13.7  LYMPHSABS 0.6* 0.4* 0.4*  --  0.2* 0.6*  MONOABS 0.7 0.6 0.8  --  0.1 0.9  EOSABS 0.0 0.0 0.2  --  0.0 0.0  BASOSABS 0.1 0.0 0.0  --  0.0 0.0    Chemistries  Recent Labs  Lab 06/05/19 0500 06/06/19 0130 06/09/19 1130 06/09/19 2102 06/10/19 0505 06/11/19 0300  NA 137 135 134*  --  138 138  K 4.4 4.9 4.3  --  4.7 4.7  CL 99 101 101  --  106 107  CO2 24 25 22   --  25 25  GLUCOSE 134* 139* 90  --  127* 156*  BUN 34* 31* 22  --  23 33*  CREATININE 0.94 0.97 0.87 0.96 0.78 0.86  CALCIUM 8.2* 8.0* 7.9*  --  7.8* 8.3*  MG  --   --   --   --  2.3 2.0  AST 57* 45* 38  --  28 27  ALT 99* 85* 57*  --  44 40  ALKPHOS 71 63 71  --  64 65  BILITOT 0.7 0.7 1.3*  --  1.2 0.3   ------------------------------------------------------------------------------------------------------------------ No results for input(s): CHOL, HDL, LDLCALC, TRIG, CHOLHDL, LDLDIRECT in the last 72 hours.  No results found for:  HGBA1C ------------------------------------------------------------------------------------------------------------------ No results for input(s): TSH, T4TOTAL, T3FREE, THYROIDAB in the last 72 hours.  Invalid input(s): FREET3  Cardiac Enzymes No results for input(s): CKMB, TROPONINI, MYOGLOBIN in the last 168 hours.  Invalid  input(s): CK ------------------------------------------------------------------------------------------------------------------    Component Value Date/Time   BNP 60.0 06/11/2019 0300    Micro Results Recent Results (from the past 240 hour(s))  SARS CORONAVIRUS 2 (TAT 6-24 HRS) Nasopharyngeal Nasopharyngeal Swab     Status: Abnormal   Collection Time: 06/09/19  1:20 PM   Specimen: Nasopharyngeal Swab  Result Value Ref Range Status   SARS Coronavirus 2 POSITIVE (A) NEGATIVE Final    Comment: CRITICAL RESULT CALLED TO, READ BACK BY AND VERIFIED WITH: S.GRINDSTAFF RN 1950 06/09/2019 MCCORMICK K (NOTE) SARS-CoV-2 target nucleic acids are DETECTED. The SARS-CoV-2 RNA is generally detectable in upper and lower respiratory specimens during the acute phase of infection. Positive results are indicative of active infection with SARS-CoV-2. Clinical  correlation with patient history and other diagnostic information is necessary to determine patient infection status. Positive results do  not rule out bacterial infection or co-infection with other viruses. The expected result is Negative. Fact Sheet for Patients: SugarRoll.be Fact Sheet for Healthcare Providers: https://www.woods-mathews.com/ This test is not yet approved or cleared by the [place] FDA and  has been authorized for detection and/or diagnosis of SARS-CoV-2 by FDA under an Emergency Use Authorization (EUA). This EUA will remain  in effect (meaning this test c an be used) for the duration of the COVID-19 declaration under Section 564(b)(1) of the Act, 21  U.S.C. section 360bbb-3(b)(1), unless the authorization is terminated or revoked sooner. Performed at [facility], [place] [address] [place], [place] [address]   Respiratory Panel by PCR     Status: None   Collection Time: 06/10/19  3:18 AM   Specimen: Nasopharyngeal Swab; Respiratory  Result Value Ref Range Status   Adenovirus NOT DETECTED NOT DETECTED Final   Coronavirus 229E NOT DETECTED NOT DETECTED Final    Comment: (NOTE) The Coronavirus on the Respiratory Panel, DOES NOT test for the novel  Coronavirus (2019 nCoV)    Coronavirus HKU1 NOT DETECTED NOT DETECTED Final   Coronavirus NL63 NOT DETECTED NOT DETECTED Final   Coronavirus OC43 NOT DETECTED NOT DETECTED Final   Metapneumovirus NOT DETECTED NOT DETECTED Final   Rhinovirus / Enterovirus NOT DETECTED NOT DETECTED Final   Influenza A NOT DETECTED NOT DETECTED Final   Influenza B NOT DETECTED NOT DETECTED Final   Parainfluenza Virus 1 NOT DETECTED NOT DETECTED Final   Parainfluenza Virus 2 NOT DETECTED NOT DETECTED Final   Parainfluenza Virus 3 NOT DETECTED NOT DETECTED Final   Parainfluenza Virus 4 NOT DETECTED NOT DETECTED Final   Respiratory Syncytial Virus NOT DETECTED NOT DETECTED Final   Bordetella pertussis NOT DETECTED NOT DETECTED Final   Chlamydophila pneumoniae NOT DETECTED NOT DETECTED Final   Mycoplasma pneumoniae NOT DETECTED NOT DETECTED Final    Comment: Performed at [facility], [place]. [address] [place], [place] [address]    Radiology Reports Dg Chest 2 View  Result Date: 05/27/2019 CLINICAL DATA:  Cough, covid EXAM: CHEST - 2 VIEW COMPARISON:  October 04, 2017 FINDINGS: The heart size and mediastinal contours are within normal limits. There is mildly increased hazy airspace opacity seen within the periphery of the right upper lung and the left lower lung. Aortic knob calcifications are seen. No acute osseous abnormality. IMPRESSION: Hazy peripheral airspace opacity is within both lungs. The  findings in the lungs are nonspecific, but concerning for atypical infection, which includes viral pneumonia. Electronically Signed   By: Prudencio Pair M.D.   On: 05/27/2019 21:23   Ct Angio Chest Pe  W Or Wo Contrast  Result Date: 06/09/2019 CLINICAL DATA:  PE suspected, high pretest probability, COVID-19 positive 1 week prior currently on 3 L home O2 EXAM: CT ANGIOGRAPHY CHEST WITH CONTRAST TECHNIQUE: Multidetector CT imaging of the chest was performed using the standard protocol during bolus administration of intravenous contrast. Multiplanar CT image reconstructions and MIPs were obtained to evaluate the vascular anatomy. CONTRAST:  147m OMNIPAQUE IOHEXOL 350 MG/ML SOLN COMPARISON:  Radiograph 06/09/2019 FINDINGS: Cardiovascular: Satisfactory opacification of the pulmonary arteries to the segmental level. No evidence of pulmonary embolism. Normal heart size. No pericardial effusion. Atherosclerotic calcification of the coronary arteries. Atherosclerotic plaque within the normal caliber aorta. Normal 3 vessel branching of the aortic arch. No evident abnormality of proximal great vessels. Mediastinum/Nodes: Prominent low-attenuation mediastinal nodes are likely reactive. No pathologically enlarged mediastinal, hilar axillary adenopathy. Lungs/Pleura: There are multifocal areas airspace consolidation with surrounding patchy areas of ground-glass opacity and intervening air bronchograms. No pneumothorax or effusion. Some atelectatic volume loss is noted in the left lung. Upper Abdomen: Post cholecystectomy. No acute abnormalities present in the visualized portions of the upper abdomen. Musculoskeletal: Multilevel degenerative changes are present in the imaged portions of the spine. No acute osseous abnormality or suspicious osseous lesion. Mild body wall edema. Mild right gynecomastia. Review of the MIP images confirms the above findings. IMPRESSION: 1. No evidence of pulmonary embolism. 2. Multifocal areas of  airspace consolidation with surrounding patchy areas of ground-glass opacity and intervening air bronchograms, compatible with multifocal pneumonia including COVID-19 etiology. 3. Aortic Atherosclerosis (ICD10-I70.0). Electronically Signed   By: PLovena LeM.D.   On: 06/09/2019 18:20   Dg Chest Port 1 View  Result Date: 06/09/2019 CLINICAL DATA:  Cough, COVID-19 positive EXAM: PORTABLE CHEST 1 VIEW COMPARISON:  06/02/2019 FINDINGS: Stable cardiomediastinal contours. Significant interval progression of multifocal bilateral airspace consolidations, with confluent opacities most pronounced in the right upper lobe and left lower lobes. No pleural effusion or pneumothorax. IMPRESSION: Significant interval progression of multifocal pneumonia compared to prior. Electronically Signed   By: NDavina PokeM.D.   On: 06/09/2019 12:43   Dg Chest Port 1v Same Day  Result Date: 06/02/2019 CLINICAL DATA:  Shortness of breath EXAM: PORTABLE CHEST 1 VIEW COMPARISON:  Six days ago FINDINGS: Patchy bilateral pneumonia with mild increase. Normal heart size accounting for elevated right diaphragm and technique. No edema, effusion, or pneumothorax IMPRESSION: COVID-19 with patchy bilateral pneumonia that is more conspicuous than on prior. Electronically Signed   By: JMonte FantasiaM.D.   On: 06/02/2019 08:55

## 2019-06-11 NOTE — Evaluation (Addendum)
Occupational Therapy Evaluation Patient Details Name: Alejandro Hicks MRN: [id number] DOB: 12-Feb-1934 Today's Date: 06/11/2019    History of Present Illness 85 y/o male w/ hx of HTN, asthma, GERD, former smoker, anxiety, OSA, OA, dislipedemia, BPH, anxiety, presented to ED w/ SOB cough and gen weakness was admitted to [facility] 10/03-10/13 w/ COVID PNA. he developed acute hypoxemic resp failure sec to pna and was given remdesivir and steroid and d/c home on 02. 10/16 presented back to ED w/ worsening dry cough, SOB, chest pain, decreased appetite, weakness and lethargy.   Clinical Impression   This 85 y/o male presents with the above. Pt with recent admit to [facility] and d/c home, now readmitted. Pt up and mobilizing to bathroom upon arrival to room, without O2 sensor donned. Pt on 1L initially increased to 2L during activity, once able to reattach O2 sensor noted SpO2 decreased to 77%, cued pt for deep breathing while sitting with O2 sats quickly rebounding to low 80s, returning to 89/90% within approx 2 min; DOE 3/4 with room level activity. Pt mobilizing and performing ADL tasks overall at supervision-minguard assist, though largely limited due to increased WOB and decreased activity tolerance at this time. Pt lives with children who are also Covid+, he reports daughter feeling better and able to assist at home but unsure to full extent she can assist. He will benefit from continued acute OT services and recommend post acute therapies (SNF vs home with [facility], pending family assist and pt progress). Will continue to follow.     Follow Up Recommendations  Supervision/Assistance - 24 hour;SNF    Equipment Recommendations  Other (comment)(to be further assessed)           Precautions / Restrictions Precautions Precautions: Fall Precaution Comments: monitor sats Restrictions Weight Bearing Restrictions: No      Mobility Bed Mobility               General bed mobility comments: received  OOB  Transfers Overall transfer level: Needs assistance Equipment used: None Transfers: Sit to/from Stand Sit to Stand: Supervision              Balance Overall balance assessment: Needs assistance Sitting-balance support: Feet supported Sitting balance-Leahy Scale: Good       Standing balance-Leahy Scale: Fair Standing balance comment: pt bending to pick up napkin on floor with supervision for safety                           ADL either performed or assessed with clinical judgement   ADL Overall ADL's : Needs assistance/impaired Eating/Feeding: Modified independent;Sitting   Grooming: Min guard;Standing   Upper Body Bathing: Set up;Sitting   Lower Body Bathing: Min guard;Sit to/from stand   Upper Body Dressing : Set up;Sitting   Lower Body Dressing: Min guard;Sit to/from stand   Toilet Transfer: Min guard;Supervision/safety;Ambulation;Regular Teacher, adult education Details (indicate cue type and reason): pt up to bathroom upon arrival to room Toileting- Clothing Manipulation and Hygiene: Supervision/safety;Sit to/from stand;Sitting/lateral lean Toileting - Clothing Manipulation Details (indicate cue type and reason): clothing management (gown and underwear) and pericare     Functional mobility during ADLs: Min guard;Supervision/safety General ADL Comments: pt with decreased activity tolerance and increased WOB with activity; initiated education on energy conservation strategies during ADL tasks and handout issued. will benefit from continued review/reinforcement  Pertinent Vitals/Pain Pain Assessment: No/denies pain     Hand Dominance     Extremity/Trunk Assessment Upper Extremity Assessment Upper Extremity Assessment: Overall WFL for tasks assessed   Lower Extremity Assessment Lower Extremity Assessment: Overall WFL for tasks assessed;Defer to PT evaluation   Cervical / Trunk Assessment Cervical / Trunk  Assessment: Normal   Communication Communication Communication: No difficulties   Cognition Arousal/Alertness: Awake/alert Behavior During Therapy: WFL for tasks assessed/performed;Flat affect Overall Cognitive Status: Within Functional Limits for tasks assessed                                     General Comments       Exercises Exercises: Other exercises;General Upper Extremity General Exercises - Upper Extremity Shoulder Flexion: AROM;Both;10 reps;Theraband Theraband Level (Shoulder Flexion): Level 1 (Yellow) Shoulder Horizontal ABduction: AROM;Both;10 reps;Theraband Theraband Level (Shoulder Horizontal Abduction): Level 1 (Yellow) Shoulder Horizontal ADduction: AROM;Both;10 reps;Theraband Theraband Level (Shoulder Horizontal Adduction): Level 1 (Yellow) Elbow Flexion: AROM;Both;10 reps;Theraband Theraband Level (Elbow Flexion): Level 1 (Yellow) Elbow Extension: PROM;Both;10 reps;Theraband Theraband Level (Elbow Extension): Level 1 (Yellow) Other Exercises Other Exercises: IS x 3 reps with cues   Shoulder Instructions      Home Living Family/patient expects to be discharged to:: Private residence Living Arrangements: Children Available Help at Discharge: Family Type of Home: House Home Access: Stairs to enter Technical brewer of Steps: 1   Home Layout: Two level;Bed/bath upstairs;Able to live on main level with bedroom/bathroom Alternate Level Stairs-Number of Steps: 17 Alternate Level Stairs-Rails: Right;Left Bathroom Shower/Tub: Occupational psychologist: Standard     Home Equipment: Shower seat - built in          Prior Functioning/Environment Level of Independence: Independent        Comments: reports increased difficulty with mobility/ADL since recent d/c home and readmission        OT Problem List: Decreased strength;Decreased knowledge of use of DME or AE;Decreased activity tolerance;Cardiopulmonary status limiting  activity;Decreased knowledge of precautions      OT Treatment/Interventions: Self-care/ADL training;Therapeutic exercise;DME and/or AE instruction;Therapeutic activities;Patient/family education;Balance training;Energy conservation    OT Goals(Current goals can be found in the care plan section) Acute Rehab OT Goals Patient Stated Goal: go home safely OT Goal Formulation: With patient Time For Goal Achievement: 06/25/19 Potential to Achieve Goals: Good  OT Frequency: Min 2X/week(vs 3x/wk (pending home vs SNF))   Barriers to D/C:            Co-evaluation              AM-PAC OT "6 Clicks" Daily Activity     Outcome Measure Help from another person eating meals?: None Help from another person taking care of personal grooming?: A Little Help from another person toileting, which includes using toliet, bedpan, or urinal?: A Little Help from another person bathing (including washing, rinsing, drying)?: A Little Help from another person to put on and taking off regular upper body clothing?: None Help from another person to put on and taking off regular lower body clothing?: A Little 6 Click Score: 20   End of Session Equipment Utilized During Treatment: Oxygen Nurse Communication: Mobility status  Activity Tolerance: Patient tolerated treatment well Patient left: in chair;with call bell/phone within reach  OT Visit Diagnosis: Muscle weakness (generalized) (M62.81);Other (comment)(decreased activity tolerance)                Time: 5329-9242  OT Time Calculation (min): 27 min Charges:  OT General Charges $OT Visit: 1 Visit OT Evaluation $OT Eval Moderate Complexity: 1 Mod OT Treatments $Self Care/Home Management : 8-22 mins  Marcy SirenBreanna Knut Rondinelli, OT Supplemental Rehabilitation Services Pager [phone] Office [phone]   Alejandro Hicks 06/11/2019, 3:37 PM

## 2019-06-12 LAB — COMPREHENSIVE METABOLIC PANEL
ALT: 42 U/L (ref 0–44)
AST: 29 U/L (ref 15–41)
Albumin: 2.6 g/dL — ABNORMAL LOW (ref 3.5–5.0)
Alkaline Phosphatase: 65 U/L (ref 38–126)
Anion gap: 9 (ref 5–15)
BUN: 33 mg/dL — ABNORMAL HIGH (ref 8–23)
CO2: 27 mmol/L (ref 22–32)
Calcium: 8.3 mg/dL — ABNORMAL LOW (ref 8.9–10.3)
Chloride: 101 mmol/L (ref 98–111)
Creatinine, Ser: 0.78 mg/dL (ref 0.61–1.24)
GFR calc Af Amer: >60 mL/min (ref >60)
GFR calc non Af Amer: >60 mL/min (ref >60)
Glucose, Bld: 93 mg/dL (ref 70–99)
Potassium: 4.7 mmol/L (ref 3.5–5.1)
Sodium: 137 mmol/L (ref 135–145)
Total Bilirubin: 0.5 mg/dL (ref 0.3–1.2)
Total Protein: 5.4 g/dL — ABNORMAL LOW (ref 6.5–8.1)

## 2019-06-12 LAB — CBC WITH DIFFERENTIAL/PLATELET
Abs Immature Granulocytes: 0.03 10*3/uL (ref 0.00–0.07)
Basophils Absolute: 0 10*3/uL (ref 0.0–0.1)
Basophils Relative: 0 %
Eosinophils Absolute: 0 10*3/uL (ref 0.0–0.5)
Eosinophils Relative: 0 %
HCT: 46.2 % (ref 39.0–52.0)
Hemoglobin: 14.8 g/dL (ref 13.0–17.0)
Immature Granulocytes: 0 %
Lymphocytes Relative: 7 %
Lymphs Abs: 0.6 10*3/uL — ABNORMAL LOW (ref 0.7–4.0)
MCH: 28.1 pg (ref 26.0–34.0)
MCHC: 32 g/dL (ref 30.0–36.0)
MCV: 87.8 fL (ref 80.0–100.0)
Monocytes Absolute: 0.9 10*3/uL (ref 0.1–1.0)
Monocytes Relative: 10 %
Neutro Abs: 7.8 10*3/uL — ABNORMAL HIGH (ref 1.7–7.7)
Neutrophils Relative %: 83 %
Platelets: 142 10*3/uL — ABNORMAL LOW (ref 150–400)
RBC: 5.26 MIL/uL (ref 4.22–5.81)
RDW: 14 % (ref 11.5–15.5)
WBC: 9.4 10*3/uL (ref 4.0–10.5)
nRBC: 0 % (ref 0.0–0.2)

## 2019-06-12 LAB — PROCALCITONIN: Procalcitonin: <0.1 ng/mL

## 2019-06-12 LAB — BRAIN NATRIURETIC PEPTIDE: B Natriuretic Peptide: 26.2 pg/mL (ref 0.0–100.0)

## 2019-06-12 LAB — MAGNESIUM: Magnesium: 2.1 mg/dL (ref 1.7–2.4)

## 2019-06-12 LAB — C-REACTIVE PROTEIN: CRP: 1.5 mg/dL — ABNORMAL HIGH (ref <1.0)

## 2019-06-12 MED ORDER — METHYLPREDNISOLONE SODIUM SUCC 40 MG IJ SOLR
40.0000 mg | Freq: Every day | INTRAMUSCULAR | Status: DC
  Administered 2019-06-12: 40 mg via INTRAVENOUS
  Filled 2019-06-12: qty 1

## 2019-06-12 MED ORDER — AMLODIPINE BESYLATE 10 MG PO TABS
10.0000 mg | ORAL_TABLET | Freq: Every day | ORAL | Status: DC
  Administered 2019-06-12 – 2019-06-22 (×11): 10 mg via ORAL
  Filled 2019-06-12 (×11): qty 1

## 2019-06-12 MED ORDER — FUROSEMIDE 10 MG/ML IJ SOLN
40.0000 mg | Freq: Once | INTRAMUSCULAR | Status: AC
  Administered 2019-06-12: 40 mg via INTRAVENOUS
  Filled 2019-06-12: qty 4

## 2019-06-12 NOTE — Progress Notes (Signed)
PROGRESS NOTE                                                                                                                                                                                                             Patient Demographics:    Alejandro Hicks, is a 85 y.o. male, DOB - 31-May-1934, CLE:[id number]  Outpatient Primary MD for the patient is Wenda Low, MD    LOS - 3  Admit date - 06/09/2019    Chief Complaint  Patient presents with  . covid positive  . Shortness of Breath       Brief Narrative  - 85 y.o. male with medical history significant of hypertension, asthma, GERD, former smoker, anxiety recently admitted and treated for COVID-19 pneumonia and discharged home after a prolonged hospital stay comes back soon after discharge with shortness of breath at home.  He was discharged on home oxygen but he never learned how to use it, he now says that he was unable to care for himself at the house and he should have gone to SNF where we were recommending him to go but he refused.  When he came to the ER second time he was on room air and hypoxic, he was placed on 3 L nasal cannula oxygen with resolution of his symptoms.  His blood work did show mild rise in CRP as compared to what he was before other than that unremarkable work-up including CT scan chest which ruled out PE.   Subjective:   Patient in bed, appears comfortable, denies any headache, no fever, no chest pain or pressure, +ve cough, no shortness of breath at rest, no abdominal pain. No focal weakness.    Assessment  & Plan :   1.  Acute Hypoxic Resp. Failure due to Acute Covid 19 Viral Pneumonitis during the ongoing 2020 Covid 19 Pandemic - his pneumonitis and respiratory failure is stable as it was at the time of discharge, patient was unable to take care of himself at home and unable to use provided home oxygen.  He  refused SNF placement last time and he says he made a grave mistake and will  use it now.  He was adequately treated last time with prolonged steroid use and Remdisvir.  He was diagnosed with COVID-19 infection on 05/27/2019 initially.  This admission his CT angiogram chest is unremarkable, since his CRP is elevated I have placed him on IV steroids which he will continue for a a few days and subsequently on a oral steroid taper.  His physical exam is stable he is stable on 1.5 L nasal cannula oxygen.  There is an underlying possibility of mild bronchitis.  Placed on doxycycline and switched to PO x 3 days.  Respiratory viral panel negative.  Monitor sputum Gram stain culture.  Procalcitonin is negative.  However I do not think he can care for himself at home as his son and daughter-in-law who live with him both have COVID-19 infection and are pretty sick.  He clearly failed home discharge.  Will provide him supportive care, PT OT eval and look for SNF discharge.   COVID-19 Labs  Recent Labs    06/09/19 1400 06/10/19 0505 06/11/19 0300 06/12/19 0320  DDIMER 2.26* 1.43*  --   --   FERRITIN  --  154  --   --   CRP 11.7* 10.9* 3.9* 1.5*    Lab Results  Component Value Date   SARSCOV2NAA POSITIVE (A) 06/09/2019     SpO2: 96 % O2 Flow Rate (L/min): 1.5 L/min FiO2 (%): (!) 4 %      Component Value Date/Time   BNP 26.2 06/12/2019 0320     2.  GERD.  PPI.  3.  Dehydration with deconditioning.  Has been treated with IV fluids.  4.  Hypertension.  Blood pressure is improved low-dose Norvasc will be added.  5.  Few rales - trial of IV lasix. No CHF.  6. Mild transaminitis.  COVID-19 related and recent use of Remdisvir.  Trend is stable and resolving.  Symptom-free.     Hepatic Function Latest Ref Rng & Units 06/12/2019 06/11/2019 06/10/2019  Total Protein 6.5 - 8.1 g/dL 5.4(L) 5.3(L) 5.6(L)  Albumin 3.5 - 5.0 g/dL 2.6(L) 2.3(L) 2.5(L)  AST 15 - 41 U/L 29 27 28   ALT 0 - 44 U/L 42  40 44  Alk Phosphatase 38 - 126 U/L 65 65 64  Total Bilirubin 0.3 - 1.2 mg/dL 0.5 0.3 1.2     Condition - Fair  Family Communication  :  None  Code Status :  Full  Diet :   Diet Order            Diet 2 gram sodium Room service appropriate? Yes; Fluid consistency: Thin  Diet effective now               Disposition Plan  :  SNF  Consults  :  None  Procedures  :   CTA - Non acute  PUD Prophylaxis :  PPI  DVT Prophylaxis  :  Lovenox added  Lab Results  Component Value Date   PLT 142 (L) 06/12/2019    Inpatient Medications  Scheduled Meds: . cholecalciferol  5,000 Units Oral q morning - 10a  . doxycycline  100 mg Oral Q12H  . enoxaparin (LOVENOX) injection  40 mg Subcutaneous Q24H  . methylPREDNISolone (SOLU-MEDROL) injection  40 mg Intravenous Daily  . pantoprazole  40 mg Oral Daily  . polyethylene glycol  17 g Oral Daily  . vitamin C  1,000 mg Oral q morning - 10a   Continuous Infusions:  PRN Meds:.acetaminophen, albuterol, ALPRAZolam, chlorpheniramine-HYDROcodone, hydrALAZINE, nitroGLYCERIN, [DISCONTINUED]  ondansetron **OR** ondansetron (ZOFRAN) IV  Antibiotics  :    Anti-infectives (From admission, onward)   Start     Dose/Rate Route Frequency Ordered Stop   06/11/19 1000  doxycycline (VIBRA-TABS) tablet 100 mg     100 mg Oral Every 12 hours 06/11/19 0857 06/13/19 0959   06/09/19 1930  doxycycline (VIBRAMYCIN) 100 mg in sodium chloride 0.9 % 250 mL IVPB  Status:  Discontinued     100 mg 125 mL/hr over 120 Minutes Intravenous Every 12 hours 06/09/19 1623 06/11/19 0857       Time Spent in minutes  30   Lala Lund M.D on 06/12/2019 at 9:43 AM  To page go to [email or web address] - password [facility]  [facility] -  Office  [phone]   See all Orders from today for further details    Objective:   Vitals:   06/11/19 1127 06/11/19 1541 06/11/19 2017 06/12/19 0719  BP: 134/62 123/72 (!) 146/74   Pulse: 66 71 80   Resp: 16 18    Temp: 98.7  F (37.1 C) 98.2 F (36.8 C) 98.4 F (36.9 C)   TempSrc: Oral Oral Oral   SpO2: 91% 91% 93% 96%  Weight:      Height:        Wt Readings from Last 3 Encounters:  06/09/19 72.6 kg  05/28/19 72.4 kg  05/27/19 72.6 kg     Intake/Output Summary (Last 24 hours) at 06/12/2019 0943 Last data filed at 06/11/2019 2314 Gross per 24 hour  Intake 240 ml  Output 2725 ml  Net -2485 ml     Physical Exam  Awake Alert, Oriented X 3, No new F.N deficits, Normal affect [place].AT,PERRAL Supple Neck,No JVD, No cervical lymphadenopathy appriciated.  Symmetrical Chest wall movement, Good air movement bilaterally, fine rales RRR,No Gallops, Rubs or new Murmurs, No Parasternal Heave +ve B.Sounds, Abd Soft, No tenderness, No organomegaly appriciated, No rebound - guarding or rigidity. No Cyanosis, Clubbing or edema, No new Rash or bruise    Data Review:    CBC Recent Labs  Lab 06/06/19 0130 06/09/19 1130 06/09/19 2102 06/10/19 0505 06/11/19 0300 06/12/19 0325  WBC 10.9* 14.2* 11.0* 7.5 12.2* 9.4  HGB 15.1 16.6 16.3 15.5 15.2 14.8  HCT 47.0 50.6 48.4 47.9 46.9 46.2  PLT 199 161 138* 148* 153 142*  MCV 88.0 88.2 86.9 87.1 87.5 87.8  MCH 28.3 28.9 29.3 28.2 28.4 28.1  MCHC 32.1 32.8 33.7 32.4 32.4 32.0  RDW 14.0 13.7 13.6 13.6 13.7 14.0  LYMPHSABS 0.4* 0.4*  --  0.2* 0.6* 0.6*  MONOABS 0.6 0.8  --  0.1 0.9 0.9  EOSABS 0.0 0.2  --  0.0 0.0 0.0  BASOSABS 0.0 0.0  --  0.0 0.0 0.0    Chemistries  Recent Labs  Lab 06/06/19 0130 06/09/19 1130 06/09/19 2102 06/10/19 0505 06/11/19 0300 06/12/19 0325  NA 135 134*  --  138 138 137  K 4.9 4.3  --  4.7 4.7 4.7  CL 101 101  --  106 107 101  CO2 25 22  --  25 25 27   GLUCOSE 139* 90  --  127* 156* 93  BUN 31* 22  --  23 33* 33*  CREATININE 0.97 0.87 0.96 0.78 0.86 0.78  CALCIUM 8.0* 7.9*  --  7.8* 8.3* 8.3*  MG  --   --   --  2.3 2.0 2.1  AST 45* 38  --  28 27 29   ALT 85* 57*  --  44 40 42  ALKPHOS 63 71  --  64 65 65  BILITOT 0.7  1.3*  --  1.2 0.3 0.5   ------------------------------------------------------------------------------------------------------------------ No results for input(s): CHOL, HDL, LDLCALC, TRIG, CHOLHDL, LDLDIRECT in the last 72 hours.  No results found for: HGBA1C ------------------------------------------------------------------------------------------------------------------ No results for input(s): TSH, T4TOTAL, T3FREE, THYROIDAB in the last 72 hours.  Invalid input(s): FREET3  Cardiac Enzymes No results for input(s): CKMB, TROPONINI, MYOGLOBIN in the last 168 hours.  Invalid input(s): CK ------------------------------------------------------------------------------------------------------------------    Component Value Date/Time   BNP 26.2 06/12/2019 0320    Micro Results Recent Results (from the past 240 hour(s))  SARS CORONAVIRUS 2 (TAT 6-24 HRS) Nasopharyngeal Nasopharyngeal Swab     Status: Abnormal   Collection Time: 06/09/19  1:20 PM   Specimen: Nasopharyngeal Swab  Result Value Ref Range Status   SARS Coronavirus 2 POSITIVE (A) NEGATIVE Final    Comment: CRITICAL RESULT CALLED TO, READ BACK BY AND VERIFIED WITH: S.GRINDSTAFF RN 1950 06/09/2019 MCCORMICK K (NOTE) SARS-CoV-2 target nucleic acids are DETECTED. The SARS-CoV-2 RNA is generally detectable in upper and lower respiratory specimens during the acute phase of infection. Positive results are indicative of active infection with SARS-CoV-2. Clinical  correlation with patient history and other diagnostic information is necessary to determine patient infection status. Positive results do  not rule out bacterial infection or co-infection with other viruses. The expected result is Negative. Fact Sheet for Patients: SugarRoll.be Fact Sheet for Healthcare Providers: https://www.woods-mathews.com/ This test is not yet approved or cleared by the [place] FDA and  has been  authorized for detection and/or diagnosis of SARS-CoV-2 by FDA under an Emergency Use Authorization (EUA). This EUA will remain  in effect (meaning this test c an be used) for the duration of the COVID-19 declaration under Section 564(b)(1) of the Act, 21 U.S.C. section 360bbb-3(b)(1), unless the authorization is terminated or revoked sooner. Performed at [facility], [place] [address] [place], [place] [address]   Respiratory Panel by PCR     Status: None   Collection Time: 06/10/19  3:18 AM   Specimen: Nasopharyngeal Swab; Respiratory  Result Value Ref Range Status   Adenovirus NOT DETECTED NOT DETECTED Final   Coronavirus 229E NOT DETECTED NOT DETECTED Final    Comment: (NOTE) The Coronavirus on the Respiratory Panel, DOES NOT test for the novel  Coronavirus (2019 nCoV)    Coronavirus HKU1 NOT DETECTED NOT DETECTED Final   Coronavirus NL63 NOT DETECTED NOT DETECTED Final   Coronavirus OC43 NOT DETECTED NOT DETECTED Final   Metapneumovirus NOT DETECTED NOT DETECTED Final   Rhinovirus / Enterovirus NOT DETECTED NOT DETECTED Final   Influenza A NOT DETECTED NOT DETECTED Final   Influenza B NOT DETECTED NOT DETECTED Final   Parainfluenza Virus 1 NOT DETECTED NOT DETECTED Final   Parainfluenza Virus 2 NOT DETECTED NOT DETECTED Final   Parainfluenza Virus 3 NOT DETECTED NOT DETECTED Final   Parainfluenza Virus 4 NOT DETECTED NOT DETECTED Final   Respiratory Syncytial Virus NOT DETECTED NOT DETECTED Final   Bordetella pertussis NOT DETECTED NOT DETECTED Final   Chlamydophila pneumoniae NOT DETECTED NOT DETECTED Final   Mycoplasma pneumoniae NOT DETECTED NOT DETECTED Final    Comment: Performed at [facility], [place]. [address], [place], [place] [address]    Radiology Reports Dg Chest 2 View  Result Date: 05/27/2019 CLINICAL DATA:  Cough, covid EXAM: CHEST - 2 VIEW COMPARISON:  October 04, 2017 FINDINGS: The heart size  and mediastinal contours are within normal limits.  There is mildly increased hazy airspace opacity seen within the periphery of the right upper lung and the left lower lung. Aortic knob calcifications are seen. No acute osseous abnormality. IMPRESSION: Hazy peripheral airspace opacity is within both lungs. The findings in the lungs are nonspecific, but concerning for atypical infection, which includes viral pneumonia. Electronically Signed   By: Prudencio Pair M.D.   On: 05/27/2019 21:23   Ct Angio Chest Pe W Or Wo Contrast  Result Date: 06/09/2019 CLINICAL DATA:  PE suspected, high pretest probability, COVID-19 positive 1 week prior currently on 3 L home O2 EXAM: CT ANGIOGRAPHY CHEST WITH CONTRAST TECHNIQUE: Multidetector CT imaging of the chest was performed using the standard protocol during bolus administration of intravenous contrast. Multiplanar CT image reconstructions and MIPs were obtained to evaluate the vascular anatomy. CONTRAST:  168m OMNIPAQUE IOHEXOL 350 MG/ML SOLN COMPARISON:  Radiograph 06/09/2019 FINDINGS: Cardiovascular: Satisfactory opacification of the pulmonary arteries to the segmental level. No evidence of pulmonary embolism. Normal heart size. No pericardial effusion. Atherosclerotic calcification of the coronary arteries. Atherosclerotic plaque within the normal caliber aorta. Normal 3 vessel branching of the aortic arch. No evident abnormality of proximal great vessels. Mediastinum/Nodes: Prominent low-attenuation mediastinal nodes are likely reactive. No pathologically enlarged mediastinal, hilar axillary adenopathy. Lungs/Pleura: There are multifocal areas airspace consolidation with surrounding patchy areas of ground-glass opacity and intervening air bronchograms. No pneumothorax or effusion. Some atelectatic volume loss is noted in the left lung. Upper Abdomen: Post cholecystectomy. No acute abnormalities present in the visualized portions of the upper abdomen. Musculoskeletal: Multilevel degenerative changes are present in the  imaged portions of the spine. No acute osseous abnormality or suspicious osseous lesion. Mild body wall edema. Mild right gynecomastia. Review of the MIP images confirms the above findings. IMPRESSION: 1. No evidence of pulmonary embolism. 2. Multifocal areas of airspace consolidation with surrounding patchy areas of ground-glass opacity and intervening air bronchograms, compatible with multifocal pneumonia including COVID-19 etiology. 3. Aortic Atherosclerosis (ICD10-I70.0). Electronically Signed   By: PLovena LeM.D.   On: 06/09/2019 18:20   Dg Chest Port 1 View  Result Date: 06/09/2019 CLINICAL DATA:  Cough, COVID-19 positive EXAM: PORTABLE CHEST 1 VIEW COMPARISON:  06/02/2019 FINDINGS: Stable cardiomediastinal contours. Significant interval progression of multifocal bilateral airspace consolidations, with confluent opacities most pronounced in the right upper lobe and left lower lobes. No pleural effusion or pneumothorax. IMPRESSION: Significant interval progression of multifocal pneumonia compared to prior. Electronically Signed   By: NDavina PokeM.D.   On: 06/09/2019 12:43   Dg Chest Port 1v Same Day  Result Date: 06/02/2019 CLINICAL DATA:  Shortness of breath EXAM: PORTABLE CHEST 1 VIEW COMPARISON:  Six days ago FINDINGS: Patchy bilateral pneumonia with mild increase. Normal heart size accounting for elevated right diaphragm and technique. No edema, effusion, or pneumothorax IMPRESSION: COVID-19 with patchy bilateral pneumonia that is more conspicuous than on prior. Electronically Signed   By: JMonte FantasiaM.D.   On: 06/02/2019 08:55

## 2019-06-12 NOTE — Plan of Care (Signed)
Talked w Daughter to update on POC and patient status.  Understands that dad may need to go the SNF for rehab, before eventually returning home.

## 2019-06-12 NOTE — Progress Notes (Signed)
Physical Therapy Treatment Patient Details Name: Alejandro Hicks MRN: [id number] DOB: 12/05/1933 Today's Date: 06/12/2019    History of Present Illness 85 y/o male w/ hx of HTN, asthma, GERD, former smoker, anxiety, OSA, OA, dislipedemia, BPH, anxiety, presented to ED w/ SOB cough and gen weakness was admitted to [facility] 10/03-10/13 w/ COVID PNA. he developed acute hypoxemic resp failure sec to pna and was given remdesivir and steroid and d/c home on 02. 10/16 presented back to ED w/ worsening dry cough, SOB, chest pain, decreased appetite, weakness and lethargy.    PT Comments    Pt continues to struggle with activity tolerance and continues to require significant supplemental O2 with activity. Feel pt can benefit from ST-SNF until his activity tolerance can progress to a level that he can care for himself at home.    Follow Up Recommendations  SNF     Equipment Recommendations  None recommended by PT    Recommendations for Other Services       Precautions / Restrictions Precautions Precautions: Fall Precaution Comments: monitor sats Restrictions Weight Bearing Restrictions: No    Mobility  Bed Mobility               General bed mobility comments: pt up in chair  Transfers Overall transfer level: Needs assistance Equipment used: None Transfers: Sit to/from Stand Sit to Stand: Supervision         General transfer comment: supervision for safety  Ambulation/Gait Ambulation/Gait assistance: Min guard Gait Distance (Feet): 175 Feet Assistive device: None Gait Pattern/deviations: Step-through pattern;Decreased stride length Gait velocity: decr Gait velocity interpretation: 1.31 - 2.62 ft/sec, indicative of limited community ambulator General Gait Details: Assist for safety and to manage and monitor lines. Amb on 6L with SpO2 dropping to 84%. Pt with frequent coughing. 2-3 minutes to recover SpO2 to 90%   Stairs             Wheelchair Mobility     Modified Rankin (Stroke Patients Only)       Balance Overall balance assessment: Needs assistance Sitting-balance support: Feet supported Sitting balance-Leahy Scale: Good     Standing balance support: No upper extremity supported;During functional activity Standing balance-Leahy Scale: Fair                              Cognition Arousal/Alertness: Awake/alert Behavior During Therapy: WFL for tasks assessed/performed;Flat affect Overall Cognitive Status: Within Functional Limits for tasks assessed                                        Exercises      General Comments        Pertinent Vitals/Pain Pain Assessment: No/denies pain    Home Living                      Prior Function            PT Goals (current goals can now be found in the care plan section) Progress towards PT goals: Progressing toward goals    Frequency    Min 3X/week      PT Plan Discharge plan needs to be updated    Co-evaluation              AM-PAC PT "6 Clicks" Mobility   Outcome Measure  Help needed turning from your  back to your side while in a flat bed without using bedrails?: None Help needed moving from lying on your back to sitting on the side of a flat bed without using bedrails?: None Help needed moving to and from a bed to a chair (including a wheelchair)?: A Little Help needed standing up from a chair using your arms (e.g., wheelchair or bedside chair)?: A Little Help needed to walk in hospital room?: A Little Help needed climbing 3-5 steps with a railing? : A Lot 6 Click Score: 19    End of Session Equipment Utilized During Treatment: Oxygen Activity Tolerance: Patient tolerated treatment well Patient left: in chair;with call bell/phone within reach Nurse Communication: Mobility status PT Visit Diagnosis: Other abnormalities of gait and mobility (R26.89)     Time: 7209-4709 PT Time Calculation (min) (ACUTE ONLY): 26  min  Charges:  $Gait Training: 23-37 mins                     [place] Pager [phone] Office [place] 06/12/2019, 2:28 PM

## 2019-06-13 LAB — COMPREHENSIVE METABOLIC PANEL
ALT: 46 U/L — ABNORMAL HIGH (ref 0–44)
AST: 28 U/L (ref 15–41)
Albumin: 2.6 g/dL — ABNORMAL LOW (ref 3.5–5.0)
Alkaline Phosphatase: 65 U/L (ref 38–126)
Anion gap: 12 (ref 5–15)
BUN: 35 mg/dL — ABNORMAL HIGH (ref 8–23)
CO2: 28 mmol/L (ref 22–32)
Calcium: 8.4 mg/dL — ABNORMAL LOW (ref 8.9–10.3)
Chloride: 98 mmol/L (ref 98–111)
Creatinine, Ser: 0.86 mg/dL (ref 0.61–1.24)
GFR calc Af Amer: >60 mL/min (ref >60)
GFR calc non Af Amer: >60 mL/min (ref >60)
Glucose, Bld: 92 mg/dL (ref 70–99)
Potassium: 4.7 mmol/L (ref 3.5–5.1)
Sodium: 138 mmol/L (ref 135–145)
Total Bilirubin: 0.3 mg/dL (ref 0.3–1.2)
Total Protein: 5.8 g/dL — ABNORMAL LOW (ref 6.5–8.1)

## 2019-06-13 LAB — CBC WITH DIFFERENTIAL/PLATELET
Abs Immature Granulocytes: 0.04 10*3/uL (ref 0.00–0.07)
Basophils Absolute: 0 10*3/uL (ref 0.0–0.1)
Basophils Relative: 0 %
Eosinophils Absolute: 0 10*3/uL (ref 0.0–0.5)
Eosinophils Relative: 0 %
HCT: 49.1 % (ref 39.0–52.0)
Hemoglobin: 15.6 g/dL (ref 13.0–17.0)
Immature Granulocytes: 1 %
Lymphocytes Relative: 10 %
Lymphs Abs: 0.8 10*3/uL (ref 0.7–4.0)
MCH: 28.4 pg (ref 26.0–34.0)
MCHC: 31.8 g/dL (ref 30.0–36.0)
MCV: 89.3 fL (ref 80.0–100.0)
Monocytes Absolute: 0.9 10*3/uL (ref 0.1–1.0)
Monocytes Relative: 10 %
Neutro Abs: 6.9 10*3/uL (ref 1.7–7.7)
Neutrophils Relative %: 79 %
Platelets: 143 10*3/uL — ABNORMAL LOW (ref 150–400)
RBC: 5.5 MIL/uL (ref 4.22–5.81)
RDW: 14.1 % (ref 11.5–15.5)
WBC: 8.7 10*3/uL (ref 4.0–10.5)
nRBC: 0 % (ref 0.0–0.2)

## 2019-06-13 LAB — BRAIN NATRIURETIC PEPTIDE: B Natriuretic Peptide: 29.4 pg/mL (ref 0.0–100.0)

## 2019-06-13 LAB — MAGNESIUM: Magnesium: 2.2 mg/dL (ref 1.7–2.4)

## 2019-06-13 LAB — C-REACTIVE PROTEIN: CRP: 0.8 mg/dL (ref <1.0)

## 2019-06-13 MED ORDER — METHYLPREDNISOLONE SODIUM SUCC 40 MG IJ SOLR
20.0000 mg | Freq: Every day | INTRAMUSCULAR | Status: DC
  Administered 2019-06-13 – 2019-06-14 (×2): 20 mg via INTRAVENOUS
  Filled 2019-06-13 (×2): qty 1

## 2019-06-13 NOTE — NC FL2 (Addendum)
[place] LEVEL OF CARE SCREENING TOOL     IDENTIFICATION  Patient Name: Alejandro Hicks Birthdate: 1934/07/14 Sex: male Admission Date (Current Location): 06/09/2019  [facility] and [place] Number:  Herbalist and Address:  [facility] [place] [place] [address] [place], [place] [address]      Provider Number: [id number]  Attending Physician Name and Address:  Thurnell Lose, MD  Relative Name and Phone Number:       Current Level of Care: Hospital Recommended Level of Care: [place] Prior Approval Number:    Date Approved/Denied:   PASRR Number: [id number]   Discharge Plan: SNF    Current Diagnoses: Patient Active Problem List   Diagnosis Date Noted  . Pneumonia due to COVID-19 virus 06/09/2019  . GERD (gastroesophageal reflux disease) 06/09/2019  . Acute hypoxemic respiratory failure due to COVID-19 ([place]) 06/09/2019  . Elevated liver enzymes 06/09/2019  . Pneumonia due to 2019-nCoV 05/28/2019  . Primary osteoarthritis of right hip 12/13/2018  . Plantar fasciitis, right 02/16/2018  . Primary osteoarthritis of right ankle 01/19/2018  . Shoulder impingement syndrome, left 10/01/2017  . Rib pain on right side 12/09/2015  . Right shoulder pain with history of right proximal biceps rupture 12/09/2015  . Essential hypertension, benign 10/21/2015  . Lumbar degenerative disc disease 09/02/2015  . DDD (degenerative disc disease), cervical 08/05/2015    Orientation RESPIRATION BLADDER Height & Weight     Self, Time, Situation, Place  O2(Nasal Cannula 2L) Continent Weight: 160 lb (72.6 kg) Height:  5\' 6"  (167.6 cm)  BEHAVIORAL SYMPTOMS/MOOD NEUROLOGICAL BOWEL NUTRITION STATUS      Continent Diet(2 gram sodium)  AMBULATORY STATUS COMMUNICATION OF NEEDS Skin   Limited Assist Verbally Normal                       Personal Care Assistance Level of Assistance  Dressing, Feeding, Bathing Bathing Assistance:  Limited assistance Feeding assistance: Independent Dressing Assistance: Limited assistance     Functional Limitations Info  Sight, Speech, Hearing Sight Info: Adequate Hearing Info: Adequate Speech Info: Adequate    SPECIAL CARE FACTORS FREQUENCY  PT (By licensed PT), OT (By licensed OT)     PT Frequency: 5x OT Frequency: 5x            Contractures Contractures Info: Not present    Additional Factors Info  Code Status, Allergies Code Status Info: Full Code Allergies Info: Lipitor (Atorvastatin), Zocor (Simvastatin)           Current Medications (06/13/2019):  This is the current hospital active medication list Current Facility-Administered Medications  Medication Dose Route Frequency Provider Last Rate Last Dose  . acetaminophen (TYLENOL) tablet 650 mg  650 mg Oral Q6H PRN Pahwani, Rinka R, MD      . albuterol (VENTOLIN HFA) 108 (90 Base) MCG/ACT inhaler 2 puff  2 puff Inhalation Q6H PRN Thurnell Lose, MD   2 puff at 06/09/19 1458  . ALPRAZolam (XANAX) tablet 0.25 mg  0.25 mg Oral BID PRN Pahwani, Rinka R, MD   0.25 mg at 06/12/19 1024  . amLODipine (NORVASC) tablet 10 mg  10 mg Oral Daily Thurnell Lose, MD   10 mg at 06/13/19 1013  . chlorpheniramine-HYDROcodone (TUSSIONEX) 10-8 MG/5ML suspension 5 mL  5 mL Oral Q12H PRN Thurnell Lose, MD   5 mL at 06/12/19 2348  . cholecalciferol (VITAMIN D3) tablet 5,000 Units  5,000 Units Oral q  morning - 10a Pahwani, Rinka R, MD   5,000 Units at 06/13/19 1012  . enoxaparin (LOVENOX) injection 40 mg  40 mg Subcutaneous Q24H Sampson Si, RPH   40 mg at 06/12/19 2047  . hydrALAZINE (APRESOLINE) injection 10 mg  10 mg Intravenous Q6H PRN Leroy Sea, MD      . methylPREDNISolone sodium succinate (SOLU-MEDROL) 40 mg/mL injection 20 mg  20 mg Intravenous Daily Leroy Sea, MD   20 mg at 06/13/19 1010  . nitroGLYCERIN (NITROSTAT) SL tablet 0.4 mg  0.4 mg Sublingual Q5 min PRN Margarita Grizzle, MD      .  ondansetron (ZOFRAN) injection 4 mg  4 mg Intravenous Q6H PRN Pahwani, Rinka R, MD      . pantoprazole (PROTONIX) EC tablet 40 mg  40 mg Oral Daily Pahwani, Rinka R, MD   40 mg at 06/13/19 1012  . polyethylene glycol (MIRALAX / GLYCOLAX) packet 17 g  17 g Oral Daily Leroy Sea, MD   17 g at 06/13/19 1015  . vitamin C (ASCORBIC ACID) tablet 1,000 mg  1,000 mg Oral q morning - 10a Pahwani, Rinka R, MD   1,000 mg at 06/13/19 1012     Discharge Medications: Please see discharge summary for a list of discharge medications.  Relevant Imaging Results:  Relevant Lab Results:   Additional Information SSN:[id number]  Reuel Boom Elian Gloster, LCSW

## 2019-06-13 NOTE — Progress Notes (Signed)
PROGRESS NOTE                                                                                                                                                                                                             Patient Demographics:    Alejandro Hicks, is a 85 y.o. male, DOB - 1934/06/18, SHF:[id number]  Outpatient Primary MD for the patient is Wenda Low, MD    LOS - 4  Admit date - 06/09/2019    Chief Complaint  Patient presents with  . covid positive  . Shortness of Breath       Brief Narrative  - 85 y.o. male with medical history significant of hypertension, asthma, GERD, former smoker, anxiety recently admitted and treated for COVID-19 pneumonia and discharged home after a prolonged hospital stay comes back soon after discharge with shortness of breath at home.  He was discharged on home oxygen but he never learned how to use it, he now says that he was unable to care for himself at the house and he should have gone to SNF where we were recommending him to go but he refused.  When he came to the ER second time he was on room air and hypoxic, he was placed on 3 L nasal cannula oxygen with resolution of his symptoms.  His blood work did show mild rise in CRP as compared to what he was before other than that unremarkable work-up including CT scan chest which ruled out PE.   Subjective:   Patient in bed, appears comfortable, denies any headache, no fever, no chest pain or pressure, no shortness of breath , no abdominal pain. No focal weakness.   Assessment  & Plan :   1.  Acute Hypoxic Resp. Failure due to Acute Covid 19 Viral Pneumonitis during the ongoing 2020 Covid 19 Pandemic - his pneumonitis and respiratory failure is stable as it was at the time of discharge, patient was unable to take care of himself at home and unable to use provided home oxygen.  He refused SNF placement  last time and he says he made a grave mistake and will use it now.  He was adequately treated last time with prolonged steroid use and Remdisvir.  He was diagnosed with COVID-19 infection on 05/27/2019 initially.  This admission his CT angiogram chest is unremarkable, since his CRP is elevated I have placed him on IV steroids which he will continue for a a few days and subsequently on a oral steroid taper.  His physical exam is stable he is stable on 1.5 L nasal cannula oxygen.  There is an underlying possibility of mild bronchitis.  Placed on doxycycline and switched to PO x 3 days total.  Respiratory viral panel negative.  Monitor sputum Gram stain culture.  Procalcitonin is negative.  However I do not think he can care for himself at home as his son and daughter-in-law who live with him both have COVID-19 infection and are pretty sick.  He clearly failed home discharge.  Will provide him supportive care, PT OT eval and look for SNF discharge.   COVID-19 Labs  Recent Labs    06/11/19 0300 06/12/19 0320 06/13/19 0310  CRP 3.9* 1.5* 0.8    Lab Results  Component Value Date   SARSCOV2NAA POSITIVE (A) 06/09/2019     SpO2: 94 % O2 Flow Rate (L/min): 2 L/min FiO2 (%): (!) 4 %      Component Value Date/Time   BNP 29.4 06/13/2019 0310     2.  GERD.  PPI.  3.  Dehydration with deconditioning.  Has been treated with IV fluids.  4.  Hypertension.  Blood pressure is improved low-dose Norvasc will be added.  5.  Few rales - trial of IV lasix. No CHF.  6. Mild transaminitis.  COVID-19 related and recent use of Remdisvir.  Trend is stable and resolving.  Symptom-free.     Hepatic Function Latest Ref Rng & Units 06/13/2019 06/12/2019 06/11/2019  Total Protein 6.5 - 8.1 g/dL 5.8(L) 5.4(L) 5.3(L)  Albumin 3.5 - 5.0 g/dL 2.6(L) 2.6(L) 2.3(L)  AST 15 - 41 U/L _0 ALT 0 - 44 U/L 46(H) 42 40  Alk Phosphatase 38 - 126 U/L 65 65 65  Total Bilirubin 0.3 - 1.2 mg/dL 0.3 0.5 0.3      Condition - Fair  Family Communication  :  Daughter on 06/13/19  Code Status :  Full  Diet :   Diet Order            Diet 2 gram sodium Room service appropriate? Yes; Fluid consistency: Thin  Diet effective now               Disposition Plan  :  SNF  Consults  :  None  Procedures  :   CTA - Non acute  PUD Prophylaxis :  PPI  DVT Prophylaxis  :  Lovenox added  Lab Results  Component Value Date   PLT 143 (L) 06/13/2019    Inpatient Medications  Scheduled Meds: . amLODipine  10 mg Oral Daily  . cholecalciferol  5,000 Units Oral q morning - 10a  . enoxaparin (LOVENOX) injection  40 mg Subcutaneous Q24H  . methylPREDNISolone (SOLU-MEDROL) injection  20 mg Intravenous Daily  . pantoprazole  40 mg Oral Daily  . polyethylene glycol  17 g Oral Daily  . vitamin C  1,000 mg Oral q morning - 10a   Continuous Infusions:  PRN Meds:.acetaminophen, albuterol, ALPRAZolam, chlorpheniramine-HYDROcodone, hydrALAZINE, nitroGLYCERIN, [DISCONTINUED] ondansetron **OR** ondansetron (ZOFRAN) IV  Antibiotics  :    Anti-infectives (From admission, onward)   Start     Dose/Rate Route Frequency  Ordered Stop   06/11/19 1000  doxycycline (VIBRA-TABS) tablet 100 mg     100 mg Oral Every 12 hours 06/11/19 0857 06/12/19 2047   06/09/19 1930  doxycycline (VIBRAMYCIN) 100 mg in sodium chloride 0.9 % 250 mL IVPB  Status:  Discontinued     100 mg 125 mL/hr over 120 Minutes Intravenous Every 12 hours 06/09/19 1623 06/11/19 0857       Time Spent in minutes  30   Lala Lund M.D on 06/13/2019 at 9:45 AM  To page go to [email or web address] - password [facility]  [facility] -  Office  [phone]   See all Orders from today for further details    Objective:   Vitals:   06/12/19 0719 06/12/19 2033 06/13/19 0420 06/13/19 0729  BP:  101/73  (!) 154/67  Pulse:  75  60  Resp:  _0 Temp:  97.9 F (36.6 C) 98.6 F (37 C) 98.1 F (36.7 C)  TempSrc:  Oral Oral Oral  SpO2:  96% 92% 93% 94%  Weight:      Height:        Wt Readings from Last 3 Encounters:  06/09/19 72.6 kg  05/28/19 72.4 kg  05/27/19 72.6 kg     Intake/Output Summary (Last 24 hours) at 06/13/2019 0945 Last data filed at 06/12/2019 1226 Gross per 24 hour  Intake -  Output 400 ml  Net -400 ml     Physical Exam  Awake Alert, Oriented X 3, No new F.N deficits, Normal affect Portal.AT,PERRAL Supple Neck,No JVD, No cervical lymphadenopathy appriciated.  Symmetrical Chest wall movement, Good air movement bilaterally, CTAB RRR,No Gallops, Rubs or new Murmurs, No Parasternal Heave +ve B.Sounds, Abd Soft, No tenderness, No organomegaly appriciated, No rebound - guarding or rigidity. No Cyanosis, Clubbing or edema, No new Rash or bruise    Data Review:    CBC Recent Labs  Lab 06/09/19 1130 06/09/19 2102 06/10/19 0505 06/11/19 0300 06/12/19 0325 06/13/19 0310  WBC 14.2* 11.0* 7.5 12.2* 9.4 8.7  HGB 16.6 16.3 15.5 15.2 14.8 15.6  HCT 50.6 48.4 47.9 46.9 46.2 49.1  PLT 161 138* 148* 153 142* 143*  MCV 88.2 86.9 87.1 87.5 87.8 89.3  MCH 28.9 29.3 28.2 28.4 28.1 28.4  MCHC 32.8 33.7 32.4 32.4 32.0 31.8  RDW 13.7 13.6 13.6 13.7 14.0 14.1  LYMPHSABS 0.4*  --  0.2* 0.6* 0.6* 0.8  MONOABS 0.8  --  0.1 0.9 0.9 0.9  EOSABS 0.2  --  0.0 0.0 0.0 0.0  BASOSABS 0.0  --  0.0 0.0 0.0 0.0    Chemistries  Recent Labs  Lab 06/09/19 1130 06/09/19 2102 06/10/19 0505 06/11/19 0300 06/12/19 0325 06/13/19 0310  NA 134*  --  138 138 137 138  K 4.3  --  4.7 4.7 4.7 4.7  CL 101  --  106 107 101 98  CO2 22  --  _1 GLUCOSE 90  --  127* 156* 93 92  BUN 22  --  23 33* 33* 35*  CREATININE 0.87 0.96 0.78 0.86 0.78 0.86  CALCIUM 7.9*  --  7.8* 8.3* 8.3* 8.4*  MG  --   --  2.3 2.0 2.1 2.2  AST 38  --  _2 ALT 57*  --  44 40 42 46*  ALKPHOS 71  --  64 65 65 65  BILITOT 1.3*  --  1.2 0.3 0.5 0.3    ------------------------------------------------------------------------------------------------------------------ No  results for input(s): CHOL, HDL, LDLCALC, TRIG, CHOLHDL, LDLDIRECT in the last 72 hours.  No results found for: HGBA1C ------------------------------------------------------------------------------------------------------------------ No results for input(s): TSH, T4TOTAL, T3FREE, THYROIDAB in the last 72 hours.  Invalid input(s): FREET3  Cardiac Enzymes No results for input(s): CKMB, TROPONINI, MYOGLOBIN in the last 168 hours.  Invalid input(s): CK ------------------------------------------------------------------------------------------------------------------    Component Value Date/Time   BNP 29.4 06/13/2019 0310    Micro Results Recent Results (from the past 240 hour(s))  SARS CORONAVIRUS 2 (TAT 6-24 HRS) Nasopharyngeal Nasopharyngeal Swab     Status: Abnormal   Collection Time: 06/09/19  1:20 PM   Specimen: Nasopharyngeal Swab  Result Value Ref Range Status   SARS Coronavirus 2 POSITIVE (A) NEGATIVE Final    Comment: CRITICAL RESULT CALLED TO, READ BACK BY AND VERIFIED WITH: S.GRINDSTAFF RN 1950 06/09/2019 MCCORMICK K (NOTE) SARS-CoV-2 target nucleic acids are DETECTED. The SARS-CoV-2 RNA is generally detectable in upper and lower respiratory specimens during the acute phase of infection. Positive results are indicative of active infection with SARS-CoV-2. Clinical  correlation with patient history and other diagnostic information is necessary to determine patient infection status. Positive results do  not rule out bacterial infection or co-infection with other viruses. The expected result is Negative. Fact Sheet for Patients: SugarRoll.be Fact Sheet for Healthcare Providers: https://www.woods-mathews.com/ This test is not yet approved or cleared by the [place] FDA and  has been authorized for detection  and/or diagnosis of SARS-CoV-2 by FDA under an Emergency Use Authorization (EUA). This EUA will remain  in effect (meaning this test c an be used) for the duration of the COVID-19 declaration under Section 564(b)(1) of the Act, 21 U.S.C. section 360bbb-3(b)(1), unless the authorization is terminated or revoked sooner. Performed at [facility], [place] [address] [place], [place] [address]   Respiratory Panel by PCR     Status: None   Collection Time: 06/10/19  3:18 AM   Specimen: Nasopharyngeal Swab; Respiratory  Result Value Ref Range Status   Adenovirus NOT DETECTED NOT DETECTED Final   Coronavirus 229E NOT DETECTED NOT DETECTED Final    Comment: (NOTE) The Coronavirus on the Respiratory Panel, DOES NOT test for the novel  Coronavirus (2019 nCoV)    Coronavirus HKU1 NOT DETECTED NOT DETECTED Final   Coronavirus NL63 NOT DETECTED NOT DETECTED Final   Coronavirus OC43 NOT DETECTED NOT DETECTED Final   Metapneumovirus NOT DETECTED NOT DETECTED Final   Rhinovirus / Enterovirus NOT DETECTED NOT DETECTED Final   Influenza A NOT DETECTED NOT DETECTED Final   Influenza B NOT DETECTED NOT DETECTED Final   Parainfluenza Virus 1 NOT DETECTED NOT DETECTED Final   Parainfluenza Virus 2 NOT DETECTED NOT DETECTED Final   Parainfluenza Virus 3 NOT DETECTED NOT DETECTED Final   Parainfluenza Virus 4 NOT DETECTED NOT DETECTED Final   Respiratory Syncytial Virus NOT DETECTED NOT DETECTED Final   Bordetella pertussis NOT DETECTED NOT DETECTED Final   Chlamydophila pneumoniae NOT DETECTED NOT DETECTED Final   Mycoplasma pneumoniae NOT DETECTED NOT DETECTED Final    Comment: Performed at [facility], [place]. [address], [place], [place] [address]    Radiology Reports Dg Chest 2 View  Result Date: 05/27/2019 CLINICAL DATA:  Cough, covid EXAM: CHEST - 2 VIEW COMPARISON:  October 04, 2017 FINDINGS: The heart size and mediastinal contours are within normal limits. There is mildly increased  hazy airspace opacity seen within the periphery of the right upper lung and the left lower  lung. Aortic knob calcifications are seen. No acute osseous abnormality. IMPRESSION: Hazy peripheral airspace opacity is within both lungs. The findings in the lungs are nonspecific, but concerning for atypical infection, which includes viral pneumonia. Electronically Signed   By: Prudencio Pair M.D.   On: 05/27/2019 21:23   Ct Angio Chest Pe W Or Wo Contrast  Result Date: 06/09/2019 CLINICAL DATA:  PE suspected, high pretest probability, COVID-19 positive 1 week prior currently on 3 L home O2 EXAM: CT ANGIOGRAPHY CHEST WITH CONTRAST TECHNIQUE: Multidetector CT imaging of the chest was performed using the standard protocol during bolus administration of intravenous contrast. Multiplanar CT image reconstructions and MIPs were obtained to evaluate the vascular anatomy. CONTRAST:  160m OMNIPAQUE IOHEXOL 350 MG/ML SOLN COMPARISON:  Radiograph 06/09/2019 FINDINGS: Cardiovascular: Satisfactory opacification of the pulmonary arteries to the segmental level. No evidence of pulmonary embolism. Normal heart size. No pericardial effusion. Atherosclerotic calcification of the coronary arteries. Atherosclerotic plaque within the normal caliber aorta. Normal 3 vessel branching of the aortic arch. No evident abnormality of proximal great vessels. Mediastinum/Nodes: Prominent low-attenuation mediastinal nodes are likely reactive. No pathologically enlarged mediastinal, hilar axillary adenopathy. Lungs/Pleura: There are multifocal areas airspace consolidation with surrounding patchy areas of ground-glass opacity and intervening air bronchograms. No pneumothorax or effusion. Some atelectatic volume loss is noted in the left lung. Upper Abdomen: Post cholecystectomy. No acute abnormalities present in the visualized portions of the upper abdomen. Musculoskeletal: Multilevel degenerative changes are present in the imaged portions of the spine.  No acute osseous abnormality or suspicious osseous lesion. Mild body wall edema. Mild right gynecomastia. Review of the MIP images confirms the above findings. IMPRESSION: 1. No evidence of pulmonary embolism. 2. Multifocal areas of airspace consolidation with surrounding patchy areas of ground-glass opacity and intervening air bronchograms, compatible with multifocal pneumonia including COVID-19 etiology. 3. Aortic Atherosclerosis (ICD10-I70.0). Electronically Signed   By: PLovena LeM.D.   On: 06/09/2019 18:20   Dg Chest Port 1 View  Result Date: 06/09/2019 CLINICAL DATA:  Cough, COVID-19 positive EXAM: PORTABLE CHEST 1 VIEW COMPARISON:  06/02/2019 FINDINGS: Stable cardiomediastinal contours. Significant interval progression of multifocal bilateral airspace consolidations, with confluent opacities most pronounced in the right upper lobe and left lower lobes. No pleural effusion or pneumothorax. IMPRESSION: Significant interval progression of multifocal pneumonia compared to prior. Electronically Signed   By: NDavina PokeM.D.   On: 06/09/2019 12:43   Dg Chest Port 1v Same Day  Result Date: 06/02/2019 CLINICAL DATA:  Shortness of breath EXAM: PORTABLE CHEST 1 VIEW COMPARISON:  Six days ago FINDINGS: Patchy bilateral pneumonia with mild increase. Normal heart size accounting for elevated right diaphragm and technique. No edema, effusion, or pneumothorax IMPRESSION: COVID-19 with patchy bilateral pneumonia that is more conspicuous than on prior. Electronically Signed   By: JMonte FantasiaM.D.   On: 06/02/2019 08:55

## 2019-06-13 NOTE — Plan of Care (Addendum)
Called daughter to give update, but got voicemail.  Left message with call back number - awaiting on return call.  Daughter returned call and was able to update - also told we were currently aiming for [facility] (per her Dad's choice).   Did speak with patient about SNF options, per SW request. Both daughter and patient are agreeable to SNF for rehab and also understand it may need to be a covid positive Rehab to begin with.  [facility] or [facility] [place]).    He would much prefer [facility], since he lives in [place].  * Really does not not want to be in [place]. SW text to inform of patient choice and will see if possible. Patient informed.

## 2019-06-13 NOTE — Progress Notes (Signed)
Spoke to pt's daughter [name] and gave update about how her dad is doing. Questions were answered, and she is appreciative of the care her dad is getting. Will continue to monitor the pt

## 2019-06-14 DIAGNOSIS — R748 Abnormal levels of other serum enzymes: Secondary | ICD-10-CM

## 2019-06-14 DIAGNOSIS — J181 Lobar pneumonia, unspecified organism: Secondary | ICD-10-CM

## 2019-06-14 DIAGNOSIS — I10 Essential (primary) hypertension: Secondary | ICD-10-CM

## 2019-06-14 DIAGNOSIS — J1289 Other viral pneumonia: Secondary | ICD-10-CM

## 2019-06-14 DIAGNOSIS — K219 Gastro-esophageal reflux disease without esophagitis: Secondary | ICD-10-CM

## 2019-06-14 LAB — COMPREHENSIVE METABOLIC PANEL
ALT: 59 U/L — ABNORMAL HIGH (ref 0–44)
AST: 32 U/L (ref 15–41)
Albumin: 2.8 g/dL — ABNORMAL LOW (ref 3.5–5.0)
Alkaline Phosphatase: 67 U/L (ref 38–126)
Anion gap: 9 (ref 5–15)
BUN: 31 mg/dL — ABNORMAL HIGH (ref 8–23)
CO2: 27 mmol/L (ref 22–32)
Calcium: 8.4 mg/dL — ABNORMAL LOW (ref 8.9–10.3)
Chloride: 99 mmol/L (ref 98–111)
Creatinine, Ser: 0.77 mg/dL (ref 0.61–1.24)
GFR calc Af Amer: >60 mL/min (ref >60)
GFR calc non Af Amer: >60 mL/min (ref >60)
Glucose, Bld: 121 mg/dL — ABNORMAL HIGH (ref 70–99)
Potassium: 4.3 mmol/L (ref 3.5–5.1)
Sodium: 135 mmol/L (ref 135–145)
Total Bilirubin: 0.6 mg/dL (ref 0.3–1.2)
Total Protein: 5.9 g/dL — ABNORMAL LOW (ref 6.5–8.1)

## 2019-06-14 LAB — CBC WITH DIFFERENTIAL/PLATELET
Abs Immature Granulocytes: 0.04 10*3/uL (ref 0.00–0.07)
Basophils Absolute: 0 10*3/uL (ref 0.0–0.1)
Basophils Relative: 0 %
Eosinophils Absolute: 0 10*3/uL (ref 0.0–0.5)
Eosinophils Relative: 0 %
HCT: 47.9 % (ref 39.0–52.0)
Hemoglobin: 15.3 g/dL (ref 13.0–17.0)
Immature Granulocytes: 0 %
Lymphocytes Relative: 9 %
Lymphs Abs: 0.9 10*3/uL (ref 0.7–4.0)
MCH: 28.5 pg (ref 26.0–34.0)
MCHC: 31.9 g/dL (ref 30.0–36.0)
MCV: 89.2 fL (ref 80.0–100.0)
Monocytes Absolute: 0.7 10*3/uL (ref 0.1–1.0)
Monocytes Relative: 7 %
Neutro Abs: 8.6 10*3/uL — ABNORMAL HIGH (ref 1.7–7.7)
Neutrophils Relative %: 84 %
Platelets: 142 10*3/uL — ABNORMAL LOW (ref 150–400)
RBC: 5.37 MIL/uL (ref 4.22–5.81)
RDW: 13.8 % (ref 11.5–15.5)
WBC: 10.3 10*3/uL (ref 4.0–10.5)
nRBC: 0 % (ref 0.0–0.2)

## 2019-06-14 LAB — MAGNESIUM: Magnesium: 2.3 mg/dL (ref 1.7–2.4)

## 2019-06-14 LAB — BRAIN NATRIURETIC PEPTIDE: B Natriuretic Peptide: 22.6 pg/mL (ref 0.0–100.0)

## 2019-06-14 LAB — C-REACTIVE PROTEIN: CRP: <0.8 mg/dL (ref <1.0)

## 2019-06-14 MED ORDER — SODIUM CHLORIDE 0.9 % IV SOLN
1.0000 g | INTRAVENOUS | Status: DC
  Administered 2019-06-14 – 2019-06-21 (×8): 1 g via INTRAVENOUS
  Filled 2019-06-14 (×8): qty 10

## 2019-06-14 MED ORDER — IPRATROPIUM-ALBUTEROL 20-100 MCG/ACT IN AERS
1.0000 | INHALATION_SPRAY | Freq: Four times a day (QID) | RESPIRATORY_TRACT | Status: DC
  Administered 2019-06-14 – 2019-06-22 (×31): 1 via RESPIRATORY_TRACT
  Filled 2019-06-14: qty 4

## 2019-06-14 MED ORDER — ALBUTEROL SULFATE HFA 108 (90 BASE) MCG/ACT IN AERS
2.0000 | INHALATION_SPRAY | RESPIRATORY_TRACT | Status: DC | PRN
  Administered 2019-06-15 – 2019-06-22 (×2): 2 via RESPIRATORY_TRACT
  Filled 2019-06-14: qty 6.7

## 2019-06-14 NOTE — Progress Notes (Signed)
Spoke with patient's daughter, [name].  Answered all her questions and addressed all concerns.  See chart for nsg assessment.

## 2019-06-14 NOTE — Progress Notes (Signed)
Physical Therapy Treatment Patient Details Name: Alejandro Hicks MRN: [id number] DOB: [birth year] Today's Date: 06/14/2019    History of Present Illness 85 y/o male w/ hx of HTN, asthma, GERD, former smoker, anxiety, OSA, OA, dislipedemia, BPH, anxiety, presented to ED w/ SOB cough and gen weakness was admitted to [facility] 10/03-10/13 w/ COVID PNA. he developed acute hypoxemic resp failure sec to pna and was given remdesivir and steroid and d/c home on 02. 10/16 presented back to ED w/ worsening dry cough, SOB, chest pain, decreased appetite, weakness and lethargy.    PT Comments    Fair tolerance to tx this pm. Pt was able to ambulate approx 256ft with no AD and SBA on 6L/min via HFNC and maintain sats in high 80s and 90s, but looks extremely fatigued, as voices such. Noted pt ambulates at brisk cadence needing cues to slow down and take some deep breaths. He c/o pain in chest with deep breathing.    Follow Up Recommendations  SNF     Equipment Recommendations  None recommended by PT    Recommendations for Other Services       Precautions / Restrictions Precautions Precautions: Fall Precaution Comments: monitor sats Restrictions Weight Bearing Restrictions: No    Mobility  Bed Mobility               General bed mobility comments: pt found in recliner upon therapist arrival  Transfers Overall transfer level: Needs assistance Equipment used: None Transfers: Sit to/from Stand Sit to Stand: Supervision         General transfer comment: supervision for safety  Ambulation/Gait Ambulation/Gait assistance: Supervision Gait Distance (Feet): 200 Feet Assistive device: None Gait Pattern/deviations: Step-through pattern     General Gait Details: ambulates quite briskly needing cues to slow down, pt on 6L/min via HFNC, sats remained in high 80s and 90s throughout, but did need continued cues for breathing and also to slwo down   Stairs             Wheelchair  Mobility    Modified Rankin (Stroke Patients Only)       Balance Overall balance assessment: Needs assistance Sitting-balance support: Feet supported Sitting balance-Leahy Scale: Good     Standing balance support: No upper extremity supported;During functional activity Standing balance-Leahy Scale: Good                              Cognition Arousal/Alertness: Awake/alert Behavior During Therapy: WFL for tasks assessed/performed;Flat affect Overall Cognitive Status: Within Functional Limits for tasks assessed                                 General Comments: needs cues for safety tends to want to perfom tasks at same speed as he usually does but activity tolerance is decreased      Exercises      General Comments General comments (skin integrity, edema, etc.): Pt still coughing with mobility, instructions state to increase 02 for mobility, pt was found on 6L/min via HFNC hence did not increase past this, pt did well was able to ambulate approx 280ft with no AD and SBA and maintain sats in high 80s and low 90s. he does c/o pain in chest w/ coughing.      Pertinent Vitals/Pain Pain Assessment: Faces Faces Pain Scale: Hurts little more Pain Location: in chest when coughing Pain Descriptors / Indicators:  Aching;Discomfort Pain Intervention(s): Limited activity within patient's tolerance    Home Living                      Prior Function            PT Goals (current goals can now be found in the care plan section) Acute Rehab PT Goals Time For Goal Achievement: 06/24/19 Potential to Achieve Goals: Good Progress towards PT goals: Progressing toward goals    Frequency    Min 2X/week      PT Plan Current plan remains appropriate    Co-evaluation              AM-PAC PT "6 Clicks" Mobility   Outcome Measure  Help needed turning from your back to your side while in a flat bed without using bedrails?: None Help needed  moving from lying on your back to sitting on the side of a flat bed without using bedrails?: None Help needed moving to and from a bed to a chair (including a wheelchair)?: A Little Help needed standing up from a chair using your arms (e.g., wheelchair or bedside chair)?: A Little Help needed to walk in hospital room?: A Little Help needed climbing 3-5 steps with a railing? : A Lot 6 Click Score: 19    End of Session   Activity Tolerance: Patient tolerated treatment well Patient left: in chair;with call bell/phone within reach   PT Visit Diagnosis: Other abnormalities of gait and mobility (R26.89)     Time: 5427-0623 PT Time Calculation (min) (ACUTE ONLY): 16 min  Charges:  $Gait Training: 8-22 mins                     Drema Pry, PT    Freddi Starr 06/14/2019, 4:10 PM

## 2019-06-14 NOTE — Progress Notes (Signed)
PROGRESS NOTE    Alejandro Hicks  [id number]:[id number] DOB: March 25, 1934 DOA: 06/09/2019 PCP: Wenda Low, MD    Brief Narrative:  85 year old male who presented with dyspnea and generalized weakness.  She does have significant past medical history for hypertension, asthma, GERD, anxiety and tobacco abuse.  Recently treated for SARS Covid 19 pneumonia October 3 to cover 13.  He was treated with remdesivir and systemic corticosteroids.  At home he had worsening dry cough and dyspnea, he could not manage his home oxygen.  On his initial physical examination blood pressure 161/67, pulse rate 73, respiratory 29, oxygen saturation 90%.  His lungs are clear to auscultation bilaterally, heart S1-S2 present rhythm, abdomen soft, bowel extremity edema.  Chest radiograph with bilateral alveolar infiltrates, both bases and right upper lobe.  CT chest negative for pulmonary embolism, bilateral patchy infiltrates, predominantly right upper lobe with air bronchogram.  Patient was admitted to the hospital with acute on chronic hypoxic respiratory failure, in the setting of SARS COVID-19 viral pneumonia.  Patient continue to have increased oxygen requirements and persistent cough. In the setting of dense infiltrate on the right upper lobe will start patient on IV ceftriaxone, possible bacterial superinfection.    Assessment & Plan:   Principal Problem:   Acute hypoxemic respiratory failure due to COVID-19 [facility]) Active Problems:   Essential hypertension, benign   Pneumonia due to COVID-19 virus   GERD (gastroesophageal reflux disease)   Elevated liver enzymes   1. Acute hypoxic respiratory failure due to right upper lobe pneumonia, suspected bacterial superinfection, in the setting of recent viral pneumonia, SARS COVID 19. Patient this am with worsening dyspnea and increased oxygen requirements, up to 6 LPM per high flow nasal canula to keep oxygen saturation greater than 88%. Wbc is 10 from 8,7, his  procalcitonin has been low, but considering worsening oxygenation and dense right upper lobe infiltrate, will place patient on IV Ceftriaxone, dc systemic steroids. Continue airway clearing techniques with flutter valve and chest PT, continue bronchodilator therapy.   2. HTN. Continue with blood pressure control with amlodipine. At home on valsartan and HCTZ.   3. GERD. Continue with pantoprazole.  4. Anxiety. Continue with alprazolam.    5. Asthma. No signs of acute exacerbation.   DVT prophylaxis: enoxaparin   Code Status: full Family Communication: no family at the bedside  Disposition Plan/ discharge barriers: pending clinical improvement, will need SNF.   Body mass index is 25.82 kg/m. Malnutrition Type:      Malnutrition Characteristics:      Nutrition Interventions:     RN Pressure Injury Documentation:     Consultants:     Procedures:     Antimicrobials:   Ceftriaxone IV    Subjective: Patient continue to have dyspnea, worse with movement, no nausea or vomiting, positive productive cough. Limited mobility.   Objective: Vitals:   06/13/19 2159 06/13/19 2300 06/14/19 0505 06/14/19 0746  BP: 127/69  137/78 134/68  Pulse: 81 70 63 69  Resp:    19  Temp: 98.6 F (37 C)  97.6 F (36.4 C) 98 F (36.7 C)  TempSrc: Oral  Oral Oral  SpO2: (!) 89% 96% (!) 89% 92%  Weight:      Height:        Intake/Output Summary (Last 24 hours) at 06/14/2019 0929 Last data filed at 06/14/2019 0500 Gross per 24 hour  Intake 1200 ml  Output 1075 ml  Net 125 ml   Filed Weights   06/09/19 1209  Weight: 72.6 kg    Examination:   General: Not in pain or dyspnea, deconditioned  Neurology: Awake and alert, non focal  E ENT: positive pallor, no icterus, oral mucosa moist Cardiovascular: No JVD. S1-S2 present, rhythmic, no gallops, rubs, or murmurs. No lower extremity edema. Pulmonary: positive breath sounds bilaterally.  Gastrointestinal. Abdomen with no  organomegaly, non tender, no rebound or guarding Skin. No rashes Musculoskeletal: no joint deformities     Data Reviewed: I have personally reviewed following labs and imaging studies  CBC: Recent Labs  Lab 06/10/19 0505 06/11/19 0300 06/12/19 0325 06/13/19 0310 06/14/19 0213  WBC 7.5 12.2* 9.4 8.7 10.3  NEUTROABS 7.1 10.6* 7.8* 6.9 8.6*  HGB 15.5 15.2 14.8 15.6 15.3  HCT 47.9 46.9 46.2 49.1 47.9  MCV 87.1 87.5 87.8 89.3 89.2  PLT 148* 153 142* 143* 142*   Basic Metabolic Panel: Recent Labs  Lab 06/10/19 0505 06/11/19 0300 06/12/19 0325 06/13/19 0310 06/14/19 0213  NA 138 138 137 138 135  K 4.7 4.7 4.7 4.7 4.3  CL 106 107 101 98 99  CO2 25 25 27 28 27   GLUCOSE 127* 156* 93 92 121*  BUN 23 33* 33* 35* 31*  CREATININE 0.78 0.86 0.78 0.86 0.77  CALCIUM 7.8* 8.3* 8.3* 8.4* 8.4*  MG 2.3 2.0 2.1 2.2 2.3  PHOS 3.2  --   --   --   --    GFR: Estimated Creatinine Clearance: 64.2 mL/min (by C-G formula based on SCr of 0.77 mg/dL). Liver Function Tests: Recent Labs  Lab 06/10/19 0505 06/11/19 0300 06/12/19 0325 06/13/19 0310 06/14/19 0213  AST 28 27 29 28  32  ALT 44 40 42 46* 59*  ALKPHOS 64 65 65 65 67  BILITOT 1.2 0.3 0.5 0.3 0.6  PROT 5.6* 5.3* 5.4* 5.8* 5.9*  ALBUMIN 2.5* 2.3* 2.6* 2.6* 2.8*   No results for input(s): LIPASE, AMYLASE in the last 168 hours. No results for input(s): AMMONIA in the last 168 hours. Coagulation Profile: No results for input(s): INR, PROTIME in the last 168 hours. Cardiac Enzymes: No results for input(s): CKTOTAL, CKMB, CKMBINDEX, TROPONINI in the last 168 hours. BNP (last 3 results) No results for input(s): PROBNP in the last 8760 hours. HbA1C: No results for input(s): HGBA1C in the last 72 hours. CBG: No results for input(s): GLUCAP in the last 168 hours. Lipid Profile: No results for input(s): CHOL, HDL, LDLCALC, TRIG, CHOLHDL, LDLDIRECT in the last 72 hours. Thyroid Function Tests: No results for input(s): TSH,  T4TOTAL, FREET4, T3FREE, THYROIDAB in the last 72 hours. Anemia Panel: No results for input(s): VITAMINB12, FOLATE, FERRITIN, TIBC, IRON, RETICCTPCT in the last 72 hours.    Radiology Studies: I have reviewed all of the imaging during this hospital visit personally     Scheduled Meds: . amLODipine  10 mg Oral Daily  . cholecalciferol  5,000 Units Oral q morning - 10a  . enoxaparin (LOVENOX) injection  40 mg Subcutaneous Q24H  . methylPREDNISolone (SOLU-MEDROL) injection  20 mg Intravenous Daily  . pantoprazole  40 mg Oral Daily  . polyethylene glycol  17 g Oral Daily  . vitamin C  1,000 mg Oral q morning - 10a   Continuous Infusions:   LOS: 5 days        Kenetra Hildenbrand 06/16/19, MD

## 2019-06-15 LAB — CBC WITH DIFFERENTIAL/PLATELET
Abs Immature Granulocytes: 0.07 10*3/uL (ref 0.00–0.07)
Basophils Absolute: 0 10*3/uL (ref 0.0–0.1)
Basophils Relative: 0 %
Eosinophils Absolute: 0.1 10*3/uL (ref 0.0–0.5)
Eosinophils Relative: 1 %
HCT: 47.5 % (ref 39.0–52.0)
Hemoglobin: 15.1 g/dL (ref 13.0–17.0)
Immature Granulocytes: 1 %
Lymphocytes Relative: 12 %
Lymphs Abs: 1.2 10*3/uL (ref 0.7–4.0)
MCH: 28.5 pg (ref 26.0–34.0)
MCHC: 31.8 g/dL (ref 30.0–36.0)
MCV: 89.8 fL (ref 80.0–100.0)
Monocytes Absolute: 0.7 10*3/uL (ref 0.1–1.0)
Monocytes Relative: 7 %
Neutro Abs: 8.6 10*3/uL — ABNORMAL HIGH (ref 1.7–7.7)
Neutrophils Relative %: 79 %
Platelets: 128 10*3/uL — ABNORMAL LOW (ref 150–400)
RBC: 5.29 MIL/uL (ref 4.22–5.81)
RDW: 14 % (ref 11.5–15.5)
WBC: 10.7 10*3/uL — ABNORMAL HIGH (ref 4.0–10.5)
nRBC: 0 % (ref 0.0–0.2)

## 2019-06-15 NOTE — Progress Notes (Signed)
Spoke with patient's daughter, [name], and updated her on patient's status.  All questions and concerns addressed.  See chart for nsg assessment.

## 2019-06-15 NOTE — Plan of Care (Signed)
  Problem: Respiratory: Goal: Will maintain a patent airway Outcome: Progressing Goal: Complications related to the disease process, condition or treatment will be avoided or minimized Outcome: Progressing   

## 2019-06-15 NOTE — Progress Notes (Addendum)
PROGRESS NOTE    Alejandro Hicks  QHU:[id number] DOB: 10/24/1933 DOA: 06/09/2019 PCP: Georgann Housekeeper, MD    Brief Narrative:  85 year old male who presented with dyspnea and generalized weakness.  She does have significant past medical history for hypertension, asthma, GERD, anxiety and tobacco abuse.  Recently treated for SARS Covid 19 pneumonia October 3 to cover 13.  He was treated with remdesivir and systemic corticosteroids.  At home he had worsening dry cough and dyspnea, he could not manage his home oxygen.  On his initial physical examination blood pressure 161/67, pulse rate 73, respiratory 29, oxygen saturation 90%.  His lungs are clear to auscultation bilaterally, heart S1-S2 present rhythm, abdomen soft, bowel extremity edema.  Chest radiograph with bilateral alveolar infiltrates, both bases and right upper lobe.  CT chest negative for pulmonary embolism, bilateral patchy infiltrates, predominantly right upper lobe with air bronchogram.  Patient was admitted to the hospital with acute on chronic hypoxic respiratory failure, in the setting of recent SARS COVID-19 viral pneumonia.  Patient continue to have increased oxygen requirements and persistent cough. In the setting of dense infiltrate on the right upper lobe (positive air bronchogram) will start patient on IV ceftriaxone, possible bacterial superinfection.    Assessment & Plan:   Principal Problem:   Acute hypoxemic respiratory failure due to COVID-19 [facility]) Active Problems:   Essential hypertension, benign   Pneumonia due to COVID-19 virus   GERD (gastroesophageal reflux disease)   Elevated liver enzymes   1. Acute hypoxic respiratory failure due to right upper lobe pneumonia, suspected bacterial superinfection, in the setting of recent viral pneumonia, SARS COVID 19.  This am down to 3 LPM per nasal cannula with oxygen saturation 98%. Stable Wbc is 10.7. Will continue antibiotic therapy with IV ceftriaxone #2,  guaifenesin and airway cleaning techniques with chest physical therapy and use of flutter valve. Physical therapy.   2. HTN. Continue with amlodipine, for blood pressure control.  3. GERD. On pantoprazole.  4. Anxiety. On as needed alprazolam.    5. Asthma. No clinical signs of acute exacerbation.   DVT prophylaxis: enoxaparin   Code Status: full Family Communication:  I spoke with patient's daughter over the phone all questions were addressed. Disposition Plan/ discharge barriers: pending clinical improvement, will need SNF.   Body mass index is 25.82 kg/m. Malnutrition Type:      Malnutrition Characteristics:      Nutrition Interventions:     RN Pressure Injury Documentation:     Consultants:     Procedures:     Antimicrobials:   Ceftriaxone #2    Subjective: Patient continue to have cough and dyspnea, productive cough, no nausea or vomiting, very weak and deconditioned.   Objective: Vitals:   06/14/19 1900 06/14/19 1929 06/15/19 0401 06/15/19 0700  BP:  138/65 (!) 149/68 139/71  Pulse:  73 61   Resp:  18    Temp: 98.9 F (37.2 C) 98.9 F (37.2 C) 98.4 F (36.9 C) 97.9 F (36.6 C)  TempSrc: Oral Oral Oral Oral  SpO2:  94% 98%   Weight:      Height:        Intake/Output Summary (Last 24 hours) at 06/15/2019 0932 Last data filed at 06/14/2019 2207 Gross per 24 hour  Intake 700 ml  Output 1500 ml  Net -800 ml   Filed Weights   06/09/19 1209  Weight: 72.6 kg    Examination:   General: Not in pain or dyspnea, deconditioned  Neurology: Awake  and alert, non focal  E ENT: mild  pallor, no icterus, oral mucosa moist Cardiovascular: No JVD. S1-S2 present, rhythmic, no gallops, rubs, or murmurs. No lower extremity edema. Pulmonary: positive breath sounds bilaterally. Positive rhonchi.  Gastrointestinal. Abdomen with, no organomegaly, non tender, no rebound or guarding Skin. No rashes Musculoskeletal: no joint  deformities     Data Reviewed: I have personally reviewed following labs and imaging studies  CBC: Recent Labs  Lab 06/11/19 0300 06/12/19 0325 06/13/19 0310 06/14/19 0213 06/15/19 0330  WBC 12.2* 9.4 8.7 10.3 10.7*  NEUTROABS 10.6* 7.8* 6.9 8.6* 8.6*  HGB 15.2 14.8 15.6 15.3 15.1  HCT 46.9 46.2 49.1 47.9 47.5  MCV 87.5 87.8 89.3 89.2 89.8  PLT 153 142* 143* 142* 122*   Basic Metabolic Panel: Recent Labs  Lab 06/10/19 0505 06/11/19 0300 06/12/19 0325 06/13/19 0310 06/14/19 0213  NA 138 138 137 138 135  K 4.7 4.7 4.7 4.7 4.3  CL 106 107 101 98 99  CO2 25 25 27 28 27   GLUCOSE 127* 156* 93 92 121*  BUN 23 33* 33* 35* 31*  CREATININE 0.78 0.86 0.78 0.86 0.77  CALCIUM 7.8* 8.3* 8.3* 8.4* 8.4*  MG 2.3 2.0 2.1 2.2 2.3  PHOS 3.2  --   --   --   --    GFR: Estimated Creatinine Clearance: 64.2 mL/min (by C-G formula based on SCr of 0.77 mg/dL). Liver Function Tests: Recent Labs  Lab 06/10/19 0505 06/11/19 0300 06/12/19 0325 06/13/19 0310 06/14/19 0213  AST 28 27 29 28  32  ALT 44 40 42 46* 59*  ALKPHOS 64 65 65 65 67  BILITOT 1.2 0.3 0.5 0.3 0.6  PROT 5.6* 5.3* 5.4* 5.8* 5.9*  ALBUMIN 2.5* 2.3* 2.6* 2.6* 2.8*   No results for input(s): LIPASE, AMYLASE in the last 168 hours. No results for input(s): AMMONIA in the last 168 hours. Coagulation Profile: No results for input(s): INR, PROTIME in the last 168 hours. Cardiac Enzymes: No results for input(s): CKTOTAL, CKMB, CKMBINDEX, TROPONINI in the last 168 hours. BNP (last 3 results) No results for input(s): PROBNP in the last 8760 hours. HbA1C: No results for input(s): HGBA1C in the last 72 hours. CBG: No results for input(s): GLUCAP in the last 168 hours. Lipid Profile: No results for input(s): CHOL, HDL, LDLCALC, TRIG, CHOLHDL, LDLDIRECT in the last 72 hours. Thyroid Function Tests: No results for input(s): TSH, T4TOTAL, FREET4, T3FREE, THYROIDAB in the last 72 hours. Anemia Panel: No results for  input(s): VITAMINB12, FOLATE, FERRITIN, TIBC, IRON, RETICCTPCT in the last 72 hours.    Radiology Studies: I have reviewed all of the imaging during this hospital visit personally     Scheduled Meds: . amLODipine  10 mg Oral Daily  . cholecalciferol  5,000 Units Oral q morning - 10a  . enoxaparin (LOVENOX) injection  40 mg Subcutaneous Q24H  . Ipratropium-Albuterol  1 puff Inhalation QID  . pantoprazole  40 mg Oral Daily  . polyethylene glycol  17 g Oral Daily  . vitamin C  1,000 mg Oral q morning - 10a   Continuous Infusions: . cefTRIAXone (ROCEPHIN)  IV 1 g (06/14/19 1314)     LOS: 6 days        Julyana Woolverton Gerome Apley, MD

## 2019-06-16 DIAGNOSIS — J159 Unspecified bacterial pneumonia: Secondary | ICD-10-CM

## 2019-06-16 NOTE — TOC Initial Note (Signed)
Transition of Care [facility]) - Initial/Assessment Note    Patient Details  Name: Alejandro Hicks MRN: [id number] Date of Birth: 16-Oct-1933  Transition of Care [facility]) CM/SW Contact:    Weston Anna, LCSW Phone Number: 06/16/2019, 3:45 PM  Clinical Narrative:                   CSW spoke with patients daughter, [name], regarding discharge plans. She prefers [facility]- facility has bed offer at this time. CSW started insurance authorization for facility and will continue to follow. No questions or concerns voiced by daughter at this time.   Expected Discharge Plan: Skilled Nursing Facility Barriers to Discharge: Insurance Authorization, Continued Medical Work up   Patient Goals and CMS Choice Patient states their goals for this hospitalization and ongoing recovery are:: getting back home CMS Medicare.gov Compare Post Acute Care list provided to:: Patient Represenative (must comment)(patient having difficultly talking due to covid) Choice offered to / list presented to : [place] / Guardian  Expected Discharge Plan and Services Expected Discharge Plan: [place] In-house Referral: Clinical Social Work     Living arrangements for the past 2 months: Single Family Home                 DME Arranged: N/A         HH Arranged: NA          Prior Living Arrangements/Services Living arrangements for the past 2 months: Single Family Home Lives with:: Self Patient language and need for interpreter reviewed:: Yes Do you feel safe going back to the place where you live?: Yes      Need for Family Participation in Patient Care: Yes (Comment) Care giver support system in place?: Yes (comment)      Activities of Daily Living Home Assistive Devices/Equipment: None ADL Screening (condition at time of admission) Patient's cognitive ability adequate to safely complete daily activities?: Yes Is the patient deaf or have difficulty hearing?: No Does the patient have  difficulty seeing, even when wearing glasses/contacts?: No Does the patient have difficulty concentrating, remembering, or making decisions?: No Patient able to express need for assistance with ADLs?: Yes Does the patient have difficulty dressing or bathing?: No Independently performs ADLs?: Yes (appropriate for developmental age) Does the patient have difficulty walking or climbing stairs?: No Weakness of Legs: None Weakness of Arms/Hands: None  Permission Sought/Granted                  Emotional Assessment           Psych Involvement: No (comment)  Admission diagnosis:  Acute respiratory failure with hypoxia ([place]) [J96.01] Pneumonia due to COVID-19 virus [U07.1, J12.89] COVID-19 [U07.1] Patient Active Problem List   Diagnosis Date Noted  . Pneumonia due to COVID-19 virus 06/09/2019  . GERD (gastroesophageal reflux disease) 06/09/2019  . Acute hypoxemic respiratory failure due to COVID-19 ([place]) 06/09/2019  . Elevated liver enzymes 06/09/2019  . Pneumonia due to 2019-nCoV 05/28/2019  . Primary osteoarthritis of right hip 12/13/2018  . Plantar fasciitis, right 02/16/2018  . Primary osteoarthritis of right ankle 01/19/2018  . Shoulder impingement syndrome, left 10/01/2017  . Rib pain on right side 12/09/2015  . Right shoulder pain with history of right proximal biceps rupture 12/09/2015  . Essential hypertension, benign 10/21/2015  . Lumbar degenerative disc disease 09/02/2015  . DDD (degenerative disc disease), cervical 08/05/2015   PCP:  Wenda Low, MD Pharmacy:   [id number] - [place],  -  [address] [place] [address] Phone: [phone] Fax: [phone]     Social Determinants of Health (SDOH) Interventions    Readmission Risk Interventions No flowsheet data found.

## 2019-06-16 NOTE — Care Management Important Message (Signed)
Important Message  Patient Details  Name: Alejandro Hicks MRN: [id number] Date of Birth: [birth year]   Medicare Important Message Given:  Yes - Important Message mailed due to current National Emergency  Verbal consent obtained due to current National Emergency  Relationship to patient: Child Contact Name: [name] Call Date: 06/16/19  Time: 1426 Phone: [phone] Outcome: Spoke with contact Important Message mailed to: Patient address on file    [place] 06/16/2019, 2:26 PM

## 2019-06-16 NOTE — Progress Notes (Signed)
PROGRESS NOTE    Alejandro Hicks  AJO:[id number] DOB: April 17, 1934 DOA: 06/09/2019 PCP: Wenda Low, MD    Brief Narrative:  85 year old male who presented with dyspnea and generalized weakness. She does have significant past medical history for hypertension, asthma, GERD, anxiety and tobacco abuse. Recently treated for SARS Covid 19 pneumonia October 3 to October 13. He was treated with remdesivir and systemic corticosteroids.At home he had worsening dry cough and dyspnea, he could not manage his home oxygen. On his initial physical examination blood pressure 161/67, pulse rate 73, respiratory 29, oxygen saturation 90%.His lungs are clear to auscultation bilaterally, heart S1-S2 present rhythm, abdomen soft, bowel extremity edema. Chest radiograph with bilateral alveolar infiltrates, both bases and right upper lobe. CT chest negative for pulmonary embolism, bilateral patchy infiltrates, predominantly right upper lobe with air bronchogram.  Patient was admitted to the hospital with acute on chronic hypoxic respiratory failure,in the setting of recent SARS COVID-19 viral pneumonia.  Patient continue to have increased oxygen requirements and persistent cough. In the setting of dense infiltrate on the right upper lobe (positive air bronchogram) will start patient on IV ceftriaxone, possible bacterial superinfection.  Patient continue to have cough and dyspnea, very weak and deconditioned, will plan to transfer to SNF on 06/19/19.    Assessment & Plan:   Principal Problem:   Acute hypoxemic respiratory failure due to COVID-19 [facility]) Active Problems:   Essential hypertension, benign   Pneumonia due to COVID-19 virus   GERD (gastroesophageal reflux disease)   Elevated liver enzymes    1. Acute hypoxic respiratory failure due to right upper lobe pneumonia, suspected bacterial superinfection, in the setting of recent viral pneumonia, SARS COVID 19.  Slowly decreasing his oxygen  requirements to 2 LPM per nasal cannula with oxygen saturation 95%. On ambulation still needs up to 5 LMP per [place]. Continue with IV ceftriaxone #3. On guaifenesin and airway cleaning techniques with chest physical therapy and use of flutter valve. Out of bed to chair as tolerated.  He tested positive for COVID 19 for the first time 05/25/19. Follow up PCR remained positive on 06/09/19.   2. HTN. Blood pressure 149.57, will continue with amlodipine.  3. GERD. Continue with pantoprazole.  4. Anxiety. PRN alprazolam. No agitation or confusion.   5. Asthma. Stable with exacerbation.   DVT prophylaxis:enoxaparin Code Status:full Family Communication: I spoke with patient's daughter over the phone all questions were addressed. Disposition Plan/ discharge barriers:pending clinical improvement, will need SNF.   Body mass index is 25.82 kg/m. Malnutrition Type:      Malnutrition Characteristics:      Nutrition Interventions:     RN Pressure Injury Documentation:     Consultants:     Procedures:     Antimicrobials:       Subjective: Dyspnea slowly improving, continue to need oxygen requirements up to 5 LPM when ambulating, no nausea or vomiting. Continue to have cough specially with movement.   Objective: Vitals:   06/15/19 0700 06/15/19 1500 06/15/19 1959 06/16/19 0446  BP: 139/71 112/68 (!) 121/59 (!) 129/57  Pulse:  71 73 63  Resp:  15 18 16   Temp: 97.9 F (36.6 C) 98.4 F (36.9 C) 98.6 F (37 C) 97.9 F (36.6 C)  TempSrc: Oral Oral Oral Oral  SpO2:  97% 95% 95%  Weight:      Height:        Intake/Output Summary (Last 24 hours) at 06/16/2019 0749 Last data filed at 06/16/2019 0400 Gross per 24  hour  Intake 940 ml  Output 2100 ml  Net -1160 ml   Filed Weights   06/09/19 1209  Weight: 72.6 kg    Examination:   General: Not in pain or dyspnea, deconditioned  Neurology: Awake and alert, non focal  E ENT: mild pallor, no icterus,  oral mucosa moist Cardiovascular: No JVD. S1-S2 present, rhythmic, no gallops, rubs, or murmurs. No lower extremity edema. Pulmonary: positive breath sounds bilaterally. Gastrointestinal. Abdomen flat, no organomegaly, non tender, no rebound or guarding Skin. No rashes Musculoskeletal: no joint deformities     Data Reviewed: I have personally reviewed following labs and imaging studies  CBC: Recent Labs  Lab 06/11/19 0300 06/12/19 0325 06/13/19 0310 06/14/19 0213 06/15/19 0330  WBC 12.2* 9.4 8.7 10.3 10.7*  NEUTROABS 10.6* 7.8* 6.9 8.6* 8.6*  HGB 15.2 14.8 15.6 15.3 15.1  HCT 46.9 46.2 49.1 47.9 47.5  MCV 87.5 87.8 89.3 89.2 89.8  PLT 153 142* 143* 142* 128*   Basic Metabolic Panel: Recent Labs  Lab 06/10/19 0505 06/11/19 0300 06/12/19 0325 06/13/19 0310 06/14/19 0213  NA 138 138 137 138 135  K 4.7 4.7 4.7 4.7 4.3  CL 106 107 101 98 99  CO2 25 25 27 28 27   GLUCOSE 127* 156* 93 92 121*  BUN 23 33* 33* 35* 31*  CREATININE 0.78 0.86 0.78 0.86 0.77  CALCIUM 7.8* 8.3* 8.3* 8.4* 8.4*  MG 2.3 2.0 2.1 2.2 2.3  PHOS 3.2  --   --   --   --    GFR: Estimated Creatinine Clearance: 64.2 mL/min (by C-G formula based on SCr of 0.77 mg/dL). Liver Function Tests: Recent Labs  Lab 06/10/19 0505 06/11/19 0300 06/12/19 0325 06/13/19 0310 06/14/19 0213  AST 28 27 29 28  32  ALT 44 40 42 46* 59*  ALKPHOS 64 65 65 65 67  BILITOT 1.2 0.3 0.5 0.3 0.6  PROT 5.6* 5.3* 5.4* 5.8* 5.9*  ALBUMIN 2.5* 2.3* 2.6* 2.6* 2.8*   No results for input(s): LIPASE, AMYLASE in the last 168 hours. No results for input(s): AMMONIA in the last 168 hours. Coagulation Profile: No results for input(s): INR, PROTIME in the last 168 hours. Cardiac Enzymes: No results for input(s): CKTOTAL, CKMB, CKMBINDEX, TROPONINI in the last 168 hours. BNP (last 3 results) No results for input(s): PROBNP in the last 8760 hours. HbA1C: No results for input(s): HGBA1C in the last 72 hours. CBG: No results for  input(s): GLUCAP in the last 168 hours. Lipid Profile: No results for input(s): CHOL, HDL, LDLCALC, TRIG, CHOLHDL, LDLDIRECT in the last 72 hours. Thyroid Function Tests: No results for input(s): TSH, T4TOTAL, FREET4, T3FREE, THYROIDAB in the last 72 hours. Anemia Panel: No results for input(s): VITAMINB12, FOLATE, FERRITIN, TIBC, IRON, RETICCTPCT in the last 72 hours.    Radiology Studies: I have reviewed all of the imaging during this hospital visit personally     Scheduled Meds: . amLODipine  10 mg Oral Daily  . cholecalciferol  5,000 Units Oral q morning - 10a  . enoxaparin (LOVENOX) injection  40 mg Subcutaneous Q24H  . Ipratropium-Albuterol  1 puff Inhalation QID  . pantoprazole  40 mg Oral Daily  . polyethylene glycol  17 g Oral Daily  . vitamin C  1,000 mg Oral q morning - 10a   Continuous Infusions: . cefTRIAXone (ROCEPHIN)  IV 1 g (06/15/19 1250)     LOS: 7 days        Dellas Guard , MD

## 2019-06-16 NOTE — Progress Notes (Signed)
Spoke with patient's daughter, [name], and updated her on patient's status.  Daughter wishes to [facility] with her dad later and will call back shortly to FaceTime with her and patient.  See chart for nsg assessment.

## 2019-06-17 DIAGNOSIS — J189 Pneumonia, unspecified organism: Secondary | ICD-10-CM

## 2019-06-17 MED ORDER — GUAIFENESIN-DM 100-10 MG/5ML PO SYRP
5.0000 mL | ORAL_SOLUTION | ORAL | Status: DC | PRN
  Administered 2019-06-19: 17:00:00 5 mL via ORAL
  Filled 2019-06-17: qty 10

## 2019-06-17 MED ORDER — HYDROCOD POLST-CPM POLST ER 10-8 MG/5ML PO SUER
5.0000 mL | Freq: Two times a day (BID) | ORAL | Status: DC
  Administered 2019-06-17 – 2019-06-22 (×10): 5 mL via ORAL
  Filled 2019-06-17 (×10): qty 5

## 2019-06-17 NOTE — Progress Notes (Addendum)
PROGRESS NOTE    Alejandro Hicks  LFY:[id number] DOB: 1934/07/11 DOA: 06/09/2019 PCP: Georgann Housekeeper, MD    Brief Narrative:  85 year old male who presented with dyspnea and generalized weakness. She does have significant past medical history for hypertension, asthma, GERD, anxiety and tobacco abuse. Recently treated for SARS Covid 19 pneumonia October 3 to October 13. He was treated with remdesivir and systemic corticosteroids.At home he had worsening dry cough and dyspnea, he could not manage his home oxygen. On his initial physical examination blood pressure 161/67, pulse rate 73, respiratory 29, oxygen saturation 90%.His lungs are clear to auscultation bilaterally, heart S1-S2 present rhythm, abdomen soft, bowel extremity edema. Chest radiograph with bilateral alveolar infiltrates, both bases and right upper lobe. CT chest negative for pulmonary embolism, bilateral patchy infiltrates, predominantly right upper lobe with air bronchogram.  Patient was admitted to the hospital with acute on chronic hypoxic respiratory failure,in the setting ofrecentSARS COVID-19 viral pneumonia.  CT chest was negative for pulmonary embolism, but shoed bilateral infiltrates, with dense infiltrate at the right upper lobe with air bronchogram.  Patient continue to have increased oxygen requirements and persistent cough. In the setting of dense infiltrate on the right upper lobe(positive air bronchogram) patient was started on IV ceftriaxone, possible bacterial superinfection.  Patient continue to have cough and dyspnea, very weak and deconditioned, will plan to transfer to SNF on 06/19/19   Assessment & Plan:   Principal Problem:   Acute hypoxemic respiratory failure due to COVID-19 [facility]) Active Problems:   Essential hypertension, benign   Pneumonia due to COVID-19 virus   GERD (gastroesophageal reflux disease)   Elevated liver enzymes    1. Acute hypoxic respiratory failure due to  right upper lobe pneumonia, suspected bacterial superinfection, in the setting of recent viral pneumonia, SARS COVID 19.He tested positive for COVID 19 for the first time 05/25/19. Follow up PCR remained positive on 06/09/19.   He has been maintaining oxygen requirements at 2 LPM per nasal cannula with oxygen saturation 94%. Antibiotic therapy with IV ceftriaxone #4/8. Continue with guaifenesin/ chlorpheniramine/ hydorcodone, bronchodilator therapy with albuterol and ipratropium plus airway cleaning techniques with chest PT and flutter valve. Pending placement at SNF for physical therapy. Out of bed tid with meals and physical therapy.   2. HTN.Blood pressure 118/63 mmHg. Will continueamlodipine for blood pressure control. Dc prn hydralazine for now.   3. GERD.Onpantoprazole.  4. Anxiety.Continue PRN alprazolam.  5. Asthma.No acute exacerbation.    DVT prophylaxis:enoxaparin Code Status:full Family Communication:no family at the bedside. Disposition Plan/ discharge barriers:pending clinical improvement, will need SNF.     Body mass index is 25.82 kg/m. Malnutrition Type:      Malnutrition Characteristics:      Nutrition Interventions:     RN Pressure Injury Documentation:     Consultants:     Procedures:     Antimicrobials:   Ceftriaxone.     Subjective: Patient is out of bed to the chair, continue to have worsening symptoms with exertion, no nausea or vomiting, no chest pain.   Objective: Vitals:   06/16/19 1927 06/17/19 0346 06/17/19 0727 06/17/19 0740  BP: (!) 143/64 (!) 126/59 115/64 118/69  Pulse: 79 69 65 60  Resp: 16 18 17    Temp: 99 F (37.2 C) 98.5 F (36.9 C) 98.5 F (36.9 C) 98.5 F (36.9 C)  TempSrc: Oral Oral Oral Oral  SpO2: 95% 93% 95% 94%  Weight:      Height:  Intake/Output Summary (Last 24 hours) at 06/17/2019 0827 Last data filed at 06/17/2019 2774 Gross per 24 hour  Intake 1820 ml  Output 1200  ml  Net 620 ml   Filed Weights   06/09/19 1209  Weight: 72.6 kg    Examination:   General: Not in pain or dyspnea, deconditioned  Neurology: Awake and alert, non focal  E ENT: mild pallor, no icterus, oral mucosa moist Cardiovascular: No JVD. S1-S2 present, rhythmic, no gallops, rubs, or murmurs. No lower extremity edema. Pulmonary: positive breath sounds bilaterally. Gastrointestinal. Abdomen with no organomegaly, non tender, no rebound or guarding Skin. No rashes Musculoskeletal: no joint deformities     Data Reviewed: I have personally reviewed following labs and imaging studies  CBC: Recent Labs  Lab 06/11/19 0300 06/12/19 0325 06/13/19 0310 06/14/19 0213 06/15/19 0330  WBC 12.2* 9.4 8.7 10.3 10.7*  NEUTROABS 10.6* 7.8* 6.9 8.6* 8.6*  HGB 15.2 14.8 15.6 15.3 15.1  HCT 46.9 46.2 49.1 47.9 47.5  MCV 87.5 87.8 89.3 89.2 89.8  PLT 153 142* 143* 142* 128*   Basic Metabolic Panel: Recent Labs  Lab 06/11/19 0300 06/12/19 0325 06/13/19 0310 06/14/19 0213  NA 138 137 138 135  K 4.7 4.7 4.7 4.3  CL 107 101 98 99  CO2 25 27 28 27   GLUCOSE 156* 93 92 121*  BUN 33* 33* 35* 31*  CREATININE 0.86 0.78 0.86 0.77  CALCIUM 8.3* 8.3* 8.4* 8.4*  MG 2.0 2.1 2.2 2.3   GFR: Estimated Creatinine Clearance: 64.2 mL/min (by C-G formula based on SCr of 0.77 mg/dL). Liver Function Tests: Recent Labs  Lab 06/11/19 0300 06/12/19 0325 06/13/19 0310 06/14/19 0213  AST 27 29 28  32  ALT 40 42 46* 59*  ALKPHOS 65 65 65 67  BILITOT 0.3 0.5 0.3 0.6  PROT 5.3* 5.4* 5.8* 5.9*  ALBUMIN 2.3* 2.6* 2.6* 2.8*   No results for input(s): LIPASE, AMYLASE in the last 168 hours. No results for input(s): AMMONIA in the last 168 hours. Coagulation Profile: No results for input(s): INR, PROTIME in the last 168 hours. Cardiac Enzymes: No results for input(s): CKTOTAL, CKMB, CKMBINDEX, TROPONINI in the last 168 hours. BNP (last 3 results) No results for input(s): PROBNP in the last 8760  hours. HbA1C: No results for input(s): HGBA1C in the last 72 hours. CBG: No results for input(s): GLUCAP in the last 168 hours. Lipid Profile: No results for input(s): CHOL, HDL, LDLCALC, TRIG, CHOLHDL, LDLDIRECT in the last 72 hours. Thyroid Function Tests: No results for input(s): TSH, T4TOTAL, FREET4, T3FREE, THYROIDAB in the last 72 hours. Anemia Panel: No results for input(s): VITAMINB12, FOLATE, FERRITIN, TIBC, IRON, RETICCTPCT in the last 72 hours.    Radiology Studies: I have reviewed all of the imaging during this hospital visit personally     Scheduled Meds: . amLODipine  10 mg Oral Daily  . cholecalciferol  5,000 Units Oral q morning - 10a  . enoxaparin (LOVENOX) injection  40 mg Subcutaneous Q24H  . Ipratropium-Albuterol  1 puff Inhalation QID  . pantoprazole  40 mg Oral Daily  . polyethylene glycol  17 g Oral Daily  . vitamin C  1,000 mg Oral q morning - 10a   Continuous Infusions: . cefTRIAXone (ROCEPHIN)  IV 1 g (06/16/19 1445)     LOS: 8 days         Gerome Apley, MD

## 2019-06-17 NOTE — Plan of Care (Signed)
  Problem: Education: Goal: Knowledge of risk factors and measures for prevention of condition will improve Outcome: Progressing   Problem: Coping: Goal: Psychosocial and spiritual needs will be supported Outcome: Progressing   Problem: Respiratory: Goal: Will maintain a patent airway Outcome: Progressing Goal: Complications related to the disease process, condition or treatment will be avoided or minimized Outcome: Progressing   Problem: Education: Goal: Knowledge of General Education information will improve Description: Including pain rating scale, medication(s)/side effects and non-pharmacologic comfort measures Outcome: Progressing   Problem: Health Behavior/Discharge Planning: Goal: Ability to manage health-related needs will improve Outcome: Progressing   Problem: Clinical Measurements: Goal: Ability to maintain clinical measurements within normal limits will improve Outcome: Progressing Goal: Will remain free from infection Outcome: Progressing Goal: Diagnostic test results will improve Outcome: Progressing Goal: Respiratory complications will improve Outcome: Progressing Goal: Cardiovascular complication will be avoided Outcome: Progressing   Problem: Activity: Goal: Risk for activity intolerance will decrease Outcome: Progressing   Problem: Nutrition: Goal: Adequate nutrition will be maintained Outcome: Progressing   Problem: Coping: Goal: Level of anxiety will decrease Outcome: Progressing   Problem: Elimination: Goal: Will not experience complications related to bowel motility Outcome: Progressing Goal: Will not experience complications related to urinary retention Outcome: Progressing   Problem: Pain Managment: Goal: General experience of comfort will improve Outcome: Progressing   Problem: Safety: Goal: Ability to remain free from injury will improve Outcome: Progressing   

## 2019-06-18 NOTE — Progress Notes (Signed)
PROGRESS NOTE    Alejandro Hicks  VOJ:[id number] DOB: 1934/05/29 DOA: 06/09/2019 PCP: Wenda Low, MD    Brief Narrative:  85 year old male who presented with dyspnea and generalized weakness. She does have significant past medical history for hypertension, asthma, GERD, anxiety and tobacco abuse. Recently treated for SARS Covid 19 pneumonia October 3 to October 13. He was treated with remdesivir and systemic corticosteroids.At home he had worsening dry cough and dyspnea, he could not manage his home oxygen. On his initial physical examination blood pressure 161/67, pulse rate 73, respiratory 29, oxygen saturation 90%.His lungs are clear to auscultation bilaterally, heart S1-S2 present rhythm, abdomen soft, bowel extremity edema. Chest radiograph with bilateral alveolar infiltrates, both bases and right upper lobe. CT chest negative for pulmonary embolism, bilateral patchy infiltrates, predominantly right upper lobe with air bronchogram.  Patient was admitted to the hospital with acute on chronic hypoxic respiratory failure,in the setting ofrecentSARS COVID-19 viral pneumonia.  Patient continue to have increased oxygen requirements and persistent cough. In the setting of dense infiltrate on the right upper lobe(positive air bronchogram)will start patient on IV ceftriaxone, possible bacterial superinfection.  Patient continue to have cough and dyspnea, very weak and deconditioned, will plan to transfer to SNF on 06/19/19.    Assessment & Plan:   Principal Problem:   Acute hypoxemic respiratory failure due to COVID-19 [facility]) Active Problems:   Essential hypertension, benign   Pneumonia due to COVID-19 virus   GERD (gastroesophageal reflux disease)   Elevated liver enzymes    1. Acute hypoxic respiratory failure due to right upper lobe pneumonia, suspected bacterial superinfection, in the setting of recent viral pneumonia, SARS COVID 19.He tested positive for COVID 19  for the first time 05/25/19. Follow up PCR remained positive on 06/09/19.   He has maintain his oxygen requirements to 2 LPM per nasal cannula with oxygen saturation 95%. Tolerating well  IV ceftriaxone #4/8. Continue with guaifenesin dm,  airway cleaning techniques  (chest physical therapy and use of flutter valve). Out of bed to chair as tolerated. Pending transfer to SNF.   2. HTN.Continue with amlodipine for blood pressure control.   3. GERD.Onpantoprazole.  4. Anxiety.Continue with as needed alprazolam. No agitation or confusion.   5. Asthma.Stable with no signs of acute exacerbation.   DVT prophylaxis:enoxaparin Code Status:full Family Communication:no family at the bedside  Disposition Plan/ discharge barriers:plan for SNF on 10.26 if bed available.     Body mass index is 25.82 kg/m. Malnutrition Type:      Malnutrition Characteristics:      Nutrition Interventions:     RN Pressure Injury Documentation:     Consultants:     Procedures:     Antimicrobials:   Ceftriaxone     Subjective: Patient is feeling better this am, no nausea or vomiting, improving cough and dyspnea, continue to be weak and deconditioned.   Objective: Vitals:   06/17/19 0740 06/17/19 1713 06/17/19 1951 06/18/19 0730  BP: 118/69 (!) 134/101 135/66 122/70  Pulse: 60 73 78 69  Resp:  19 18   Temp: 98.5 F (36.9 C) 98.3 F (36.8 C) 98.3 F (36.8 C) 98.4 F (36.9 C)  TempSrc: Oral Oral Oral Oral  SpO2: 94% 96% 93% 94%  Weight:      Height:        Intake/Output Summary (Last 24 hours) at 06/18/2019 0843 Last data filed at 06/17/2019 2120 Gross per 24 hour  Intake 450 ml  Output 300 ml  Net 150 ml  Filed Weights   06/09/19 1209  Weight: 72.6 kg    Examination:   General: Not in pain or dyspnea, deconditioned  Neurology: Awake and alert, non focal  E ENT: mild pallor, no icterus, oral mucosa moist Cardiovascular: No JVD. S1-S2 present,  rhythmic, no gallops, rubs, or murmurs. No lower extremity edema. Pulmonary: positive breath sounds bilaterally. Gastrointestinal. Abdomen with no organomegaly, non tender, no rebound or guarding Skin. No rashes Musculoskeletal: no joint deformities     Data Reviewed: I have personally reviewed following labs and imaging studies  CBC: Recent Labs  Lab 06/12/19 0325 06/13/19 0310 06/14/19 0213 06/15/19 0330  WBC 9.4 8.7 10.3 10.7*  NEUTROABS 7.8* 6.9 8.6* 8.6*  HGB 14.8 15.6 15.3 15.1  HCT 46.2 49.1 47.9 47.5  MCV 87.8 89.3 89.2 89.8  PLT 142* 143* 142* 128*   Basic Metabolic Panel: Recent Labs  Lab 06/12/19 0325 06/13/19 0310 06/14/19 0213  NA 137 138 135  K 4.7 4.7 4.3  CL 101 98 99  CO2 27 28 27   GLUCOSE 93 92 121*  BUN 33* 35* 31*  CREATININE 0.78 0.86 0.77  CALCIUM 8.3* 8.4* 8.4*  MG 2.1 2.2 2.3   GFR: Estimated Creatinine Clearance: 64.2 mL/min (by C-G formula based on SCr of 0.77 mg/dL). Liver Function Tests: Recent Labs  Lab 06/12/19 0325 06/13/19 0310 06/14/19 0213  AST 29 28 32  ALT 42 46* 59*  ALKPHOS 65 65 67  BILITOT 0.5 0.3 0.6  PROT 5.4* 5.8* 5.9*  ALBUMIN 2.6* 2.6* 2.8*   No results for input(s): LIPASE, AMYLASE in the last 168 hours. No results for input(s): AMMONIA in the last 168 hours. Coagulation Profile: No results for input(s): INR, PROTIME in the last 168 hours. Cardiac Enzymes: No results for input(s): CKTOTAL, CKMB, CKMBINDEX, TROPONINI in the last 168 hours. BNP (last 3 results) No results for input(s): PROBNP in the last 8760 hours. HbA1C: No results for input(s): HGBA1C in the last 72 hours. CBG: No results for input(s): GLUCAP in the last 168 hours. Lipid Profile: No results for input(s): CHOL, HDL, LDLCALC, TRIG, CHOLHDL, LDLDIRECT in the last 72 hours. Thyroid Function Tests: No results for input(s): TSH, T4TOTAL, FREET4, T3FREE, THYROIDAB in the last 72 hours. Anemia Panel: No results for input(s): VITAMINB12,  FOLATE, FERRITIN, TIBC, IRON, RETICCTPCT in the last 72 hours.    Radiology Studies: I have reviewed all of the imaging during this hospital visit personally     Scheduled Meds: . amLODipine  10 mg Oral Daily  . chlorpheniramine-HYDROcodone  5 mL Oral Q12H  . cholecalciferol  5,000 Units Oral q morning - 10a  . enoxaparin (LOVENOX) injection  40 mg Subcutaneous Q24H  . Ipratropium-Albuterol  1 puff Inhalation QID  . pantoprazole  40 mg Oral Daily  . polyethylene glycol  17 g Oral Daily  . vitamin C  1,000 mg Oral q morning - 10a   Continuous Infusions: . cefTRIAXone (ROCEPHIN)  IV 1 g (06/17/19 1346)     LOS: 9 days        Brok Stocking 06/19/19, MD

## 2019-06-19 LAB — BASIC METABOLIC PANEL
Anion gap: 10 (ref 5–15)
BUN: 25 mg/dL — ABNORMAL HIGH (ref 8–23)
CO2: 26 mmol/L (ref 22–32)
Calcium: 8.5 mg/dL — ABNORMAL LOW (ref 8.9–10.3)
Chloride: 99 mmol/L (ref 98–111)
Creatinine, Ser: 0.88 mg/dL (ref 0.61–1.24)
GFR calc Af Amer: >60 mL/min (ref >60)
GFR calc non Af Amer: >60 mL/min (ref >60)
Glucose, Bld: 94 mg/dL (ref 70–99)
Potassium: 4.6 mmol/L (ref 3.5–5.1)
Sodium: 135 mmol/L (ref 135–145)

## 2019-06-19 LAB — CBC WITH DIFFERENTIAL/PLATELET
Abs Immature Granulocytes: 0.09 10*3/uL — ABNORMAL HIGH (ref 0.00–0.07)
Basophils Absolute: 0 10*3/uL (ref 0.0–0.1)
Basophils Relative: 0 %
Eosinophils Absolute: 0.4 10*3/uL (ref 0.0–0.5)
Eosinophils Relative: 5 %
HCT: 46.6 % (ref 39.0–52.0)
Hemoglobin: 14.9 g/dL (ref 13.0–17.0)
Immature Granulocytes: 1 %
Lymphocytes Relative: 14 %
Lymphs Abs: 1.2 10*3/uL (ref 0.7–4.0)
MCH: 28.5 pg (ref 26.0–34.0)
MCHC: 32 g/dL (ref 30.0–36.0)
MCV: 89.3 fL (ref 80.0–100.0)
Monocytes Absolute: 0.6 10*3/uL (ref 0.1–1.0)
Monocytes Relative: 7 %
Neutro Abs: 6.2 10*3/uL (ref 1.7–7.7)
Neutrophils Relative %: 73 %
Platelets: 188 10*3/uL (ref 150–400)
RBC: 5.22 MIL/uL (ref 4.22–5.81)
RDW: 14.1 % (ref 11.5–15.5)
WBC: 8.5 10*3/uL (ref 4.0–10.5)
nRBC: 0 % (ref 0.0–0.2)

## 2019-06-19 NOTE — Progress Notes (Signed)
Physical Therapy Treatment Patient Details Name: Alejandro Hicks MRN: [id number] DOB: 08-Sep-1933 Today's Date: 06/19/2019    History of Present Illness 85 y/o male w/ hx of HTN, asthma, GERD, former smoker, anxiety, OSA, OA, dislipedemia, BPH, anxiety, presented to ED w/ SOB cough and gen weakness was admitted to [facility] 10/03-10/13 w/ COVID PNA. he developed acute hypoxemic resp failure sec to pna and was given remdesivir and steroid and d/c home on 02. 10/16 presented back to ED w/ worsening dry cough, SOB, chest pain, decreased appetite, weakness and lethargy.    PT Comments    Pt continues to need up to 6 L O2 [place] during gait (up from 4 L O2 [place] at rest) to maintain sats in the 90s.  He needed min assist today for balance during gait and is moving much slower than previously described.  O2 sats remained 90 or higher on 6 L O2 [place] as measured by peds finger probe with DOE 3/4 requiring one standing rest break and 10 mins of seated recovery at end of gait.  PT will continue to follow acutely for safe mobility progression   Follow Up Recommendations  SNF     Equipment Recommendations  Other (comment)(home O2)    Recommendations for Other Services   NA     Precautions / Restrictions Precautions Precautions: Fall;Other (comment) Precaution Comments: monitor sats    Mobility  Bed Mobility               General bed mobility comments: Pt was OOB in the recliner chair.   Transfers Overall transfer level: Needs assistance Equipment used: None Transfers: Sit to/from Stand Sit to Stand: Supervision         General transfer comment: supervision for safety and line management.   Ambulation/Gait Ambulation/Gait assistance: Min assist Gait Distance (Feet): 150 Feet Assistive device: (at times the hallway railing) Gait Pattern/deviations: Step-through pattern;Staggering left;Staggering right Gait velocity: decreased--he was going pretty slow today  Gait velocity interpretation:  1.31 - 2.62 ft/sec, indicative of limited community ambulator General Gait Details: Pt much more staggering gait pattern today requiring min assit for balance.  I encouraged use of the hallway railing for some stability.  O2 turned up to 6L O2 [place] during gait from 4 L O2 [place] at rest.  DOE 3/4 requiring one standing rest break for pursed lip breathing. Despite DOE, pt's O2 sats stayed 90 or higher on 6 L O2 [place] during gait.  It took him nearly 10 mins to recover his breathing (to be able to talk) after walking.  He was placed back on 4 L O2  after his recovery period with sats in the low 90s at rest.         Balance Overall balance assessment: Needs assistance Sitting-balance support: Feet supported;No upper extremity supported Sitting balance-Leahy Scale: Good     Standing balance support: No upper extremity supported Standing balance-Leahy Scale: Fair Standing balance comment: close supervision in standing, more unsteady on his feet today.                             Cognition Arousal/Alertness: Awake/alert Behavior During Therapy: Anxious Overall Cognitive Status: Within Functional Limits for tasks assessed  General Comments General comments (skin integrity, edema, etc.): Pt demonstrated IS use with max inspired volume with 8 reps ~600 mL.  Pt reports compliance with flutter valve and I left him with his exercises and t-band to practice once he felt his breathing was back to baseline.        Pertinent Vitals/Pain Pain Assessment: No/denies pain           PT Goals (current goals can now be found in the care plan section) Acute Rehab PT Goals Patient Stated Goal: go home safely Progress towards PT goals: Progressing toward goals    Frequency    Min 2X/week      PT Plan Current plan remains appropriate       AM-PAC PT "6 Clicks" Mobility   Outcome Measure  Help needed turning from your back to  your side while in a flat bed without using bedrails?: None Help needed moving from lying on your back to sitting on the side of a flat bed without using bedrails?: None Help needed moving to and from a bed to a chair (including a wheelchair)?: A Little Help needed standing up from a chair using your arms (e.g., wheelchair or bedside chair)?: None Help needed to walk in hospital room?: A Little Help needed climbing 3-5 steps with a railing? : A Little 6 Click Score: 21    End of Session Equipment Utilized During Treatment: Oxygen(4-6L [place]) Activity Tolerance: Other (comment)(limited by DOE) Patient left: in chair;with call bell/phone within reach   PT Visit Diagnosis: Other abnormalities of gait and mobility (R26.89)     Time: 5631-4970 PT Time Calculation (min) (ACUTE ONLY): 35 min  Charges:  $Gait Training: 8-22 mins $Therapeutic Activity: 8-22 mins                    Bobbyjo Marulanda B. Carlye Panameno, PT, DPT  Acute Rehabilitation [phone] pager [phone] office  @ [place]: [phone]   06/19/2019, 4:14 PM

## 2019-06-19 NOTE — Progress Notes (Signed)
SATURATION QUALIFICATIONS: (This note is used to comply with regulatory documentation for home oxygen)  Patient Saturations on Room Air at Rest = 87%  Patient Saturations on Room Air while Ambulating = NT due to 87 at rest%  Patient Saturations on 6 Liters of oxygen while Ambulating = 90%  Please briefly explain why patient needs home oxygen: Pt destaturates on RA at rest.    Wells Guiles B. Jacobs Golab, PT, DPT  Acute Rehabilitation [phone] pager [phone]) [phone] office  @ [place]: [phone]

## 2019-06-19 NOTE — Progress Notes (Addendum)
PROGRESS NOTE    Alejandro Hicks  WUJ:[id number] DOB: 03-Jan-1934 DOA: 06/09/2019 PCP: Georgann Housekeeper, MD    Brief Narrative:  85 year old male who presented with dyspnea and generalized weakness. She does have significant past medical history for hypertension, asthma, GERD, anxiety and tobacco abuse. Recently treated for SARS Covid 19 pneumonia October 3 toOctober13. He was treated with remdesivir and systemic corticosteroids.At home he had worsening dry cough and dyspnea, he could not manage his home oxygen. On his initial physical examination blood pressure 161/67, pulse rate 73, respiratory 29, oxygen saturation 90%.His lungs are clear to auscultation bilaterally, heart S1-S2 present rhythm, abdomen soft, bowel extremity edema. Chest radiograph with bilateral alveolar infiltrates, both bases and right upper lobe. CT chest negative for pulmonary embolism, bilateral patchy infiltrates, predominantly right upper lobe with air bronchogram.  Patient was admitted to the hospital with acute on chronic hypoxic respiratory failure,in the setting ofrecentSARS COVID-19 viral pneumonia.  Further work-up with CT angiography of his chest showed no evidence of pulmonary embolism, but multifocal areas of airspace consolidation, groundglass opacities and dense infiltrate in the right upper lobe with positive air bronchograms.  Patient continue to have increased oxygen requirements and persistent cough. In the setting of dense infiltrate on the right upper lobe(positive air bronchogram)he was started on IV ceftriaxone, for bacterial superinfection.  Patient clinically has been improving but continue to be very weak and deconditioned, will plan to transfer to SNF when bed is available.    Assessment & Plan:   Principal Problem:   Acute hypoxemic respiratory failure due to COVID-19 [facility]) Active Problems:   Essential hypertension, benign   Pneumonia due to COVID-19 virus   GERD  (gastroesophageal reflux disease)   Elevated liver enzymes    1. Acute hypoxic respiratory failure due to right upper lobe pneumonia,  bacterial superinfection, in the setting of recent viral pneumonia, SARS COVID 19.He tested positive for COVID 19 for the first time 05/25/19. Follow up PCR remained positive on 06/09/19.  Tolerating well supplemental 02 per [place] at 2LPM with oxygen saturation 92%.wbc at 8,5.Cultures have been with no growth for 5 days. Continue with IV ceftriaxone #5/8, can complete therapy with Augmentin po at discharge if SNF bed available before expected day of antibiotic discontinuation. On guaifenesin dm and airway cleaning techniques  (chest physical therapy and use of flutter valve). He has been of bed to chair.   2. HTN.On amlodipine for blood pressure control.   3. GERD.Continue withpantoprazole.  4. Anxiety.PRNalprazolam.Continue to have no agitation or confusion.  5. Asthma.No acute exacerbation.  DVT prophylaxis:enoxaparin Code Status:full Family Communication:no family at the bedside  Disposition Plan/ discharge barriers:plan for SNF when bed available.     Body mass index is 25.82 kg/m. Malnutrition Type:      Malnutrition Characteristics:      Nutrition Interventions:     RN Pressure Injury Documentation:     Consultants:     Procedures:     Antimicrobials:   Ceftriaxone IV     Subjective: Patient continue to improve in his symptoms, but continue to be very weak and deconditioned, tolerating po well, his dyspnea has improved but not jet back to baseline.   Objective: Vitals:   06/18/19 0730 06/18/19 1951 06/19/19 0459 06/19/19 0735  BP: 122/70 134/61 116/63 (!) 118/59  Pulse: 69 80 71 62  Resp:  19 17 20   Temp: 98.4 F (36.9 C) 97.9 F (36.6 C) 97.9 F (36.6 C) 98.3 F (36.8 C)  TempSrc: Oral Oral  Oral Oral  SpO2: 94% 95% 93% 92%  Weight:      Height:        Intake/Output Summary  (Last 24 hours) at 06/19/2019 0810 Last data filed at 06/19/2019 8099 Gross per 24 hour  Intake -  Output 200 ml  Net -200 ml   Filed Weights   06/09/19 1209  Weight: 72.6 kg    Examination:   General: Not in pain or dyspnea, deconditioned  Neurology: Awake and alert, non focal  E ENT: mild pallor, no icterus, oral mucosa moist Cardiovascular: No JVD. S1-S2 present, rhythmic, no gallops, rubs, or murmurs. No lower extremity edema. Pulmonary: positive breath sounds bilaterally. Gastrointestinal. Abdomen with no organomegaly, non tender, no rebound or guarding Skin. No rashes Musculoskeletal: no joint deformities     Data Reviewed: I have personally reviewed following labs and imaging studies  CBC: Recent Labs  Lab 06/13/19 0310 06/14/19 0213 06/15/19 0330 06/19/19 0255  WBC 8.7 10.3 10.7* 8.5  NEUTROABS 6.9 8.6* 8.6* 6.2  HGB 15.6 15.3 15.1 14.9  HCT 49.1 47.9 47.5 46.6  MCV 89.3 89.2 89.8 89.3  PLT 143* 142* 128* 833   Basic Metabolic Panel: Recent Labs  Lab 06/13/19 0310 06/14/19 0213 06/19/19 0255  NA 138 135 135  K 4.7 4.3 4.6  CL 98 99 99  CO2 28 27 26   GLUCOSE 92 121* 94  BUN 35* 31* 25*  CREATININE 0.86 0.77 0.88  CALCIUM 8.4* 8.4* 8.5*  MG 2.2 2.3  --    GFR: Estimated Creatinine Clearance: 58.4 mL/min (by C-G formula based on SCr of 0.88 mg/dL). Liver Function Tests: Recent Labs  Lab 06/13/19 0310 06/14/19 0213  AST 28 32  ALT 46* 59*  ALKPHOS 65 67  BILITOT 0.3 0.6  PROT 5.8* 5.9*  ALBUMIN 2.6* 2.8*   No results for input(s): LIPASE, AMYLASE in the last 168 hours. No results for input(s): AMMONIA in the last 168 hours. Coagulation Profile: No results for input(s): INR, PROTIME in the last 168 hours. Cardiac Enzymes: No results for input(s): CKTOTAL, CKMB, CKMBINDEX, TROPONINI in the last 168 hours. BNP (last 3 results) No results for input(s): PROBNP in the last 8760 hours. HbA1C: No results for input(s): HGBA1C in the last 72  hours. CBG: No results for input(s): GLUCAP in the last 168 hours. Lipid Profile: No results for input(s): CHOL, HDL, LDLCALC, TRIG, CHOLHDL, LDLDIRECT in the last 72 hours. Thyroid Function Tests: No results for input(s): TSH, T4TOTAL, FREET4, T3FREE, THYROIDAB in the last 72 hours. Anemia Panel: No results for input(s): VITAMINB12, FOLATE, FERRITIN, TIBC, IRON, RETICCTPCT in the last 72 hours.    Radiology Studies: I have reviewed all of the imaging during this hospital visit personally     Scheduled Meds: . amLODipine  10 mg Oral Daily  . chlorpheniramine-HYDROcodone  5 mL Oral Q12H  . cholecalciferol  5,000 Units Oral q morning - 10a  . enoxaparin (LOVENOX) injection  40 mg Subcutaneous Q24H  . Ipratropium-Albuterol  1 puff Inhalation QID  . pantoprazole  40 mg Oral Daily  . polyethylene glycol  17 g Oral Daily  . vitamin C  1,000 mg Oral q morning - 10a   Continuous Infusions: . cefTRIAXone (ROCEPHIN)  IV 1 g (06/18/19 1332)     LOS: 10 days         Gerome Apley, MD

## 2019-06-19 NOTE — Progress Notes (Signed)
Patient talking to daughter, [name], now on Facetime.  Updated Tara on patient's day.  [name] asking about discharge for patient.  Addressed all concerns.  See chart for nsg assessment.

## 2019-06-20 DIAGNOSIS — J9601 Acute respiratory failure with hypoxia: Secondary | ICD-10-CM

## 2019-06-20 DIAGNOSIS — U071 COVID-19: Secondary | ICD-10-CM

## 2019-06-20 NOTE — Progress Notes (Signed)
PROGRESS NOTE    Alejandro Hicks  WUJ:[id number] DOB: 1934-06-18 DOA: 06/09/2019 PCP: Wenda Low, MD    Brief Narrative:  85 year old male who presented with dyspnea and generalized weakness. She does have significant past medical history for hypertension, asthma, GERD, anxiety and tobacco abuse. Recently treated for SARS Covid 19 pneumonia October 3 toOctober13. He was treated with remdesivir and systemic corticosteroids.At home he had worsening dry cough and dyspnea, he could not manage his home oxygen. On his initial physical examination blood pressure 161/67, pulse rate 73, respiratory 29, oxygen saturation 90%.His lungs are clear to auscultation bilaterally, heart S1-S2 present rhythm, abdomen soft, bowel extremity edema. Chest radiograph with bilateral alveolar infiltrates, both bases and right upper lobe. CT chest negative for pulmonary embolism, bilateral patchy infiltrates, predominantly right upper lobe with air bronchogram.  Patient was admitted to the hospital with acute on chronic hypoxic respiratory failure,in the setting ofrecentSARS COVID-19 viral pneumonia.  Further work-up with CT angiography of his chest showed no evidence of pulmonary embolism, but multifocal areas of airspace consolidation, groundglass opacities and dense infiltrate in the right upper lobe with positive air bronchograms.  Patient continue to have increased oxygen requirements and persistent cough. In the setting of dense infiltrate on the right upper lobe(positive air bronchogram)he was started on IV ceftriaxone, for bacterial superinfection.  Patient clinically has been improving but continue to be very weak and deconditioned, will plan to transfer to SNF when bed is available.   He has been on 2 to 3 LPM per [place], with slow recovery. No leukocytosis and his cultures have been with no growth for 5 days.   Assessment & Plan:   Principal Problem:   Acute hypoxemic respiratory failure  due to COVID-19 [facility]) Active Problems:   Essential hypertension, benign   Pneumonia due to COVID-19 virus   GERD (gastroesophageal reflux disease)   Elevated liver enzymes      1. Acute hypoxic respiratory failure due to right upper lobe pneumonia,  bacterial superinfection, in the setting of recent viral pneumonia, SARS COVID 19.He tested positive for COVID 19 for the first time 05/25/19. Follow up PCR remained positive on 06/09/19.  Continue supplemental 02 per [place] at 2to 3 LPM with oxygen saturation 93%. Continue withIV ceftriaxone #6/8. Continue withguaifenesindm and airway cleaning techniques/ tolerating well chest physical therapy and use of flutter valve). Continue to encourage out bed to chair.    2. HTN.Blood pressure 124/55, will continue with amlodipinefor blood pressure control.  3. GERD.Onpantoprazole.  4. Anxiety. No agitation or confusion.  5. Asthma.No clinical signs of acuteexacerbation.  DVT prophylaxis:enoxaparin Code Status:full Family Communication:no family at the bedside Disposition Plan/ discharge barriers:plan for SNF when bed available.   Body mass index is 25.82 kg/m. Malnutrition Type:      Malnutrition Characteristics:      Nutrition Interventions:     RN Pressure Injury Documentation:     Consultants:     Procedures:     Antimicrobials:       Subjective: Patient is feeling better, but not yet at baseline, continue to be very weak and deconditioned, out of bed to the chair, continue with supplemental 02 per [place].   Objective: Vitals:   06/19/19 1537 06/19/19 1930 06/20/19 0403 06/20/19 0752  BP: 128/73 (!) 150/73 120/64 (!) 124/55  Pulse: 93 91 66 70  Resp: 20 18 18 18   Temp: 98.2 F (36.8 C) 98.4 F (36.9 C) 97.9 F (36.6 C) 98.8 F (37.1 C)  TempSrc: Oral Oral  Oral Oral  SpO2: 90% 90% 94% 93%  Weight:      Height:        Intake/Output Summary (Last 24 hours) at 06/20/2019  1000 Last data filed at 06/20/2019 0600 Gross per 24 hour  Intake 240 ml  Output 700 ml  Net -460 ml   Filed Weights   06/09/19 1209  Weight: 72.6 kg    Examination:   General: Not in pain or dyspnea, deconditioned  Neurology: Awake and alert, non focal  E ENT: mild pallor, no icterus, oral mucosa moist Cardiovascular: No JVD. S1-S2 present, rhythmic, no gallops, rubs, or murmurs. No lower extremity edema. Pulmonary: positive breath sounds bilaterally. Gastrointestinal. Abdomen with no organomegaly, non tender, no rebound or guarding Skin. No rashes Musculoskeletal: no joint deformities     Data Reviewed: I have personally reviewed following labs and imaging studies  CBC: Recent Labs  Lab 06/14/19 0213 06/15/19 0330 06/19/19 0255  WBC 10.3 10.7* 8.5  NEUTROABS 8.6* 8.6* 6.2  HGB 15.3 15.1 14.9  HCT 47.9 47.5 46.6  MCV 89.2 89.8 89.3  PLT 142* 128* 188   Basic Metabolic Panel: Recent Labs  Lab 06/14/19 0213 06/19/19 0255  NA 135 135  K 4.3 4.6  CL 99 99  CO2 27 26  GLUCOSE 121* 94  BUN 31* 25*  CREATININE 0.77 0.88  CALCIUM 8.4* 8.5*  MG 2.3  --    GFR: Estimated Creatinine Clearance: 58.4 mL/min (by C-G formula based on SCr of 0.88 mg/dL). Liver Function Tests: Recent Labs  Lab 06/14/19 0213  AST 32  ALT 59*  ALKPHOS 67  BILITOT 0.6  PROT 5.9*  ALBUMIN 2.8*   No results for input(s): LIPASE, AMYLASE in the last 168 hours. No results for input(s): AMMONIA in the last 168 hours. Coagulation Profile: No results for input(s): INR, PROTIME in the last 168 hours. Cardiac Enzymes: No results for input(s): CKTOTAL, CKMB, CKMBINDEX, TROPONINI in the last 168 hours. BNP (last 3 results) No results for input(s): PROBNP in the last 8760 hours. HbA1C: No results for input(s): HGBA1C in the last 72 hours. CBG: No results for input(s): GLUCAP in the last 168 hours. Lipid Profile: No results for input(s): CHOL, HDL, LDLCALC, TRIG, CHOLHDL, LDLDIRECT  in the last 72 hours. Thyroid Function Tests: No results for input(s): TSH, T4TOTAL, FREET4, T3FREE, THYROIDAB in the last 72 hours. Anemia Panel: No results for input(s): VITAMINB12, FOLATE, FERRITIN, TIBC, IRON, RETICCTPCT in the last 72 hours.    Radiology Studies: I have reviewed all of the imaging during this hospital visit personally     Scheduled Meds: . amLODipine  10 mg Oral Daily  . chlorpheniramine-HYDROcodone  5 mL Oral Q12H  . cholecalciferol  5,000 Units Oral q morning - 10a  . enoxaparin (LOVENOX) injection  40 mg Subcutaneous Q24H  . Ipratropium-Albuterol  1 puff Inhalation QID  . pantoprazole  40 mg Oral Daily  . polyethylene glycol  17 g Oral Daily  . vitamin C  1,000 mg Oral q morning - 10a   Continuous Infusions: . cefTRIAXone (ROCEPHIN)  IV 1 g (06/19/19 1600)     LOS: 11 days         Annett Gula, MD

## 2019-06-21 MED ORDER — PSYLLIUM 95 % PO PACK
1.0000 | PACK | Freq: Every day | ORAL | Status: DC
  Administered 2019-06-22: 09:00:00 1 via ORAL
  Filled 2019-06-21 (×3): qty 1

## 2019-06-21 MED ORDER — SENNOSIDES-DOCUSATE SODIUM 8.6-50 MG PO TABS
1.0000 | ORAL_TABLET | Freq: Every day | ORAL | Status: DC
  Administered 2019-06-21 – 2019-06-22 (×2): 1 via ORAL
  Filled 2019-06-21 (×2): qty 1

## 2019-06-21 NOTE — Progress Notes (Signed)
CSW spoke with patients daughter, [name], via phone and answered all concerns and questions regarding discharge planning.   Kingsley Spittle, LCSW [facility]  [phone]

## 2019-06-21 NOTE — Progress Notes (Addendum)
PROGRESS NOTE  Alejandro Hicks  SWN:[id number] DOB: 1934-01-20 DOA: 06/09/2019 PCP: Wenda Low, MD   Brief Narrative: Alejandro Hicks is an 85 year old male who presented with dyspnea and generalized weakness. She does have significant past medical history for hypertension, asthma, GERD, anxiety and tobacco abuse. Recently treated for SARS Covid 19 pneumonia October 3 toOctober13 with remdesivir and systemic corticosteroids prior to discharge. SNF was recommended at discharge, though he declined and returned home.At home he had worsening dry cough and dyspnea, he could not manage his home oxygen, so returned 10/16. On his initial physical examination blood pressure 161/67, pulse rate 73, respiratory 29, oxygen saturation 90%.His lungs are clear to auscultation bilaterally, heart S1-S2 present rhythm, abdomen soft, bowel extremity edema. Chest radiograph with bilateral alveolar infiltrates, both bases and right upper lobe.  Patient was admitted to the hospital with acute on chronic hypoxic respiratory failure,in the setting ofrecentSARS COVID-19 viral pneumonia.  Further work-up with CT angiography of his chest showed no evidence of pulmonary embolism,butmultifocal areas of airspace consolidation, groundglass opacities and dense infiltrate in the right upper lobe with positive air bronchograms. Doxycycline was given. Patient continued to have increased oxygen requirements and persistent cough. In the setting of dense infiltrate on the right upper lobe(positive air bronchogram)he wasstartedon ceftriaxone, forbacterial superinfection. Fever curve has remained flat with no leukocytosis or growth on cultures.  Patientclinically has continued improvement but continues to be hypoxic worse on exertion with concomitant deconditioning for which disposition to SNF is sought.  Assessment & Plan: Principal Problem:   Acute hypoxemic respiratory failure due to COVID-19 [facility]) Active  Problems:   Essential hypertension, benign   Pneumonia due to COVID-19 virus   GERD (gastroesophageal reflux disease)   Elevated liver enzymes   COVID-19   Acute respiratory failure with hypoxia (HCC)  Acute hypoxic respiratory failure due to covid-19 pneumonia and superimposed right upper lobe bacterial pneumonia: Initial +SARS-CoV-2 on 10/1. PCR remained positive on 06/09/19, and could expect some prolonged carriage given use of steroids. Has completed antibiotics for bacterial pneumonia as well.  - Continue supplemental oxygen with weaning as tolerated.  - Continue proning, incentive spirometry, OOB as much as possible and regular PT and OT.  HTN:  - Continue norvasc  GERD:  - Continuepantoprazole.  Anxiety. Noagitation or confusion. - Monitor  Asthma:No clinical signs of acuteexacerbation. - prn MDI  LFT elevation: Mild. - Monitor in AM  DVT prophylaxis: Lovenox Code Status: Full Family Communication: None at bedside Disposition Plan: SNF once bed available  Consultants:   None  Procedures:   None  Antimicrobials:  Ceftriaxone, doxycycline   Subjective: Ambulated with PT in the hall today with shortness of breath but made it farther than previously and felt more strong than before. No chest pain, leg swelling, orthopnea.  Objective: Vitals:   06/20/19 1616 06/20/19 1940 06/21/19 0337 06/21/19 0827  BP: 125/65 125/64 125/60 129/62  Pulse: 83 85 69 68  Resp: 17 16 19 17   Temp: 98.8 F (37.1 C) 98.9 F (37.2 C) 98.6 F (37 C) 98.3 F (36.8 C)  TempSrc: Oral Oral Oral Oral  SpO2: 93% 92% 94% 94%  Weight:      Height:        Intake/Output Summary (Last 24 hours) at 06/21/2019 1421 Last data filed at 06/21/2019 0826 Gross per 24 hour  Intake 240 ml  Output 1150 ml  Net -910 ml   Filed Weights   06/09/19 1209  Weight: 72.6 kg    Gen:  85 y.o. nontoxic male in no distress Pulm: Non-labored breathing 2L O2. Some bilateral crackles  without wheezes. CV: Regular rate and rhythm. No murmur, rub, or gallop. No JVD, no pedal edema. GI: Abdomen soft, non-tender, non-distended, with normoactive bowel sounds. No organomegaly or masses felt. Ext: Warm, no deformities Skin: No rashes, lesions or ulcers Neuro: Alert and oriented. No focal neurological deficits. Psych: Judgement and insight appear normal. Mood & affect appropriate.   Data Reviewed: I have personally reviewed following labs and imaging studies  CBC: Recent Labs  Lab 06/15/19 0330 06/19/19 0255  WBC 10.7* 8.5  NEUTROABS 8.6* 6.2  HGB 15.1 14.9  HCT 47.5 46.6  MCV 89.8 89.3  PLT 128* 188   Basic Metabolic Panel: Recent Labs  Lab 06/19/19 0255  NA 135  K 4.6  CL 99  CO2 26  GLUCOSE 94  BUN 25*  CREATININE 0.88  CALCIUM 8.5*   GFR: Estimated Creatinine Clearance: 58.4 mL/min (by C-G formula based on SCr of 0.88 mg/dL). Liver Function Tests: No results for input(s): AST, ALT, ALKPHOS, BILITOT, PROT, ALBUMIN in the last 168 hours. No results for input(s): LIPASE, AMYLASE in the last 168 hours. No results for input(s): AMMONIA in the last 168 hours. Coagulation Profile: No results for input(s): INR, PROTIME in the last 168 hours. Cardiac Enzymes: No results for input(s): CKTOTAL, CKMB, CKMBINDEX, TROPONINI in the last 168 hours. BNP (last 3 results) No results for input(s): PROBNP in the last 8760 hours. HbA1C: No results for input(s): HGBA1C in the last 72 hours. CBG: No results for input(s): GLUCAP in the last 168 hours. Lipid Profile: No results for input(s): CHOL, HDL, LDLCALC, TRIG, CHOLHDL, LDLDIRECT in the last 72 hours. Thyroid Function Tests: No results for input(s): TSH, T4TOTAL, FREET4, T3FREE, THYROIDAB in the last 72 hours. Anemia Panel: No results for input(s): VITAMINB12, FOLATE, FERRITIN, TIBC, IRON, RETICCTPCT in the last 72 hours. Urine analysis:    Component Value Date/Time   COLORURINE YELLOW 05/30/2011 2051    APPEARANCEUR CLEAR 05/30/2011 2051   LABSPEC 1.022 05/30/2011 2051   PHURINE 6.5 05/30/2011 2051   GLUCOSEU NEGATIVE 05/30/2011 2051   HGBUR NEGATIVE 05/30/2011 2051   BILIRUBINUR NEGATIVE 05/30/2011 2051   KETONESUR NEGATIVE 05/30/2011 2051   PROTEINUR NEGATIVE 05/30/2011 2051   UROBILINOGEN 0.2 05/30/2011 2051   NITRITE NEGATIVE 05/30/2011 2051   LEUKOCYTESUR NEGATIVE 05/30/2011 2051   No results found for this or any previous visit (from the past 240 hour(s)).    Radiology Studies: No results found.  Scheduled Meds: . amLODipine  10 mg Oral Daily  . chlorpheniramine-HYDROcodone  5 mL Oral Q12H  . cholecalciferol  5,000 Units Oral q morning - 10a  . enoxaparin (LOVENOX) injection  40 mg Subcutaneous Q24H  . Ipratropium-Albuterol  1 puff Inhalation QID  . pantoprazole  40 mg Oral Daily  . polyethylene glycol  17 g Oral Daily  . vitamin C  1,000 mg Oral q morning - 10a   Continuous Infusions:   LOS: 12 days   Time spent: 25 minutes.  Tyrone Nine, MD [facility] [email or web address] 06/21/2019, 2:21 PM

## 2019-06-21 NOTE — Progress Notes (Signed)
Physical Therapy Treatment Patient Details Name: Alejandro Hicks MRN: [id number] DOB: 02-01-1934 Today's Date: 06/21/2019    History of Present Illness 85 y/o male w/ hx of HTN, asthma, GERD, former smoker, anxiety, OSA, OA, dislipedemia, BPH, anxiety, presented to ED w/ SOB cough and gen weakness was admitted to [facility] 10/03-10/13 w/ COVID PNA. he developed acute hypoxemic resp failure sec to pna and was given remdesivir and steroid and d/c home on 02. 10/16 presented back to ED w/ worsening dry cough, SOB, chest pain, decreased appetite, weakness and lethargy.    PT Comments    Pt was able to use 5L/min  On [place] measured through finger probe, was able to ambulate approx 121ft with no AD and maintain sats in high 80s and low 90s. Pt also continues with flutter valve and incentive spirometer.  Follow Up Recommendations  SNF     Equipment Recommendations  Other (comment)    Recommendations for Other Services       Precautions / Restrictions Precautions Precautions: Fall;Other (comment) Precaution Comments: monitor sats Restrictions Weight Bearing Restrictions: No    Mobility  Bed Mobility Overal bed mobility: Modified Independent                Transfers Overall transfer level: Needs assistance Equipment used: None Transfers: Sit to/from Stand Sit to Stand: Supervision         General transfer comment: supervision for safety and line management.   Ambulation/Gait Ambulation/Gait assistance: Supervision   Assistive device: None Gait Pattern/deviations: Step-through pattern     General Gait Details: at rest pt is on 1.5L/min via [place] and sats in low 90s. with minimal movemnt pt is noted to have increased labour of breathing. and some coughing. with activity pt needs 5L/min via [place] and was able to ambulate approx 161ft very slowly. once completed ambulation sats in high 80s and low 90s and coughing persists.   Stairs             Wheelchair Mobility     Modified Rankin (Stroke Patients Only)       Balance Overall balance assessment: Needs assistance Sitting-balance support: Feet supported;No upper extremity supported Sitting balance-Leahy Scale: Good     Standing balance support: During functional activity Standing balance-Leahy Scale: Fair                              Cognition Arousal/Alertness: Awake/alert Behavior During Therapy: Anxious Overall Cognitive Status: Within Functional Limits for tasks assessed                                 General Comments: still needs cues for slowing down. took increased time to complete all tasks today and pt was able to maintain sats in high 80s and low 90s and also appears to be coughing less aftrewards.      Exercises      General Comments General comments (skin integrity, edema, etc.): Reinforced use of incentive spirometer and also flutter valve, noted some coughing with flutter valve. Pt able to ambulate 158ft with no AD, holds on to side rail at times, needs 5L/min via [place] for activity and was able to maintain sats in high 80s and low 90s. Pt looks extremely fatigued with mobility.      Pertinent Vitals/Pain Pain Assessment: Faces Faces Pain Scale: Hurts even more Pain Location: in chest when coughing Pain Descriptors /  Indicators: Grimacing;Discomfort Pain Intervention(s): Limited activity within patient's tolerance;Monitored during session    Home Living                      Prior Function            PT Goals (current goals can now be found in the care plan section) Acute Rehab PT Goals PT Goal Formulation: With patient Time For Goal Achievement: 06/24/19 Potential to Achieve Goals: Fair Progress towards PT goals: Progressing toward goals    Frequency    Min 2X/week      PT Plan Current plan remains appropriate    Co-evaluation              AM-PAC PT "6 Clicks" Mobility   Outcome Measure  Help needed turning  from your back to your side while in a flat bed without using bedrails?: None Help needed moving from lying on your back to sitting on the side of a flat bed without using bedrails?: None Help needed moving to and from a bed to a chair (including a wheelchair)?: A Little Help needed standing up from a chair using your arms (e.g., wheelchair or bedside chair)?: None Help needed to walk in hospital room?: A Little Help needed climbing 3-5 steps with a railing? : A Little 6 Click Score: 21    End of Session Equipment Utilized During Treatment: Oxygen Activity Tolerance: Treatment limited secondary to medical complications (Comment) Patient left: in chair;with call bell/phone within reach Nurse Communication: Mobility status PT Visit Diagnosis: Other abnormalities of gait and mobility (R26.89)     Time: 0925-1000 PT Time Calculation (min) (ACUTE ONLY): 35 min  Charges:  $Gait Training: 8-22 mins $Therapeutic Activity: 8-22 mins                     Horald Chestnut, PT    Delford Field 06/21/2019, 1:23 PM

## 2019-06-21 NOTE — Progress Notes (Signed)
Ok to dc ceftriaxone per Dr. Bonner Puna. Pt has gotten 8d of abx.   Onnie Boer, PharmD, BCIDP, AAHIVP, CPP Infectious Disease Pharmacist 06/21/2019 2:19 PM

## 2019-06-21 NOTE — Plan of Care (Signed)

## 2019-06-22 LAB — COMPREHENSIVE METABOLIC PANEL
ALT: 64 U/L — ABNORMAL HIGH (ref 0–44)
AST: 38 U/L (ref 15–41)
Albumin: 2.7 g/dL — ABNORMAL LOW (ref 3.5–5.0)
Alkaline Phosphatase: 96 U/L (ref 38–126)
Anion gap: 8 (ref 5–15)
BUN: 18 mg/dL (ref 8–23)
CO2: 29 mmol/L (ref 22–32)
Calcium: 8.4 mg/dL — ABNORMAL LOW (ref 8.9–10.3)
Chloride: 98 mmol/L (ref 98–111)
Creatinine, Ser: 0.93 mg/dL (ref 0.61–1.24)
GFR calc Af Amer: >60 mL/min (ref >60)
GFR calc non Af Amer: >60 mL/min (ref >60)
Glucose, Bld: 82 mg/dL (ref 70–99)
Potassium: 4.4 mmol/L (ref 3.5–5.1)
Sodium: 135 mmol/L (ref 135–145)
Total Bilirubin: 0.4 mg/dL (ref 0.3–1.2)
Total Protein: 6.4 g/dL — ABNORMAL LOW (ref 6.5–8.1)

## 2019-06-22 MED ORDER — ALPRAZOLAM 0.25 MG PO TABS: 0.2500 mg | ORAL_TABLET | Freq: Two times a day (BID) | ORAL | 0 refills | Status: DC | PRN

## 2019-06-22 MED ORDER — NYSTATIN 100000 UNIT/ML MT SUSP: 5.0000 mL | Freq: Four times a day (QID) | OROMUCOSAL | 0 refills | Status: DC

## 2019-06-22 MED ORDER — NYSTATIN 100000 UNIT/ML MT SUSP
5.0000 mL | Freq: Four times a day (QID) | OROMUCOSAL | Status: DC
  Administered 2019-06-22: 11:00:00 500000 [IU] via ORAL
  Filled 2019-06-22 (×6): qty 5

## 2019-06-22 NOTE — Progress Notes (Signed)
Nsg Discharge Note  Admit Date:  06/09/2019 Discharge date: 06/22/2019   KIRILL CHATTERJEE to be D/C'd Skilled nursing facility per MD order.  AVS completed.  Copy for chart, and copy for patient signed, and dated. Patient/caregiver able to verbalize understanding.  Discharge Medication: Allergies as of 06/22/2019      Reactions   Lipitor [atorvastatin] Other (See Comments)   Myalgia   Zocor [simvastatin] Other (See Comments)   Myalgia      Medication List    STOP taking these medications   azelastine 0.1 % nasal spray Commonly known as: ASTELIN   fluticasone 50 MCG/ACT nasal spray Commonly known as: FLONASE     TAKE these medications   acetaminophen 325 MG tablet Commonly known as: TYLENOL Take 650 mg by mouth every 6 (six) hours as needed for headache (pain).   AeroChamber Plus Flo-Vu Large Misc 1 each by Other route once.   albuterol 108 (90 Base) MCG/ACT inhaler Commonly known as: ProAir HFA Use 2 puffs every 4 hours as needed for cough and wheeze. May use 10-20 minutes before exercise What changed:   how much to take  how to take this  when to take this  reasons to take this  additional instructions   ALPRAZolam 0.25 MG tablet Commonly known as: XANAX Take 1 tablet (0.25 mg total) by mouth 2 (two) times daily as needed for anxiety.   Dexilant 60 MG capsule Generic drug: dexlansoprazole TAKE 1 CAPSULE (60 MG TOTAL) BY MOUTH EVERY MORNING. What changed: See the new instructions.   guaiFENesin-dextromethorphan 100-10 MG/5ML syrup Commonly known as: ROBITUSSIN DM Take 10 mLs by mouth every 6 (six) hours as needed for cough.   ibuprofen 200 MG tablet Commonly known as: ADVIL Take 400 mg by mouth every 6 (six) hours as needed for headache (pain).   nystatin 100000 UNIT/ML suspension Commonly known as: MYCOSTATIN Take 5 mLs (500,000 Units total) by mouth 4 (four) times daily.   OXYGEN Inhale into the lungs continuous.   Rhinocort Allergy 32 MCG/ACT  nasal spray Generic drug: budesonide Place 1 spray into both nostrils daily as needed for rhinitis.   testosterone cypionate 200 MG/ML injection Commonly known as: DEPOTESTOSTERONE CYPIONATE Inject 60 mg into the muscle once a week. 0.3 ml - 60 mg (inject into lateral thigh or buttock)   valsartan-hydrochlorothiazide 320-25 MG tablet Commonly known as: DIOVAN-HCT Take 1 tablet by mouth daily. What changed: when to take this   vitamin C 1000 MG tablet Take 1,000 mg by mouth every morning.   Vitamin D-3 125 MCG (5000 UT) Tabs Take 5,000 Units by mouth every morning.   VITAMIN K2 PO Take 1 tablet by mouth every morning.       Discharge Assessment: Vitals:   06/22/19 1359 06/22/19 1400  BP: 129/66 129/66  Pulse:  80  Resp:    Temp: 98.3 F (36.8 C)   SpO2:  91%   Skin clean, dry and intact without evidence of skin break down, no evidence of skin tears noted. IV catheter discontinued intact. Site without signs and symptoms of complications - no redness or edema noted at insertion site, patient denies c/o pain - only slight tenderness at site.  Dressing with slight pressure applied.  D/c Instructions-Education: Discharge instructions given to patient/family with verbalized understanding. D/c education completed with patient/family including follow up instructions, medication list, d/c activities limitations if indicated, with other d/c instructions as indicated by MD - patient able to verbalize understanding, all questions fully answered.  Patient instructed to return to ED, call 911, or call MD for any changes in condition.  Patient escorted via [place], and D/C home via private auto.  Eda Keys, RN 06/22/2019 4:02 PM

## 2019-06-22 NOTE — Care Management Important Message (Signed)
Important Message  Patient Details  Name: Alejandro Hicks MRN: [id number] Date of Birth: 25-Sep-1933   Medicare Important Message Given:  Yes - Important Message mailed due to current National Emergency  Verbal consent obtained due to current National Emergency  Relationship to patient: Child Contact Name: [name] Call Date: 06/22/19  Time: 1335 Phone: [phone] Outcome: Spoke with contact Important Message mailed to: Other (must enter comment)(Ms Jones declined 2nd copy, but is aware one is available if needed)    Delorse Lek 06/22/2019, 1:36 PM

## 2019-06-22 NOTE — Plan of Care (Signed)
Report called to [facility] to nurse [place], [place]

## 2019-06-22 NOTE — Progress Notes (Signed)
Update called to patients daughter

## 2019-06-22 NOTE — Discharge Summary (Addendum)
Physician Discharge Summary  Alejandro Hicks MRN:[id number] DOB: 10/20/1933 DOA: 06/09/2019  PCP: Alejandro Hicks:[id number], MD  Admit date: 06/09/2019 Discharge date: 06/22/2019  Admitted From: Home Disposition: SNF   Recommendations for Outpatient Follow-up:  1. Follow up with PCP in 1-2 weeks 2. Please obtain CMP in next 1-2 weeks to monitor LFTs  Home Health: N/A Equipment/Devices: Per SNF, requiring 2L O2, up to 4L with exertion Discharge Condition: Stable CODE STATUS: Full Diet recommendation: Heart healthy  Brief/Interim Summary: Alejandro Hicks is an [age] year old male who presented with dyspnea and generalized weakness. She does have significant past medical history for hypertension, asthma, GERD, anxiety and tobacco abuse. Recently treated for SARS Covid 19 pneumonia October 3 toOctober13 with remdesivir and systemic corticosteroids prior to discharge. SNF was recommended at discharge, though he declined and returned home.At home he had worsening dry cough and dyspnea, he could not manage his home oxygen, so returned 10/16. On his initial physical examination blood pressure 161/67, pulse rate 73, respiratory 29, oxygen saturation 90%.His lungs are clear to auscultation bilaterally, heart S1-S2 present rhythm, abdomen soft, bowel extremity edema. Chest radiograph with bilateral alveolar infiltrates, both bases and right upper lobe.  Patient was admitted to the hospital with acute on chronic hypoxic respiratory failure,in the setting ofrecentSARS COVID-19 viral pneumonia.  Further work-up with CT angiography of his chest showed no evidence of pulmonary embolism,butmultifocal areas of airspace consolidation, groundglass opacities and dense infiltrate in the right upper lobe with positive air bronchograms. Doxycycline was given. Patient continued to have increased oxygen requirements and persistent cough. In the setting of dense infiltrate on the right upper lobe(positive air  bronchogram)he wasstartedon ceftriaxone, forbacterial superinfection. Fever curve has remained flat with no leukocytosis or growth on cultures. Procalcitonin is undetectable and CRP has also normalized for several days in a row.   Patientclinically has continued improvement but continues to be hypoxic worse on exertion with concomitant deconditioning for which disposition to SNF is sought.  Discharge Diagnoses:  Principal Problem:   Acute hypoxemic respiratory failure due to COVID-19 [facility](HCC) Active Problems:   Essential hypertension, benign   Pneumonia due to COVID-19 virus   GERD (gastroesophageal reflux disease)   Elevated liver enzymes   COVID-19   Acute respiratory failure with hypoxia (HCC)  Acute hypoxic respiratory failure due to covid-19 pneumonia and superimposed right upper lobe bacterial pneumonia: Initial +SARS-CoV-2 on 10/1. PCR remained positive on 06/09/19, and could expect some prolonged carriage given use of steroids. Completed remdesivir during previous hospitalization. Has completed antibiotics for bacterial pneumonia as well.  - Continue supplemental oxygen with weaning as tolerated.  - Continue proning, incentive spirometry, OOB as much as possible and regular PT and OT. - Continue vitamin C, vitamin D, antitussive prn  HTN:  - Continue ARB-norvasc  GERD:  - ContinuePPI  Anxiety.Noagitation or confusion. - Continue home low dose alprazolam  Asthma:Noclinical signs ofacuteexacerbation. - prn albuterol MDI, intranasal budesonide  LFT elevation: Mild. - Monitor in AM  Thrush: Mild, related to steroid use and antibiotics most likely.  - Nystatin suspension.  Discharge Instructions  Allergies as of 06/22/2019      Reactions   Lipitor [atorvastatin] Other (See Comments)   Myalgia   Zocor [simvastatin] Other (See Comments)   Myalgia      Medication List    STOP taking these medications   azelastine 0.1 % nasal spray Commonly known  as: ASTELIN   fluticasone 50 MCG/ACT nasal spray Commonly known as: FLONASE     TAKE  these medications   acetaminophen 325 MG tablet Commonly known as: TYLENOL Take 650 mg by mouth every 6 (six) hours as needed for headache (pain).   AeroChamber Plus Flo-Vu Large Misc 1 each by Other route once.   albuterol 108 (90 Base) MCG/ACT inhaler Commonly known as: ProAir HFA Use 2 puffs every 4 hours as needed for cough and wheeze. May use 10-20 minutes before exercise What changed:   how much to take  how to take this  when to take this  reasons to take this  additional instructions   ALPRAZolam 0.25 MG tablet Commonly known as: XANAX Take 1 tablet (0.25 mg total) by mouth 2 (two) times daily as needed for anxiety.   Dexilant 60 MG capsule Generic drug: dexlansoprazole TAKE 1 CAPSULE (60 MG TOTAL) BY MOUTH EVERY MORNING. What changed: See the new instructions.   guaiFENesin-dextromethorphan 100-10 MG/5ML syrup Commonly known as: ROBITUSSIN DM Take 10 mLs by mouth every 6 (six) hours as needed for cough.   ibuprofen 200 MG tablet Commonly known as: ADVIL Take 400 mg by mouth every 6 (six) hours as needed for headache (pain).   nystatin 100000 UNIT/ML suspension Commonly known as: MYCOSTATIN Take 5 mLs (500,000 Units total) by mouth 4 (four) times daily.   OXYGEN Inhale into the lungs continuous.   Rhinocort Allergy 32 MCG/ACT nasal spray Generic drug: budesonide Place 1 spray into both nostrils daily as needed for rhinitis.   testosterone cypionate 200 MG/ML injection Commonly known as: DEPOTESTOSTERONE CYPIONATE Inject 60 mg into the muscle once a week. 0.3 ml - 60 mg (inject into lateral thigh or buttock)   valsartan-hydrochlorothiazide 320-25 MG tablet Commonly known as: DIOVAN-HCT Take 1 tablet by mouth daily. What changed: when to take this   vitamin C 1000 MG tablet Take 1,000 mg by mouth every morning.   Vitamin D-3 125 MCG (5000 UT) Tabs Take 5,000  Units by mouth every morning.   VITAMIN K2 PO Take 1 tablet by mouth every morning.      Follow-up Information    Wenda Low, MD Follow up.   Specialty: Internal Medicine Contact information: [address]  [place] [address] [phone]          Allergies  Allergen Reactions  . Lipitor [Atorvastatin] Other (See Comments)    Myalgia  . Zocor [Simvastatin] Other (See Comments)    Myalgia    Consultations:  None  Procedures/Studies: Dg Chest 2 View  Result Date: 05/27/2019 CLINICAL DATA:  Cough, covid EXAM: CHEST - 2 VIEW COMPARISON:  October 04, 2017 FINDINGS: The heart size and mediastinal contours are within normal limits. There is mildly increased hazy airspace opacity seen within the periphery of the right upper lung and the left lower lung. Aortic knob calcifications are seen. No acute osseous abnormality. IMPRESSION: Hazy peripheral airspace opacity is within both lungs. The findings in the lungs are nonspecific, but concerning for atypical infection, which includes viral pneumonia. Electronically Signed   By: Prudencio Pair M.D.   On: 05/27/2019 21:23   Ct Angio Chest Pe W Or Wo Contrast  Result Date: 06/09/2019 CLINICAL DATA:  PE suspected, high pretest probability, COVID-19 positive 1 week prior currently on 3 L home O2 EXAM: CT ANGIOGRAPHY CHEST WITH CONTRAST TECHNIQUE: Multidetector CT imaging of the chest was performed using the standard protocol during bolus administration of intravenous contrast. Multiplanar CT image reconstructions and MIPs were obtained to evaluate the vascular anatomy. CONTRAST:  139mL OMNIPAQUE IOHEXOL 350 MG/ML  SOLN COMPARISON:  Radiograph 06/09/2019 FINDINGS: Cardiovascular: Satisfactory opacification of the pulmonary arteries to the segmental level. No evidence of pulmonary embolism. Normal heart size. No pericardial effusion. Atherosclerotic calcification of the coronary arteries. Atherosclerotic plaque within the  normal caliber aorta. Normal 3 vessel branching of the aortic arch. No evident abnormality of proximal great vessels. Mediastinum/Nodes: Prominent low-attenuation mediastinal nodes are likely reactive. No pathologically enlarged mediastinal, hilar axillary adenopathy. Lungs/Pleura: There are multifocal areas airspace consolidation with surrounding patchy areas of ground-glass opacity and intervening air bronchograms. No pneumothorax or effusion. Some atelectatic volume loss is noted in the left lung. Upper Abdomen: Post cholecystectomy. No acute abnormalities present in the visualized portions of the upper abdomen. Musculoskeletal: Multilevel degenerative changes are present in the imaged portions of the spine. No acute osseous abnormality or suspicious osseous lesion. Mild body wall edema. Mild right gynecomastia. Review of the MIP images confirms the above findings. IMPRESSION: 1. No evidence of pulmonary embolism. 2. Multifocal areas of airspace consolidation with surrounding patchy areas of ground-glass opacity and intervening air bronchograms, compatible with multifocal pneumonia including COVID-19 etiology. 3. Aortic Atherosclerosis (ICD10-I70.0). Electronically Signed   By: Kreg Shropshire M.D.   On: 06/09/2019 18:20   Dg Chest Port 1 View  Result Date: 06/09/2019 CLINICAL DATA:  Cough, COVID-19 positive EXAM: PORTABLE CHEST 1 VIEW COMPARISON:  06/02/2019 FINDINGS: Stable cardiomediastinal contours. Significant interval progression of multifocal bilateral airspace consolidations, with confluent opacities most pronounced in the right upper lobe and left lower lobes. No pleural effusion or pneumothorax. IMPRESSION: Significant interval progression of multifocal pneumonia compared to prior. Electronically Signed   By: Duanne Guess M.D.   On: 06/09/2019 12:43   Dg Chest Port 1v Same Day  Result Date: 06/02/2019 CLINICAL DATA:  Shortness of breath EXAM: PORTABLE CHEST 1 VIEW COMPARISON:  Six days ago  FINDINGS: Patchy bilateral pneumonia with mild increase. Normal heart size accounting for elevated right diaphragm and technique. No edema, effusion, or pneumothorax IMPRESSION: COVID-19 with patchy bilateral pneumonia that is more conspicuous than on prior. Electronically Signed   By: Marnee Spring M.D.   On: 06/02/2019 08:55    Subjective: No new complaints. Dyspnea on exertion remains. Overall dyspnea has improved. No fevers or chest pain.  Discharge Exam: Vitals:   06/21/19 1907 06/22/19 0346  BP: (!) 144/68 100/76  Pulse: 86 71  Resp: 18 18  Temp: 98.4 F (36.9 C) 98.5 F (36.9 C)  SpO2: 92% 92%   General: Pt is alert, awake, not in acute distress Cardiovascular: RRR, S1/S2 +, no rubs, no gallops Respiratory: Nonlabored, crackles diffusely, similar to prior exam. Abdominal: Soft, NT, ND, bowel sounds + Extremities: No edema, no cyanosis  Labs: BNP (last 3 results) Recent Labs    06/12/19 0320 06/13/19 0310 06/14/19 0213  BNP 26.2 29.4 22.6   Basic Metabolic Panel: Recent Labs  Lab 06/19/19 0255 06/22/19 0400  NA 135 135  K 4.6 4.4  CL 99 98  CO2 26 29  GLUCOSE 94 82  BUN 25* 18  CREATININE 0.88 0.93  CALCIUM 8.5* 8.4*   Liver Function Tests: Recent Labs  Lab 06/22/19 0400  AST 38  ALT 64*  ALKPHOS 96  BILITOT 0.4  PROT 6.4*  ALBUMIN 2.7*   No results for input(s): LIPASE, AMYLASE in the last 168 hours. No results for input(s): AMMONIA in the last 168 hours. CBC: Recent Labs  Lab 06/19/19 0255  WBC 8.5  NEUTROABS 6.2  HGB 14.9  HCT 46.6  MCV  89.3  PLT 188   Cardiac Enzymes: No results for input(s): CKTOTAL, CKMB, CKMBINDEX, TROPONINI in the last 168 hours. BNP: Invalid input(s): POCBNP CBG: No results for input(s): GLUCAP in the last 168 hours. D-Dimer No results for input(s): DDIMER in the last 72 hours. Hgb A1c No results for input(s): HGBA1C in the last 72 hours. Lipid Profile No results for input(s): CHOL, HDL, LDLCALC, TRIG,  CHOLHDL, LDLDIRECT in the last 72 hours. Thyroid function studies No results for input(s): TSH, T4TOTAL, T3FREE, THYROIDAB in the last 72 hours.  Invalid input(s): FREET3 Anemia work up No results for input(s): VITAMINB12, FOLATE, FERRITIN, TIBC, IRON, RETICCTPCT in the last 72 hours. Urinalysis    Component Value Date/Time   COLORURINE YELLOW 05/30/2011 2051   APPEARANCEUR CLEAR 05/30/2011 2051   LABSPEC 1.022 05/30/2011 2051   PHURINE 6.5 05/30/2011 2051   GLUCOSEU NEGATIVE 05/30/2011 2051   HGBUR NEGATIVE 05/30/2011 2051   BILIRUBINUR NEGATIVE 05/30/2011 2051   KETONESUR NEGATIVE 05/30/2011 2051   PROTEINUR NEGATIVE 05/30/2011 2051   UROBILINOGEN 0.2 05/30/2011 2051   NITRITE NEGATIVE 05/30/2011 2051   LEUKOCYTESUR NEGATIVE 05/30/2011 2051    Microbiology No results found for this or any previous visit (from the past 240 hour(s)).  Time coordinating discharge: Approximately 40 minutes  Tyrone Nine, MD  [facility] 06/22/2019, 8:39 AM

## 2019-06-22 NOTE — TOC Transition Note (Signed)
Transition of Care [facility] : [facility]) - CM/SW Discharge Note   Patient Details  Name: Alejandro Hicks MRN: [id number] Date of Birth: [birth year]  Transition of Care [facility]) CM/SW Contact:  Weston Anna, LCSW Phone Number: 06/22/2019, 11:01 AM   Clinical Narrative:     Patient set to discharge to [facility] today- please call report to [phone]. CSW notified patients daughter, [name], of discharge- no concerns at this time. PTAR scheduled for 2:30PM.   Final next level of care: [place] Barriers to Discharge: No Barriers Identified   Patient Goals and CMS Choice Patient states their goals for this hospitalization and ongoing recovery are:: getting back home CMS Medicare.gov Compare Post Acute Care list provided to:: Patient Represenative (must comment)(patient having difficultly talking due to covid) Choice offered to / list presented to : [place] / [place]  Discharge Placement              Patient chooses bed at: [facility] Patient to be transferred to facility by: [place] Name of family member notified: [name] Patient and family notified of of transfer: 06/22/19  Discharge Plan and Services In-house Referral: Clinical Social Work              DME Arranged: N/A         HH Arranged: NA          Social Determinants of Health ([place]) Interventions     Readmission Risk Interventions No flowsheet data found.

## 2019-07-13 ENCOUNTER — Emergency Department (HOSPITAL_COMMUNITY): Payer: Medicare Other

## 2019-07-13 ENCOUNTER — Observation Stay (HOSPITAL_COMMUNITY)
Admission: EM | Admit: 2019-07-13 | Discharge: 2019-07-14 | Disposition: A | Discharge status: Home / Self Care | Payer: Medicare Other | Attending: Internal Medicine | Admitting: Internal Medicine

## 2019-07-13 ENCOUNTER — Other Ambulatory Visit: Payer: Self-pay

## 2019-07-13 DIAGNOSIS — R5381 Other malaise: Secondary | ICD-10-CM | POA: Diagnosis not present

## 2019-07-13 DIAGNOSIS — J9621 Acute and chronic respiratory failure with hypoxia: Secondary | ICD-10-CM | POA: Diagnosis not present

## 2019-07-13 DIAGNOSIS — F419 Anxiety disorder, unspecified: Secondary | ICD-10-CM | POA: Insufficient documentation

## 2019-07-13 DIAGNOSIS — U071 COVID-19: Secondary | ICD-10-CM | POA: Diagnosis not present

## 2019-07-13 DIAGNOSIS — E785 Hyperlipidemia, unspecified: Secondary | ICD-10-CM | POA: Insufficient documentation

## 2019-07-13 DIAGNOSIS — N400 Enlarged prostate without lower urinary tract symptoms: Secondary | ICD-10-CM | POA: Diagnosis not present

## 2019-07-13 DIAGNOSIS — G4733 Obstructive sleep apnea (adult) (pediatric): Secondary | ICD-10-CM | POA: Diagnosis not present

## 2019-07-13 DIAGNOSIS — Z79899 Other long term (current) drug therapy: Secondary | ICD-10-CM | POA: Diagnosis not present

## 2019-07-13 DIAGNOSIS — I10 Essential (primary) hypertension: Secondary | ICD-10-CM | POA: Insufficient documentation

## 2019-07-13 DIAGNOSIS — Z9981 Dependence on supplemental oxygen: Secondary | ICD-10-CM | POA: Insufficient documentation

## 2019-07-13 DIAGNOSIS — D72829 Elevated white blood cell count, unspecified: Secondary | ICD-10-CM | POA: Diagnosis not present

## 2019-07-13 DIAGNOSIS — K219 Gastro-esophageal reflux disease without esophagitis: Secondary | ICD-10-CM | POA: Insufficient documentation

## 2019-07-13 DIAGNOSIS — J45909 Unspecified asthma, uncomplicated: Secondary | ICD-10-CM | POA: Diagnosis not present

## 2019-07-13 DIAGNOSIS — E46 Unspecified protein-calorie malnutrition: Secondary | ICD-10-CM | POA: Diagnosis not present

## 2019-07-13 DIAGNOSIS — R0602 Shortness of breath: Secondary | ICD-10-CM | POA: Diagnosis present

## 2019-07-13 DIAGNOSIS — Z87891 Personal history of nicotine dependence: Secondary | ICD-10-CM | POA: Insufficient documentation

## 2019-07-13 DIAGNOSIS — J9601 Acute respiratory failure with hypoxia: Secondary | ICD-10-CM

## 2019-07-13 LAB — CBC WITH DIFFERENTIAL/PLATELET
Abs Immature Granulocytes: 0.33 10*3/uL — ABNORMAL HIGH (ref 0.00–0.07)
Basophils Absolute: 0.1 10*3/uL (ref 0.0–0.1)
Basophils Relative: 0 %
Eosinophils Absolute: 1.2 10*3/uL — ABNORMAL HIGH (ref 0.0–0.5)
Eosinophils Relative: 7 %
HCT: 50.9 % (ref 39.0–52.0)
Hemoglobin: 16.7 g/dL (ref 13.0–17.0)
Immature Granulocytes: 2 %
Lymphocytes Relative: 10 %
Lymphs Abs: 1.8 10*3/uL (ref 0.7–4.0)
MCH: 29.1 pg (ref 26.0–34.0)
MCHC: 32.8 g/dL (ref 30.0–36.0)
MCV: 88.8 fL (ref 80.0–100.0)
Monocytes Absolute: 1.3 10*3/uL — ABNORMAL HIGH (ref 0.1–1.0)
Monocytes Relative: 7 %
Neutro Abs: 13.1 10*3/uL — ABNORMAL HIGH (ref 1.7–7.7)
Neutrophils Relative %: 74 %
Platelets: 212 10*3/uL (ref 150–400)
RBC: 5.73 MIL/uL (ref 4.22–5.81)
RDW: 14.6 % (ref 11.5–15.5)
WBC: 17.8 10*3/uL — ABNORMAL HIGH (ref 4.0–10.5)
nRBC: 0 % (ref 0.0–0.2)

## 2019-07-13 LAB — COMPREHENSIVE METABOLIC PANEL
ALT: 46 U/L — ABNORMAL HIGH (ref 0–44)
AST: 28 U/L (ref 15–41)
Albumin: 3.1 g/dL — ABNORMAL LOW (ref 3.5–5.0)
Alkaline Phosphatase: 72 U/L (ref 38–126)
Anion gap: 10 (ref 5–15)
BUN: 22 mg/dL (ref 8–23)
CO2: 27 mmol/L (ref 22–32)
Calcium: 8.9 mg/dL (ref 8.9–10.3)
Chloride: 97 mmol/L — ABNORMAL LOW (ref 98–111)
Creatinine, Ser: 1.06 mg/dL (ref 0.61–1.24)
GFR calc Af Amer: >60 mL/min (ref >60)
GFR calc non Af Amer: >60 mL/min (ref >60)
Glucose, Bld: 104 mg/dL — ABNORMAL HIGH (ref 70–99)
Potassium: 4.4 mmol/L (ref 3.5–5.1)
Sodium: 134 mmol/L — ABNORMAL LOW (ref 135–145)
Total Bilirubin: 0.6 mg/dL (ref 0.3–1.2)
Total Protein: 6.7 g/dL (ref 6.5–8.1)

## 2019-07-13 LAB — POC SARS CORONAVIRUS 2 AG -  ED: SARS Coronavirus 2 Ag: NEGATIVE

## 2019-07-13 MED ORDER — DEXAMETHASONE SODIUM PHOSPHATE 10 MG/ML IJ SOLN
10.0000 mg | Freq: Once | INTRAMUSCULAR | Status: AC
  Administered 2019-07-13: 22:00:00 10 mg via INTRAVENOUS
  Filled 2019-07-13: qty 1

## 2019-07-13 MED ORDER — ALBUTEROL SULFATE HFA 108 (90 BASE) MCG/ACT IN AERS
6.0000 | INHALATION_SPRAY | Freq: Once | RESPIRATORY_TRACT | Status: AC
  Administered 2019-07-13: 6 via RESPIRATORY_TRACT
  Filled 2019-07-13: qty 6.7

## 2019-07-13 MED ORDER — IOHEXOL 350 MG/ML SOLN
100.0000 mL | Freq: Once | INTRAVENOUS | Status: AC | PRN
  Administered 2019-07-13: 100 mL via INTRAVENOUS

## 2019-07-13 MED ORDER — MAGNESIUM SULFATE 2 GM/50ML IV SOLN
2.0000 g | Freq: Once | INTRAVENOUS | Status: AC
  Administered 2019-07-14: 01:00:00 2 g via INTRAVENOUS
  Filled 2019-07-13: qty 50

## 2019-07-13 NOTE — ED Notes (Signed)
Pt sob w/ exertion. Sp02 88%; placed on 6LPM, Sp02 now 94%.

## 2019-07-13 NOTE — ED Notes (Signed)
[place] pts daughter wants a call

## 2019-07-13 NOTE — ED Notes (Signed)
ED Provider at bedside. 

## 2019-07-13 NOTE — ED Provider Notes (Signed)
[facility] EMERGENCY DEPARTMENT Provider Note   CSN: [id number] Arrival date & time: 07/13/19  1843     History   Chief Complaint Chief Complaint  Patient presents with   Shortness of Breath    HPI Alejandro Hicks is a 85 y.o. male.     HPI  Pt is an 85 year old male with PMH of Asthma, BPH, GERD, OSA, Hx of Covid 19 in Sept, now on 3L O2 [place] at home who presents to the ED with concern for shortness of breath. Per patient history and chart review, patient was first diagnosed with Covid at the end of September.  He was admitted to the hospital twice in October for hypoxic respiratory failure.  Patient was most recently discharged on 10/29.  During that time, patient was treated with steroids and ( completed remdesivir upon initial hospitalization) per chart review. Completed Abx for superimposed PNA at most recent admission. Patient reports he has been undergoing physical therapy and has been getting back on his feet and had gotten his appetite back and was starting to feel like himself.  However, starting yesterday, patient developed worsening cough and worsening shortness of breath.  Since discharge, patient has been on 3 L nasal cannula at home, but historically was not on oxygen in the past.  Patient reports he has had worsening shortness of breath when he is up moving around such as going to the bathroom.  He states his daughter had to increase his oxygen at home when he would move around.  Patient denies any fever.  No new sick contacts.  He denies any chest pain.  Patient denies any leg pain or swelling.  No prior history of blood clots.  Past Medical History:  Diagnosis Date   Allergic rhinitis    Anxiety    Asthma    BPH (benign prostatic hypertrophy)    Colonic polyp    Dyslipidemia    ED (erectile dysfunction)    GERD (gastroesophageal reflux disease)    Inguinal hernia    Left S/P repair in 2012   Leukoplakia of vocal cords    OA  (osteoarthritis)    OSA (obstructive sleep apnea)    Does not use CPAP   Reflux laryngitis    ENT ecal, PPIs twice a day   Rosacea     Patient Active Problem List   Diagnosis Date Noted   COVID-19    Acute respiratory failure with hypoxia (HCC)    Pneumonia due to COVID-19 virus 06/09/2019   GERD (gastroesophageal reflux disease) 06/09/2019   Acute hypoxemic respiratory failure due to COVID-19 (HCC) 06/09/2019   Elevated liver enzymes 06/09/2019   Pneumonia due to 2019-nCoV 05/28/2019   Primary osteoarthritis of right hip 12/13/2018   Plantar fasciitis, right 02/16/2018   Primary osteoarthritis of right ankle 01/19/2018   Shoulder impingement syndrome, left 10/01/2017   Rib pain on right side 12/09/2015   Right shoulder pain with history of right proximal biceps rupture 12/09/2015   Essential hypertension, benign 10/21/2015   Lumbar degenerative disc disease 09/02/2015   DDD (degenerative disc disease), cervical 08/05/2015    Past Surgical History:  Procedure Laterality Date   COLONOSCOPY  2009   INGUINAL HERNIA REPAIR Left    2012        Home Medications    Prior to Admission medications   Medication Sig Start Date End Date Taking? Authorizing Provider  OXYGEN Inhale 3 L/min into the lungs continuous.    Yes  [provider]  acetaminophen (TYLENOL) 325 MG tablet Take 650 mg by mouth every 6 (six) hours as needed for headache (pain).    [provider]  albuterol (PROAIR HFA) 108 (90 Base) MCG/ACT inhaler Use 2 puffs every 4 hours as needed for cough and wheeze. May use 10-20 minutes before exercise Patient taking differently: Inhale 2 puffs into the lungs every 4 (four) hours as needed for wheezing (cough or 10-20 minutes before exercise).  02/09/17   Kozlow, Donnamarie Poag, MD  ALPRAZolam Duanne Moron) 0.25 MG tablet Take 1 tablet (0.25 mg total) by mouth 2 (two) times daily as needed for anxiety. 06/22/19   Patrecia Pour, MD  Ascorbic Acid  (VITAMIN C) 1000 MG tablet Take 1,000 mg by mouth every morning.    [provider]  budesonide (RHINOCORT ALLERGY) 32 MCG/ACT nasal spray Place 1 spray into both nostrils daily as needed for rhinitis.    [provider]  Cholecalciferol (VITAMIN D-3) 125 MCG (5000 UT) TABS Take 5,000 Units by mouth every morning.    [provider]  DEXILANT 60 MG capsule TAKE 1 CAPSULE (60 MG TOTAL) BY MOUTH EVERY MORNING. Patient taking differently: Take 60 mg by mouth every morning.  12/23/17   Kozlow, Donnamarie Poag, MD  guaiFENesin-dextromethorphan (ROBITUSSIN DM) 100-10 MG/5ML syrup Take 10 mLs by mouth every 6 (six) hours as needed for cough. 06/06/19   Thurnell Lose, MD  ibuprofen (ADVIL) 200 MG tablet Take 400 mg by mouth every 6 (six) hours as needed for headache (pain).    [provider]  Menaquinone-7 (VITAMIN K2 PO) Take 1 tablet by mouth every morning.     [provider]  nystatin (MYCOSTATIN) 100000 UNIT/ML suspension Take 5 mLs (500,000 Units total) by mouth 4 (four) times daily. 06/22/19   Patrecia Pour, MD  Spacer/Aero-Holding Chambers (AEROCHAMBER PLUS FLO-VU LARGE) MISC 1 each by Other route once. 11/11/15   Gean Quint, MD  testosterone cypionate (DEPOTESTOSTERONE CYPIONATE) 200 MG/ML injection Inject 60 mg into the muscle once a week. 0.3 ml - 60 mg (inject into lateral thigh or buttock) 01/26/17   [provider]  valsartan-hydrochlorothiazide (DIOVAN-HCT) 320-25 MG tablet Take 1 tablet by mouth daily. Patient taking differently: Take 1 tablet by mouth every morning.  05/15/19   Silverio Decamp, MD    Family History History reviewed. No pertinent family history.  Social History Social History   Tobacco Use   Smoking status: Former Smoker   Smokeless tobacco: Never Used   Tobacco comment: Quit 1970  Substance Use Topics   Alcohol use: Yes    Comment: social   Drug use: No     Allergies   Lipitor [atorvastatin] and  Zocor [simvastatin]   Review of Systems Review of Systems  Constitutional: Negative for chills and fever.  HENT: Negative for sore throat.   Eyes: Negative for pain and visual disturbance.  Respiratory: Positive for cough and shortness of breath.   Cardiovascular: Negative for chest pain and palpitations.  Gastrointestinal: Negative for abdominal pain and vomiting.  Genitourinary: Negative for dysuria and hematuria.  Musculoskeletal: Negative for arthralgias and back pain.  Skin: Negative for color change and rash.  Neurological: Negative for seizures and syncope.  Psychiatric/Behavioral: Negative for agitation and behavioral problems.  All other systems reviewed and are negative.    Physical Exam Updated Vital Signs BP (!) 117/55    Pulse 87    Temp 98.3 F (36.8 C) (Oral)  Resp (!) 25    SpO2 94%   Physical Exam Vitals signs and nursing note reviewed.  Constitutional:      Appearance: He is well-developed.  HENT:     Head: Normocephalic and atraumatic.     Mouth/Throat:     Mouth: Mucous membranes are moist.     Pharynx: Oropharynx is clear.  Eyes:     Conjunctiva/sclera: Conjunctivae normal.  Neck:     Musculoskeletal: Neck supple.  Cardiovascular:     Rate and Rhythm: Normal rate and regular rhythm.     Heart sounds: No murmur.  Pulmonary:     Effort: Respiratory distress (Tachynpeic, speaks in short phrases but appear comfortable at rest when not speaking) present.     Breath sounds: Wheezing (Scattered (R>L)) and rhonchi (Diffuse) present.  Abdominal:     Palpations: Abdomen is soft.     Tenderness: There is no abdominal tenderness.  Musculoskeletal:        General: No deformity or signs of injury.     Right lower leg: No edema.     Left lower leg: No edema.  Skin:    General: Skin is warm and dry.  Neurological:     General: No focal deficit present.     Mental Status: He is alert and oriented to person, place, and time.      ED Treatments /  Results  Labs (all labs ordered are listed, but only abnormal results are displayed) Labs Reviewed  SARS CORONAVIRUS 2 (TAT 6-24 HRS) - Abnormal; Notable for the following components:      Result Value   SARS Coronavirus 2 POSITIVE (*)    All other components within normal limits  CBC WITH DIFFERENTIAL/PLATELET - Abnormal; Notable for the following components:   WBC 17.8 (*)    Neutro Abs 13.1 (*)    Monocytes Absolute 1.3 (*)    Eosinophils Absolute 1.2 (*)    Abs Immature Granulocytes 0.33 (*)    All other components within normal limits  COMPREHENSIVE METABOLIC PANEL - Abnormal; Notable for the following components:   Sodium 134 (*)    Chloride 97 (*)    Glucose, Bld 104 (*)    Albumin 3.1 (*)    ALT 46 (*)    All other components within normal limits  COMPREHENSIVE METABOLIC PANEL - Abnormal; Notable for the following components:   Glucose, Bld 158 (*)    Calcium 8.1 (*)    Total Protein 5.9 (*)    Albumin 2.6 (*)    All other components within normal limits  CBC WITH DIFFERENTIAL/PLATELET - Abnormal; Notable for the following components:   WBC 11.7 (*)    Neutro Abs 10.7 (*)    Lymphs Abs 0.6 (*)    Abs Immature Granulocytes 0.21 (*)    All other components within normal limits  C-REACTIVE PROTEIN - Abnormal; Notable for the following components:   CRP 3.7 (*)    All other components within normal limits  CULTURE, BLOOD (ROUTINE X 2)  CULTURE, BLOOD (ROUTINE X 2)  BRAIN NATRIURETIC PEPTIDE  LACTIC ACID, PLASMA  MAGNESIUM  PROCALCITONIN  CBC  POC SARS CORONAVIRUS 2 AG -  ED  TROPONIN I (HIGH SENSITIVITY)  TROPONIN I (HIGH SENSITIVITY)    EKG EKG Interpretation  Date/Time:  Thursday July 13 2019 18:52:42 EST Ventricular Rate:  89 PR Interval:  166 QRS Duration: 86 QT Interval:  356 QTC Calculation: 433 R Axis:   -42 Text Interpretation: Sinus rhythm with frequent  Premature ventricular complexes Left axis deviation Minimal voltage criteria for LVH, may  be normal variant ( R in aVL ) Possible Anterior infarct , age undetermined Abnormal ECG No significant change was found Confirmed by Glynn Octave [phone]) on 07/13/2019 8:12:52 PM   Radiology Ct Angio Chest Pe W And/or Wo Contrast  Result Date: 07/13/2019 CLINICAL DATA:  85 year old male with shortness of breath. On home oxygen. Status post COVID-19 in September. EXAM: CT ANGIOGRAPHY CHEST WITH CONTRAST TECHNIQUE: Multidetector CT imaging of the chest was performed using the standard protocol during bolus administration of intravenous contrast. Multiplanar CT image reconstructions and MIPs were obtained to evaluate the vascular anatomy. CONTRAST:  OMNIPAQUE IOHEXOL 350 MG/ML SOLN COMPARISON:  CTA chest 06/09/2019 and earlier. FINDINGS: Cardiovascular: Good contrast bolus timing in the pulmonary arterial tree. Mild respiratory motion. No focal filling defect identified in the pulmonary arteries to suggest acute pulmonary embolism. Calcified coronary artery atherosclerosis (series 7, image 135). Cardiac size at the upper limits of normal. No pericardial effusion. Negative visible aorta aside from atherosclerosis. Mediastinum/Nodes: Negative. No lymphadenopathy. Small gastric hiatal hernia. Lungs/Pleura: Lower lung volumes compared to October. Persistent confluent abnormal upper lobe pulmonary opacity, now ground-glass density rather than the consolidation seen in October, but in an identical configuration otherwise. Similar but less pronounced bilateral lower lobe and lingula involvement. There is some superimposed bronchiectasis. Additional scattered bilateral areas of ground-glass density in October have faded but not resolved. The major airways remain patent. No new areas of pulmonary opacity. No pleural effusions. Upper Abdomen: Surgically absent gallbladder. Stable and negative visible liver, spleen, pancreas, adrenal glands, kidneys, and bowel in the upper abdomen (small gastric hiatal hernia).  Musculoskeletal: No acute osseous abnormality identified. Review of the MIP images confirms the above findings. IMPRESSION: 1. Negative for acute pulmonary embolus. 2. Since October only slight improvement in the widespread multifocal pneumonia thought due to COVID-19; the confluent areas of consolidation previously have evolved to ground-glass opacity now. 3. No new abnormality in the chest. 4. Calcified coronary artery and Aortic Atherosclerosis (ICD10-I70.0). 5. Small gastric hiatal hernia. Electronically Signed   By: Odessa Fleming M.D.   On: 07/13/2019 21:26   Dg Chest Portable 1 View  Result Date: 07/13/2019 CLINICAL DATA:  Shortness of breath EXAM: PORTABLE CHEST 1 VIEW COMPARISON:  CT 06/09/2019 FINDINGS: Persistent patchy bilateral areas of airspace disease most pronounced in the left mid to lower lung and the right mid lung. No pneumothorax. Partial obscuration of left hemidiaphragm may reflect trace pleural fluid. Mild tortuosity of the tracheal air column is similar to priors. The cardiomediastinal contours are unremarkable. No acute osseous or soft tissue abnormality. Degenerative changes are present in the imaged spine and shoulders. Cholecystectomy clips in the right upper quadrant. Mask wire at the base of the neck. IMPRESSION: Persistent patchy bilateral areas of airspace disease, most pronounced in the left mid to lower lung and the right mid lung, could reflect residual infection or post infectious/inflammatory change. Electronically Signed   By: Kreg Shropshire M.D.   On: 07/13/2019 19:56    Procedures Procedures (including critical care time)  Medications Ordered in ED Medications  ALPRAZolam (XANAX) tablet 0.25 mg (has no administration in time range)  pantoprazole (PROTONIX) EC tablet 40 mg (40 mg Oral Given 07/14/19 0916)  vitamin C (ASCORBIC ACID) tablet 1,000 mg (1,000 mg Oral Given 07/14/19 0917)  cholecalciferol (VITAMIN D3) tablet 5,000 Units (5,000 Units Oral Given 07/14/19 0916)    albuterol (PROVENTIL) (2.5 MG/3ML) 0.083% nebulizer solution  2.5 mg (has no administration in time range)  fluticasone (FLONASE) 50 MCG/ACT nasal spray 2 spray (2 sprays Each Nare Given 07/14/19 0915)  guaiFENesin-dextromethorphan (ROBITUSSIN DM) 100-10 MG/5ML syrup 10 mL (has no administration in time range)  acetaminophen (TYLENOL) tablet 650 mg (has no administration in time range)    Or  acetaminophen (TYLENOL) suppository 650 mg (has no administration in time range)  ondansetron (ZOFRAN) tablet 4 mg (has no administration in time range)    Or  ondansetron (ZOFRAN) injection 4 mg (has no administration in time range)  enoxaparin (LOVENOX) injection 40 mg (40 mg Subcutaneous Given 07/14/19 0920)  hydrALAZINE (APRESOLINE) injection 10 mg (has no administration in time range)  doxycycline (VIBRAMYCIN) 100 mg in sodium chloride 0.9 % 250 mL IVPB (0 mg Intravenous Stopped 07/14/19 1134)  methylPREDNISolone sodium succinate (SOLU-MEDROL) 40 mg/mL injection 40 mg (40 mg Intravenous Given 07/14/19 0807)  albuterol (VENTOLIN HFA) 108 (90 Base) MCG/ACT inhaler 6 puff (6 puffs Inhalation Given 07/13/19 2149)  iohexol (OMNIPAQUE) 350 MG/ML injection 100 mL (100 mLs Intravenous Contrast Given 07/13/19 2033)  dexamethasone (DECADRON) injection 10 mg (10 mg Intravenous Given 07/13/19 2144)  magnesium sulfate IVPB 2 g 50 mL (0 g Intravenous Stopped 07/14/19 0209)     Initial Impression / Assessment and Plan / ED Course  I have reviewed the triage vital signs and the nursing notes.  Pertinent labs & imaging results that were available during my care of the patient were reviewed by me and considered in my medical decision making (see chart for details).    On arrival, patient is afebrile, satting in the mid 90s on 6 L nasal cannula, mildly tachypneic.  Patient does have to talk in short phrases but is otherwise not in respiratory distress when at rest.  Considered: Disease progression of known recent  COVID-19 infection, superimposed bacterial pneumonia, new onset CHF, PE, asthma exacerbation  Patient does have mild wheezing superimposed on diffuse rhonchi.  He was given 6 puffs albuterol treatment.  Patient has a leukocytosis of 17.8 (previously 8.5) however has recently been on outpatient prednisone therapy.   EKG: NSR with PVCs CXR: persistent patchy opacities throughout   CT PE study: No PE; slight improvement of multifocal PNA when compared to Oct.   Upon reassessment, pt continues to be tachypneic. Some improvement of wheezing with albuterol. Upon standing on home 3L O2, patient desaturates to 81% with RR in the 40s. Given his 2 days of worsening symptoms after initial improvement from discharge, Hospitalist contacted for admission and have assumed care. No further acute events. Pt agreeable with plan for admission.   Final Clinical Impressions(s) / ED Diagnoses   Final diagnoses:  Acute respiratory failure with hypoxia [facility])    ED Discharge Orders    None       Norton Pastel, MD 07/14/19 1237    Glynn Octave, MD 07/14/19 1313

## 2019-07-13 NOTE — ED Triage Notes (Signed)
Pt here for ongoing shob. Pt had covid in September, has been cleared. Pt shob at rest since yesterday. Pt wears 3L O2 at home. On 6L on arrival to ED, sats 94%.

## 2019-07-14 ENCOUNTER — Encounter (HOSPITAL_COMMUNITY): Payer: Self-pay | Admitting: Internal Medicine

## 2019-07-14 DIAGNOSIS — I10 Essential (primary) hypertension: Secondary | ICD-10-CM

## 2019-07-14 DIAGNOSIS — J9601 Acute respiratory failure with hypoxia: Secondary | ICD-10-CM

## 2019-07-14 LAB — COMPREHENSIVE METABOLIC PANEL
ALT: 38 U/L (ref 0–44)
AST: 31 U/L (ref 15–41)
Albumin: 2.6 g/dL — ABNORMAL LOW (ref 3.5–5.0)
Alkaline Phosphatase: 60 U/L (ref 38–126)
Anion gap: 9 (ref 5–15)
BUN: 20 mg/dL (ref 8–23)
CO2: 24 mmol/L (ref 22–32)
Calcium: 8.1 mg/dL — ABNORMAL LOW (ref 8.9–10.3)
Chloride: 102 mmol/L (ref 98–111)
Creatinine, Ser: 0.93 mg/dL (ref 0.61–1.24)
GFR calc Af Amer: >60 mL/min (ref >60)
GFR calc non Af Amer: >60 mL/min (ref >60)
Glucose, Bld: 158 mg/dL — ABNORMAL HIGH (ref 70–99)
Potassium: 4.8 mmol/L (ref 3.5–5.1)
Sodium: 135 mmol/L (ref 135–145)
Total Bilirubin: 0.8 mg/dL (ref 0.3–1.2)
Total Protein: 5.9 g/dL — ABNORMAL LOW (ref 6.5–8.1)

## 2019-07-14 LAB — CBC WITH DIFFERENTIAL/PLATELET
Abs Immature Granulocytes: 0.21 10*3/uL — ABNORMAL HIGH (ref 0.00–0.07)
Basophils Absolute: 0 10*3/uL (ref 0.0–0.1)
Basophils Relative: 0 %
Eosinophils Absolute: 0.1 10*3/uL (ref 0.0–0.5)
Eosinophils Relative: 0 %
HCT: 41.4 % (ref 39.0–52.0)
Hemoglobin: 13.1 g/dL (ref 13.0–17.0)
Immature Granulocytes: 2 %
Lymphocytes Relative: 5 %
Lymphs Abs: 0.6 10*3/uL — ABNORMAL LOW (ref 0.7–4.0)
MCH: 28.7 pg (ref 26.0–34.0)
MCHC: 31.6 g/dL (ref 30.0–36.0)
MCV: 90.8 fL (ref 80.0–100.0)
Monocytes Absolute: 0.1 10*3/uL (ref 0.1–1.0)
Monocytes Relative: 1 %
Neutro Abs: 10.7 10*3/uL — ABNORMAL HIGH (ref 1.7–7.7)
Neutrophils Relative %: 92 %
Platelets: 159 10*3/uL (ref 150–400)
RBC: 4.56 MIL/uL (ref 4.22–5.81)
RDW: 14.5 % (ref 11.5–15.5)
WBC: 11.7 10*3/uL — ABNORMAL HIGH (ref 4.0–10.5)
nRBC: 0 % (ref 0.0–0.2)

## 2019-07-14 LAB — SARS CORONAVIRUS 2 (TAT 6-24 HRS): SARS Coronavirus 2: POSITIVE — AB

## 2019-07-14 LAB — MAGNESIUM: Magnesium: 2.2 mg/dL (ref 1.7–2.4)

## 2019-07-14 LAB — TROPONIN I (HIGH SENSITIVITY)
Troponin I (High Sensitivity): 5 ng/L (ref <18)
Troponin I (High Sensitivity): 4 ng/L (ref <18)

## 2019-07-14 LAB — C-REACTIVE PROTEIN: CRP: 3.7 mg/dL — ABNORMAL HIGH (ref <1.0)

## 2019-07-14 LAB — BRAIN NATRIURETIC PEPTIDE: B Natriuretic Peptide: 23.2 pg/mL (ref 0.0–100.0)

## 2019-07-14 LAB — PROCALCITONIN: Procalcitonin: <0.1 ng/mL

## 2019-07-14 LAB — LACTIC ACID, PLASMA: Lactic Acid, Venous: 1 mmol/L (ref 0.5–1.9)

## 2019-07-14 MED ORDER — ENOXAPARIN SODIUM 40 MG/0.4ML ~~LOC~~ SOLN
40.0000 mg | Freq: Every day | SUBCUTANEOUS | Status: DC
  Administered 2019-07-14: 09:00:00 40 mg via SUBCUTANEOUS
  Filled 2019-07-14: qty 0.4

## 2019-07-14 MED ORDER — PANTOPRAZOLE SODIUM 40 MG PO TBEC
40.0000 mg | DELAYED_RELEASE_TABLET | Freq: Every day | ORAL | Status: DC
  Administered 2019-07-14: 40 mg via ORAL
  Filled 2019-07-14: qty 1

## 2019-07-14 MED ORDER — DEXAMETHASONE SODIUM PHOSPHATE 10 MG/ML IJ SOLN: 6.0000 mg | INTRAMUSCULAR | Status: DC

## 2019-07-14 MED ORDER — ACETAMINOPHEN 325 MG PO TABS: 650.0000 mg | ORAL_TABLET | Freq: Four times a day (QID) | ORAL | Status: DC | PRN

## 2019-07-14 MED ORDER — ALPRAZOLAM 0.25 MG PO TABS: 0.2500 mg | ORAL_TABLET | Freq: Two times a day (BID) | ORAL | Status: DC | PRN

## 2019-07-14 MED ORDER — GUAIFENESIN-DM 100-10 MG/5ML PO SYRP: 10.0000 mL | ORAL_SOLUTION | Freq: Four times a day (QID) | ORAL | Status: DC | PRN

## 2019-07-14 MED ORDER — ALBUTEROL SULFATE HFA 108 (90 BASE) MCG/ACT IN AERS: 2.0000 | INHALATION_SPRAY | RESPIRATORY_TRACT | Status: DC | PRN

## 2019-07-14 MED ORDER — ONDANSETRON HCL 4 MG/2ML IJ SOLN: 4.0000 mg | Freq: Four times a day (QID) | INTRAMUSCULAR | Status: DC | PRN

## 2019-07-14 MED ORDER — ALPRAZOLAM 0.25 MG PO TABS: 0.2500 mg | ORAL_TABLET | Freq: Every day | ORAL | Status: DC

## 2019-07-14 MED ORDER — VALSARTAN-HYDROCHLOROTHIAZIDE 320-25 MG PO TABS: 0.5000 | ORAL_TABLET | Freq: Every day | ORAL | Status: DC

## 2019-07-14 MED ORDER — FLUTICASONE PROPIONATE 50 MCG/ACT NA SUSP
2.0000 | Freq: Every day | NASAL | Status: DC
  Administered 2019-07-14: 2 via NASAL
  Filled 2019-07-14: qty 16

## 2019-07-14 MED ORDER — ACETAMINOPHEN 650 MG RE SUPP: 650.0000 mg | Freq: Four times a day (QID) | RECTAL | Status: DC | PRN

## 2019-07-14 MED ORDER — ALBUTEROL SULFATE (2.5 MG/3ML) 0.083% IN NEBU: 2.5000 mg | INHALATION_SOLUTION | RESPIRATORY_TRACT | Status: DC | PRN

## 2019-07-14 MED ORDER — ONDANSETRON HCL 4 MG PO TABS: 4.0000 mg | ORAL_TABLET | Freq: Four times a day (QID) | ORAL | Status: DC | PRN

## 2019-07-14 MED ORDER — SODIUM CHLORIDE 0.9 % IV SOLN
100.0000 mg | Freq: Two times a day (BID) | INTRAVENOUS | Status: DC
  Administered 2019-07-14: 08:00:00 100 mg via INTRAVENOUS
  Filled 2019-07-14 (×3): qty 100

## 2019-07-14 MED ORDER — VITAMIN C 500 MG PO TABS
1000.0000 mg | ORAL_TABLET | Freq: Every morning | ORAL | Status: DC
  Administered 2019-07-14: 09:00:00 1000 mg via ORAL
  Filled 2019-07-14: qty 2

## 2019-07-14 MED ORDER — VITAMIN D 25 MCG (1000 UNIT) PO TABS
5000.0000 [IU] | ORAL_TABLET | Freq: Every morning | ORAL | Status: DC
  Administered 2019-07-14: 5000 [IU] via ORAL
  Filled 2019-07-14: qty 5

## 2019-07-14 MED ORDER — HYDRALAZINE HCL 20 MG/ML IJ SOLN: 10.0000 mg | INTRAMUSCULAR | Status: DC | PRN

## 2019-07-14 MED ORDER — METHYLPREDNISOLONE SODIUM SUCC 40 MG IJ SOLR
40.0000 mg | Freq: Two times a day (BID) | INTRAMUSCULAR | Status: DC
  Administered 2019-07-14: 08:00:00 40 mg via INTRAVENOUS
  Filled 2019-07-14: qty 1

## 2019-07-14 MED ORDER — DEXAMETHASONE 1 MG PO TABS: ORAL_TABLET | ORAL | 0 refills | Status: DC

## 2019-07-14 NOTE — ED Notes (Signed)
Heart healthy dinner tray ordered 

## 2019-07-14 NOTE — ED Notes (Signed)
Lunch Tray Order @ 1012.  

## 2019-07-14 NOTE — Care Management Obs Status (Signed)
[facility] NOTIFICATION   Patient Details  Name: Alejandro Hicks MRN: [id number] Date of Birth: [birth year]   Medicare Observation Status Notification Given:       Laurena Slimmer, RN 07/14/2019, 4:43 PM

## 2019-07-14 NOTE — Consult Note (Signed)
NAME:  Alejandro Hicks, MRN:  [id number], DOB:  [birth year], LOS: 1 ADMISSION DATE:  07/13/2019, CONSULTATION DATE:  07/14/2019 REFERRING MD:  Waymon Amato, CHIEF COMPLAINT: Shortness of breath  Brief History   85 year old gentleman who presented to the hospital with shortness of breath Recent diagnosis of COVID-19 pneumonia treated with remdesivir, steroids-10 /3-10/13 Treated for pneumonia 10/16-10/29-completed course of antibiotics  History of present illness   Progressive shortness of breath Has never really bounced back completely from recent infections Debilitated  Past Medical History   Past Medical History:  Diagnosis Date  . Allergic rhinitis   . Anxiety   . Asthma   . BPH (benign prostatic hypertrophy)   . Colonic polyp   . Dyslipidemia   . ED (erectile dysfunction)   . GERD (gastroesophageal reflux disease)   . Inguinal hernia    Left S/P repair in 2012  . Leukoplakia of vocal cords   . OA (osteoarthritis)   . OSA (obstructive sleep apnea)    Does not use CPAP  . Reflux laryngitis    ENT ecal, PPIs twice a day  . Rosacea      Significant Hospital Events     Consults:  Pulmonary  Procedures:  none  Significant Diagnostic Tests:  CT scan of the chest IMPRESSION: 1. Negative for acute pulmonary embolus. 2. Since October only slight improvement in the widespread multifocal pneumonia thought due to COVID-19; the confluent areas of consolidation previously have evolved to ground-glass opacity now. 3. No new abnormality in the chest. 4. Calcified coronary artery and Aortic Atherosclerosis (ICD10-I70.0). 5. Small gastric hiatal hernia. Micro Data:  Blood cultures 11/19  Antimicrobials:  Doxycycline 11/20  Interim history/subjective:  Stable status  Objective   Blood pressure 108/76, pulse 86, temperature 98.3 F (36.8 C), temperature source Oral, resp. rate (!) 28, SpO2 93 %.       No intake or output data in the 24 hours ending 07/14/19 1429  There were no vitals filed for this visit.  Examination: General: Elderly, frail, not in distress HENT: Moist oral mucosa Lungs: Coarse breath sounds, on nasal cannula Cardiovascular: S1-S2 appreciated Abdomen: Soft, bowel sounds Extremities: No clubbing, no edema Neuro: Alert and oriented x3 nonfocal findings  Resolved Hospital Problem list     Assessment & Plan:  Recent Covid pneumonia Recent treatment for community-acquired pneumonia Shortness of breath with activity  deconditioning is probably playing a significant role as patient has not had the opportunity to recover from recent debilitating conditions CT scan findings will probably take a while before full resolution He does not have any significant symptoms suggesting a new infection at present  Hypertension -Continue home medications  Asthma -Continue steroids -Bronchodilators  GERD -Continue PPI  Leukocytosis is mild -Likely related to recent steroids  I do believe a significant part of the ongoing process is just general debility He will benefit from graded exercises at home Continue oxygen supplementation We will continue steroids -Downside to steroids will be associated muscle weakness which will generally affect the proximal muscles which may be manifesting currently  Wean down oxygen as tolerated Trying to keep oxygen above 88-90 Temporary/transient drops with activity will be present  Tapering dose of steroids-May use Decadron starting at 5 mg daily, cut down by 1 mg every 3 to 4 days until discontinued Encouraged to exercise regularly to try and maintain much muscle strength  I do not believe he needs to stay in the hospital Above was discussed with the patient's daughter who feels  comfortable taking him home I did recommend tapering steroids, oxygen which they can titrate as needed   Best practice:  Diet: As per primary Pain/Anxiety/Delirium protocol (if indicated): No indication at present  DVT prophylaxis: Lovenox GI prophylaxis: PPI Mobility: As tolerated Code Status: Full code Family Communication: Spoke with daughter [name] on the phone Disposition:    Review of Systems:   Weakness, easy fatigability Shortness of breath on exertion  Past Medical History  He,  has a past medical history of Allergic rhinitis, Anxiety, Asthma, BPH (benign prostatic hypertrophy), Colonic polyp, Dyslipidemia, ED (erectile dysfunction), GERD (gastroesophageal reflux disease), Inguinal hernia, Leukoplakia of vocal cords, OA (osteoarthritis), OSA (obstructive sleep apnea), Reflux laryngitis, and Rosacea.   Surgical History    Past Surgical History:  Procedure Laterality Date  . COLONOSCOPY  2009  . INGUINAL HERNIA REPAIR Left    2012     Social History   reports that he has quit smoking. He has never used smokeless tobacco. He reports current alcohol use. He reports that he does not use drugs.   Family History   His family history is not on file.   Allergies Allergies  Allergen Reactions  . Lipitor [Atorvastatin] Other (See Comments)    Myalgia  . Zocor [Simvastatin] Other (See Comments)    Myalgia     Sherrilyn Rist, MD [place], PCCM

## 2019-07-14 NOTE — ED Notes (Signed)
Pt discharged w/ code 81. Case management, Mariann Laster, stated pt was good to go home. Pt transported via wheelchair and was placed on home O2 before getting into daughter's vehicle. Pt verbalized understanding of discharge instructions and follow-up care.

## 2019-07-14 NOTE — ED Notes (Signed)
Contacted pharmacy again for missing dose of doxycycline. Spoke on the phone and was directed to send another chat request.

## 2019-07-14 NOTE — Care Management CC44 (Signed)
Condition Code 44 Documentation Completed  Patient Details  Name: Alejandro Hicks MRN: [id number] Date of Birth: May 21, 1934   Condition Code 44 given:    Patient signature on Condition Code 44 notice:    Documentation of 2 MD's agreement:    Code 44 added to claim:       Laurena Slimmer, RN 07/14/2019, 4:43 PM

## 2019-07-14 NOTE — Discharge Summary (Addendum)
Physician Discharge Summary  Alejandro Hicks FAO:[id number] DOB: 1934-03-08  PCP: Georgann Housekeeper, MD  Admitted from: Home Discharged to: Home  Admit date: 07/13/2019 Discharge date: 07/14/2019  Recommendations for Outpatient Follow-up:   Follow-up Information    Georgann Housekeeper, MD. Schedule an appointment as soon as possible for a visit in 1 week(s).   Specialty: Internal Medicine Why: To be seen with repeat labs (CBC & BMP). Contact information: [address] [place] [address] [phone]            Home Health: Resume prior home health services, PT and OT Equipment/Devices: Has prior home oxygen.  Discharge Condition: Improved and stable CODE STATUS: Full Diet recommendation: Heart healthy diet  Discharge Diagnoses:  Principal Problem:   Acute respiratory failure with hypoxia (HCC) Active Problems:   Essential hypertension, benign   Brief Summary: 85 year old male, PMH of asthma, HLD, GERD, OSA not on CPAP, HTN, anxiety, tobacco abuse, recent 2 hospitalizations 10/3-10/13 for Covid 19 pneumonia treated with remdesivir and systemic steroids, SNF recommended but he declined and went home, readmitted 10/16-10/29 for acute on chronic hypoxic respiratory failure, treated for suspected pneumonia complicating recent COVID-19 pneumonia, with ceftriaxone and doxycycline, discharged to SNF on tapering steroids, presented to ED on 07/13/2019 due to progressive dyspnea on minimal exertion without chest pain or cough.  Admitted for acute on chronic hypoxic respiratory failure secondary to sequelae from recent COVID-19 pneumonia.  Decadron was switched to Solu-Medrol, doxycycline was continued, pulmonology was consulted for assistance.   Assessment & Plan:   Principal Problem:   Acute respiratory failure with hypoxia (HCC) Active Problems:   Essential hypertension, benign   1. Acute on chronic hypoxic respiratory failure secondary to COVID-19 lung  sequelae: CTA chest negative for acute pulmonary embolism.  Slight improvement in widespread multifocal pneumonia thought to be due to COVID-19, groundglass opacity.  BNP, troponin, lactate, procalcitonin normal.  CRP mildly elevated/3.7.  COVID-19 testing positive again.  Currently on IV Solu-Medrol and doxycycline.  Some of his presentation may be related to profound debility from recent acute illness/hospitalization and advanced age.  Not sure there is much that can be done other than supportive care.  Consulted pulmonology for assistance.  Was on 3 L/min home oxygen PTA, presented with hypoxia requiring 5 L/min.  Subjectively better. Pulmonology consultation appreciated.  I discussed in detail with Dr. Wynona Neat.  He feels that deconditioning is probably playing a significant role as patient has not had the opportunity to recover from recent debilitating conditions.  CT findings probably will take a while before full resolution.  He does not have any significant symptoms suggesting a new infection at present.  He recommends graded exercises at home, continue home oxygen supplementation, initiation of Decadron at 5 mg daily and gradual taper by 1 mg every 3 to 4 days (symptoms appears to have worsened after completion of recent steroid taper).  He does not believe that patient warrants continued stay in the hospital and he has discussed this with the patient and daughter who are extremely happy to return home.  They have a pulse oximetry at home and they are aware how to titrate oxygen to saturations between 88-90%.  Both feel comfortable going home.  Thereby patient was discharged home. 2. Essential hypertension:  Continue prior home ARB. 3. Asthma: No clinical exacerbation.  Continue steroids and inhalers. 4. GERD: Continue PPI. 5. Leukocytosis: Mild and likely related to steroids. 6. Deconditioning/debility: Resume home health PT 7. Malnutrition: Nutritional supplements at  home   Addendum At time of  discharge yesterday, I had forgotten to update patient regarding isolation precautions.  However this is his third admission for Covid related illness since early October.  I called and discussed in detail with patient's daughter this morning.  It has been approximately 3 weeks since previous hospital discharge.  Although he was readmitted briefly this time and Covid testing was positive, less likely to be infectious and his presentation was felt to be more due to the sequelae of Covid related lung inflammation.  Having said that, advised to follow commonsense self-isolation precautions, using mask and avoid unnecessary contacts. She verbalized understanding and was appreciative of the call.   Consultants:  Pulmonology  Procedures:  None   Discharge Instructions  Discharge Instructions    Call MD for:  difficulty breathing, headache or visual disturbances   Complete by: As directed    Call MD for:  extreme fatigue   Complete by: As directed    Call MD for:  persistant dizziness or light-headedness   Complete by: As directed    Call MD for:  persistant nausea and vomiting   Complete by: As directed    Call MD for:  severe uncontrolled pain   Complete by: As directed    Call MD for:  temperature >100.4   Complete by: As directed    Diet - low sodium heart healthy   Complete by: As directed    Increase activity slowly   Complete by: As directed        Medication List    STOP taking these medications   nystatin 100000 UNIT/ML suspension Commonly known as: MYCOSTATIN   predniSONE 10 MG (21) Tbpk tablet Commonly known as: STERAPRED UNI-PAK 21 TAB     TAKE these medications   acetaminophen 500 MG tablet Commonly known as: TYLENOL Take 1,000 mg by mouth every 6 (six) hours as needed for headache (pain).   AeroChamber Plus Flo-Vu Large Misc 1 each by Other route once.   albuterol 108 (90 Base) MCG/ACT inhaler Commonly known as: ProAir HFA Use 2 puffs every 4 hours as needed  for cough and wheeze. May use 10-20 minutes before exercise What changed:   how much to take  how to take this  when to take this  reasons to take this  additional instructions   ALPRAZolam 0.25 MG tablet Commonly known as: XANAX Take 1 tablet (0.25 mg total) by mouth at bedtime.   anti-nausea solution Take 30 mLs by mouth every 15 (fifteen) minutes as needed for nausea or vomiting.   Delsym 30 MG/5ML liquid Generic drug: dextromethorphan Take 60 mg by mouth 2 (two) times daily as needed for cough.   dexamethasone 1 MG tablet Commonly known as: Decadron Take 5 tabs (5 mg total) daily for 4 days, then 4 tabs (4 mg total) daily for 4 days, then 3 tabs (3 mg total) daily for 4 days, then 2 tabs (2 mg total) daily for 4 days, then 1 tab (1 mg total) daily for 4 days, then stop.   Dexilant 60 MG capsule Generic drug: dexlansoprazole TAKE 1 CAPSULE (60 MG TOTAL) BY MOUTH EVERY MORNING. What changed: See the new instructions.   fluticasone 50 MCG/ACT nasal spray Commonly known as: FLONASE Place 1 spray into both nostrils daily.   guaiFENesin-dextromethorphan 100-10 MG/5ML syrup Commonly known as: ROBITUSSIN DM Take 10 mLs by mouth every 6 (six) hours as needed for cough.   ibuprofen 200 MG tablet Commonly known as:  ADVIL Take 400 mg by mouth every 6 (six) hours as needed for headache (pain).   OXYGEN Inhale 3 L/min into the lungs See admin instructions. Continuous - 3L at rest/ 6 L with exertion   testosterone cypionate 200 MG/ML injection Commonly known as: DEPOTESTOSTERONE CYPIONATE Inject 60 mg into the muscle once a week. 0.3 ml - 60 mg (inject into lateral thigh or buttock)   valsartan-hydrochlorothiazide 320-25 MG tablet Commonly known as: DIOVAN-HCT Take 0.5 tablets by mouth at bedtime.   vitamin C 1000 MG tablet Take 1,000 mg by mouth 2 (two) times daily.   Vitamin D-3 125 MCG (5000 UT) Tabs Take 5,000 Units by mouth every morning.   VITAMIN K2 PO Take  1 tablet by mouth every morning.   ZINC PO Take 1 tablet by mouth every other day.      Allergies  Allergen Reactions  . Lipitor [Atorvastatin] Other (See Comments)    Myalgia  . Zocor [Simvastatin] Other (See Comments)    Myalgia      Procedures/Studies: Ct Angio Chest Pe W And/or Wo Contrast  Result Date: 07/13/2019 CLINICAL DATA:  85 year old male with shortness of breath. On home oxygen. Status post COVID-19 in September. EXAM: CT ANGIOGRAPHY CHEST WITH CONTRAST TECHNIQUE: Multidetector CT imaging of the chest was performed using the standard protocol during bolus administration of intravenous contrast. Multiplanar CT image reconstructions and MIPs were obtained to evaluate the vascular anatomy. CONTRAST:  159mL OMNIPAQUE IOHEXOL 350 MG/ML SOLN COMPARISON:  CTA chest 06/09/2019 and earlier. FINDINGS: Cardiovascular: Good contrast bolus timing in the pulmonary arterial tree. Mild respiratory motion. No focal filling defect identified in the pulmonary arteries to suggest acute pulmonary embolism. Calcified coronary artery atherosclerosis (series 7, image 135). Cardiac size at the upper limits of normal. No pericardial effusion. Negative visible aorta aside from atherosclerosis. Mediastinum/Nodes: Negative. No lymphadenopathy. Small gastric hiatal hernia. Lungs/Pleura: Lower lung volumes compared to October. Persistent confluent abnormal upper lobe pulmonary opacity, now ground-glass density rather than the consolidation seen in October, but in an identical configuration otherwise. Similar but less pronounced bilateral lower lobe and lingula involvement. There is some superimposed bronchiectasis. Additional scattered bilateral areas of ground-glass density in October have faded but not resolved. The major airways remain patent. No new areas of pulmonary opacity. No pleural effusions. Upper Abdomen: Surgically absent gallbladder. Stable and negative visible liver, spleen, pancreas, adrenal  glands, kidneys, and bowel in the upper abdomen (small gastric hiatal hernia). Musculoskeletal: No acute osseous abnormality identified. Review of the MIP images confirms the above findings. IMPRESSION: 1. Negative for acute pulmonary embolus. 2. Since October only slight improvement in the widespread multifocal pneumonia thought due to COVID-19; the confluent areas of consolidation previously have evolved to ground-glass opacity now. 3. No new abnormality in the chest. 4. Calcified coronary artery and Aortic Atherosclerosis (ICD10-I70.0). 5. Small gastric hiatal hernia. Electronically Signed   By: Genevie Ann M.D.   On: 07/13/2019 21:26   Dg Chest Portable 1 View  Result Date: 07/13/2019 CLINICAL DATA:  Shortness of breath EXAM: PORTABLE CHEST 1 VIEW COMPARISON:  CT 06/09/2019 FINDINGS: Persistent patchy bilateral areas of airspace disease most pronounced in the left mid to lower lung and the right mid lung. No pneumothorax. Partial obscuration of left hemidiaphragm may reflect trace pleural fluid. Mild tortuosity of the tracheal air column is similar to priors. The cardiomediastinal contours are unremarkable. No acute osseous or soft tissue abnormality. Degenerative changes are present in the imaged spine and shoulders. Cholecystectomy clips in  the right upper quadrant. Mask wire at the base of the neck. IMPRESSION: Persistent patchy bilateral areas of airspace disease, most pronounced in the left mid to lower lung and the right mid lung, could reflect residual infection or post infectious/inflammatory change. Electronically Signed   By: Kreg Shropshire M.D.   On: 07/13/2019 19:56      Subjective: Patient seen this morning in the ED.  Reports that he could barely get up from his bed or chair for a couple of steps and would feel extremely dyspneic.  Feels much better since admission to the hospital.  Dyspnea improved.  No chest pain.  Minimal intermittent dry cough.   Discharge Exam:  Vitals:   07/14/19  1245 07/14/19 1330 07/14/19 1415 07/14/19 1430  BP: 111/72 124/65 108/76 (!) 118/56  Pulse: 81 78 86 86  Resp: (!) 34 (!) 25 (!) 28 (!) 27  Temp:      TempSrc:      SpO2: 98% 92% 93% 95%    General exam: Pleasant elderly male, moderately built, frail and chronically ill looking lying comfortably propped up in bed without distress Respiratory system: Slightly harsh breath sounds bilaterally but no obvious wheezing, rhonchi or crackles.  No increased work of breathing.  Able to speak in full sentences.  Seen saturating in the mid 90s on 4 L/min nasal cannula oxygen. Cardiovascular system: S1 & S2 heard, RRR. No JVD, murmurs, rubs, gallops or clicks. No pedal edema. Gastrointestinal system: Abdomen is nondistended, soft and nontender. No organomegaly or masses felt. Normal bowel sounds heard. Central nervous system: Alert and oriented. No focal neurological deficits. Extremities: Symmetric 5 x 5 power. Skin: No rashes, lesions or ulcers Psychiatry: Judgement and insight appear normal. Mood & affect appropriate.    The results of significant diagnostics from this hospitalization (including imaging, microbiology, ancillary and laboratory) are listed below for reference.     Microbiology: Recent Results (from the past 240 hour(s))  SARS CORONAVIRUS 2 (TAT 6-24 HRS) Nasopharyngeal Nasopharyngeal Swab     Status: Abnormal   Collection Time: 07/13/19  9:48 PM   Specimen: Nasopharyngeal Swab  Result Value Ref Range Status   SARS Coronavirus 2 POSITIVE (A) NEGATIVE Final    Comment: RESULT CALLED TO, READ BACK BY AND VERIFIED WITH: ROBERTSON E, RN AT 0354 ON 07/14/2019 BY SAINVILUS S (NOTE) SARS-CoV-2 target nucleic acids are DETECTED. The SARS-CoV-2 RNA is generally detectable in upper and lower respiratory specimens during the acute phase of infection. Positive results are indicative of active infection with SARS-CoV-2. Clinical  correlation with patient history and other diagnostic  information is necessary to determine patient infection status. Positive results do  not rule out bacterial infection or co-infection with other viruses. The expected result is Negative. Fact Sheet for Patients: HairSlick.no Fact Sheet for Healthcare Providers: quierodirigir.com This test is not yet approved or cleared by the [place] FDA and  has been authorized for detection and/or diagnosis of SARS-CoV-2 by FDA under an Emergency Use Authorization (EUA). This EUA will remain  in effect (meaning this test c an be used) for the duration of the COVID-19 declaration under Section 564(b)(1) of the Act, 21 U.S.C. section 360bbb-3(b)(1), unless the authorization is terminated or revoked sooner. Performed at [facility], [address], [place], [place] [address]   Blood culture (routine x 2)     Status: None (Preliminary result)   Collection Time: 07/13/19 11:51 PM   Specimen: BLOOD  Result Value Ref Range Status  Specimen Description BLOOD RIGHT ANTECUBITAL  Final   Special Requests   Final    BOTTLES DRAWN AEROBIC AND ANAEROBIC Blood Culture results may not be optimal due to an inadequate volume of blood received in culture bottles   Culture   Final    NO GROWTH < 12 HOURS Performed at [facility], [address] [place], [place] [address]    Report Status PENDING  Incomplete  Blood culture (routine x 2)     Status: None (Preliminary result)   Collection Time: 07/13/19 11:51 PM   Specimen: BLOOD RIGHT FOREARM  Result Value Ref Range Status   Specimen Description BLOOD RIGHT FOREARM  Final   Special Requests   Final    BOTTLES DRAWN AEROBIC AND ANAEROBIC Blood Culture adequate volume   Culture   Final    NO GROWTH < 12 HOURS Performed at [facility], [address] [place], [place] [address]    Report Status PENDING  Incomplete     Labs: CBC: Recent Labs  Lab 07/13/19 1859 07/14/19 0420   WBC 17.8* 11.7*  NEUTROABS 13.1* 10.7*  HGB 16.7 13.1  HCT 50.9 41.4  MCV 88.8 90.8  PLT 212 159    Basic Metabolic Panel: Recent Labs  Lab 07/13/19 1859 07/13/19 2351 07/14/19 0420  NA 134*  --  135  K 4.4  --  4.8  CL 97*  --  102  CO2 27  --  24  GLUCOSE 104*  --  158*  BUN 22  --  20  CREATININE 1.06  --  0.93  CALCIUM 8.9  --  8.1*  MG  --  2.2  --     Liver Function Tests: Recent Labs  Lab 07/13/19 1859 07/14/19 0420  AST 28 31  ALT 46* 38  ALKPHOS 72 60  BILITOT 0.6 0.8  PROT 6.7 5.9*  ALBUMIN 3.1* 2.6*       Time coordinating discharge: 25 minutes  SIGNED:  Marcellus ScottAnand Khai Arrona, MD, FACP, [facility]. [facility]  To contact the attending provider between 7A-7P or the covering provider during after hours 7P-7A, please log into the web site [email or web address] and access using universal [place] password for that web site. If you do not have the password, please call the hospital operator.

## 2019-07-14 NOTE — Discharge Instructions (Signed)

## 2019-07-14 NOTE — ED Notes (Signed)
SDU  Breakfast ordered  

## 2019-07-14 NOTE — ED Notes (Addendum)
Case management explained to daughter that pt should go home via [facility] due to exhaustion and tachypnea with movement, daughter insistent that she will pick pt up.

## 2019-07-14 NOTE — ED Notes (Signed)
Pharmacy messaged for missing doxycycline dose.

## 2019-07-14 NOTE — Care Management (Signed)
ED CM spoke with patient's daughter  [name] concerning transitional care plan to resume [facility] services, she reports he is with [facility] will notify them.  Patient oxygen needs have increased patient and family instructed to tritrate up to 5-6 liters he has oxygen already at home. CM Offered to  Transport patient home by PT[facility] patient 's family insist on transporting patient home.  Updated RN on [place]. No further ED CM needs identified

## 2019-07-14 NOTE — Progress Notes (Signed)
PROGRESS NOTE   Alejandro Hicks  MOQ:[id number]    DOB: 1934-07-22    DOA: 07/13/2019  PCP: Georgann Housekeeper, MD   I have briefly reviewed patients previous medical records in [facility].  Chief Complaint  Patient presents with   Shortness of Breath    Brief Narrative:  85 year old male, PMH of asthma, HLD, GERD, OSA not on CPAP, HTN, anxiety, tobacco abuse, recent 2 hospitalizations 10/3-10/13 for Covid 19 pneumonia treated with remdesivir and systemic steroids, SNF recommended but he declined and went home, readmitted 10/16-10/29 for acute on chronic hypoxic respiratory failure, treated for suspected pneumonia complicating recent COVID-19 pneumonia, with ceftriaxone and doxycycline, discharged to SNF on tapering steroids, presented to ED on 07/13/2019 due to progressive dyspnea on minimal exertion without chest pain or cough.  Admitted for acute on chronic hypoxic respiratory failure secondary to sequelae from recent COVID-19 pneumonia.  Decadron was switched to Solu-Medrol, doxycycline was continued, pulmonology was consulted for assistance.   Assessment & Plan:   Principal Problem:   Acute respiratory failure with hypoxia (HCC) Active Problems:   Essential hypertension, benign   1. Acute on chronic hypoxic respiratory failure secondary to COVID-19 lung sequelae:?  ARDS/Boop/early ILD.  CTA chest negative for acute pulmonary embolism.  Slight improvement in widespread multifocal pneumonia thought to be due to COVID-19, groundglass opacity.  BNP, troponin, lactate, procalcitonin normal.  CRP mildly elevated/3.7.  COVID-19 testing positive again.  Currently on IV Solu-Medrol and doxycycline.  Some of his presentation may be related to profound debility from recent acute illness/hospitalization and advanced age.  Not sure there is much that can be done other than supportive care.  Consulted pulmonology for assistance.  Was on 3 L/min home oxygen PTA, presented with hypoxia requiring 5  L/min.  Subjectively better. 2. Essential hypertension: Controlled off of home ARB, continue to hold for now. 3. Asthma: No clinical exacerbation.  Continue steroids and inhalers. 4. GERD: Continue PPI. 5. Leukocytosis: Mild and likely related to steroids. 6. Deconditioning/debility: PT and OT evaluation. 7. Malnutrition: Dietitian consulted.   DVT prophylaxis: Lovenox Code Status: Full Family Communication: None at bedside Disposition: To be determined pending clinical improvement   Consultants:  Pulmonology-pending  Procedures:  None  Antimicrobials:  IV doxycycline   Subjective: Patient seen this morning in the ED.  Reports that he could barely get up from his bed or chair for a couple of steps and would feel extremely dyspneic.  Feels much better since admission to the hospital.  Dyspnea improved.  No chest pain.  Minimal intermittent dry cough.  Objective:  Vitals:   07/14/19 1200 07/14/19 1215 07/14/19 1230 07/14/19 1245  BP: 114/69 (!) 119/57 113/63 111/72  Pulse: 84 85 84 81  Resp: (!) 34 (!) 34 (!) 31 (!) 34  Temp:      TempSrc:      SpO2: 97% 98% 98% 98%    Examination:  General exam: Pleasant elderly male, moderately built, frail and chronically ill looking lying comfortably propped up in bed without distress Respiratory system: Slightly harsh breath sounds bilaterally but no obvious wheezing, rhonchi or crackles.  No increased work of breathing.  Able to speak in full sentences.  Seen saturating in the mid 90s on 4 L/min nasal cannula oxygen. Cardiovascular system: S1 & S2 heard, RRR. No JVD, murmurs, rubs, gallops or clicks. No pedal edema. Gastrointestinal system: Abdomen is nondistended, soft and nontender. No organomegaly or masses felt. Normal bowel sounds heard. Central nervous system: Alert and  oriented. No focal neurological deficits. Extremities: Symmetric 5 x 5 power. Skin: No rashes, lesions or ulcers Psychiatry: Judgement and insight appear  normal. Mood & affect appropriate.     Data Reviewed: I have personally reviewed following labs and imaging studies   CBC: Recent Labs  Lab 07/13/19 1859 07/14/19 0420  WBC 17.8* 11.7*  NEUTROABS 13.1* 10.7*  HGB 16.7 13.1  HCT 50.9 41.4  MCV 88.8 90.8  PLT 212 106    Basic Metabolic Panel: Recent Labs  Lab 07/13/19 1859 07/13/19 2351 07/14/19 0420  NA 134*  --  135  K 4.4  --  4.8  CL 97*  --  102  CO2 27  --  24  GLUCOSE 104*  --  158*  BUN 22  --  20  CREATININE 1.06  --  0.93  CALCIUM 8.9  --  8.1*  MG  --  2.2  --     Liver Function Tests: Recent Labs  Lab 07/13/19 1859 07/14/19 0420  AST 28 31  ALT 46* 38  ALKPHOS 72 60  BILITOT 0.6 0.8  PROT 6.7 5.9*  ALBUMIN 3.1* 2.6*    CBG: No results for input(s): GLUCAP in the last 168 hours.  Recent Results (from the past 240 hour(s))  SARS CORONAVIRUS 2 (TAT 6-24 HRS) Nasopharyngeal Nasopharyngeal Swab     Status: Abnormal   Collection Time: 07/13/19  9:48 PM   Specimen: Nasopharyngeal Swab  Result Value Ref Range Status   SARS Coronavirus 2 POSITIVE (A) NEGATIVE Final    Comment: RESULT CALLED TO, READ BACK BY AND VERIFIED WITH: ROBERTSON E, RN AT 0354 ON 07/14/2019 BY SAINVILUS S (NOTE) SARS-CoV-2 target nucleic acids are DETECTED. The SARS-CoV-2 RNA is generally detectable in upper and lower respiratory specimens during the acute phase of infection. Positive results are indicative of active infection with SARS-CoV-2. Clinical  correlation with patient history and other diagnostic information is necessary to determine patient infection status. Positive results do  not rule out bacterial infection or co-infection with other viruses. The expected result is Negative. Fact Sheet for Patients: SugarRoll.be Fact Sheet for Healthcare Providers: https://www.woods-mathews.com/ This test is not yet approved or cleared by the [place] FDA and  has been  authorized for detection and/or diagnosis of SARS-CoV-2 by FDA under an Emergency Use Authorization (EUA). This EUA will remain  in effect (meaning this test c an be used) for the duration of the COVID-19 declaration under Section 564(b)(1) of the Act, 21 U.S.C. section 360bbb-3(b)(1), unless the authorization is terminated or revoked sooner. Performed at [facility], [place] [address] [place], [place] [address]   Blood culture (routine x 2)     Status: None (Preliminary result)   Collection Time: 07/13/19 11:51 PM   Specimen: BLOOD  Result Value Ref Range Status   Specimen Description BLOOD RIGHT ANTECUBITAL  Final   Special Requests   Final    BOTTLES DRAWN AEROBIC AND ANAEROBIC Blood Culture results may not be optimal due to an inadequate volume of blood received in culture bottles   Culture   Final    NO GROWTH < 12 HOURS Performed at [facility], [place] [address] [place], [place] [address]    Report Status PENDING  Incomplete  Blood culture (routine x 2)     Status: None (Preliminary result)   Collection Time: 07/13/19 11:51 PM   Specimen: BLOOD RIGHT FOREARM  Result Value Ref Range Status   Specimen Description BLOOD RIGHT  FOREARM  Final   Special Requests   Final    BOTTLES DRAWN AEROBIC AND ANAEROBIC Blood Culture adequate volume   Culture   Final    NO GROWTH < 12 HOURS Performed at [facility], [address] [place], [place] [address]    Report Status PENDING  Incomplete      Radiology Studies: Ct Angio Chest Pe W And/or Wo Contrast  Result Date: 07/13/2019 CLINICAL DATA:  85 year old male with shortness of breath. On home oxygen. Status post COVID-19 in September. EXAM: CT ANGIOGRAPHY CHEST WITH CONTRAST TECHNIQUE: Multidetector CT imaging of the chest was performed using the standard protocol during bolus administration of intravenous contrast. Multiplanar CT image reconstructions and MIPs were obtained to evaluate the vascular anatomy.  CONTRAST:  100mL OMNIPAQUE IOHEXOL 350 MG/ML SOLN COMPARISON:  CTA chest 06/09/2019 and earlier. FINDINGS: Cardiovascular: Good contrast bolus timing in the pulmonary arterial tree. Mild respiratory motion. No focal filling defect identified in the pulmonary arteries to suggest acute pulmonary embolism. Calcified coronary artery atherosclerosis (series 7, image 135). Cardiac size at the upper limits of normal. No pericardial effusion. Negative visible aorta aside from atherosclerosis. Mediastinum/Nodes: Negative. No lymphadenopathy. Small gastric hiatal hernia. Lungs/Pleura: Lower lung volumes compared to October. Persistent confluent abnormal upper lobe pulmonary opacity, now ground-glass density rather than the consolidation seen in October, but in an identical configuration otherwise. Similar but less pronounced bilateral lower lobe and lingula involvement. There is some superimposed bronchiectasis. Additional scattered bilateral areas of ground-glass density in October have faded but not resolved. The major airways remain patent. No new areas of pulmonary opacity. No pleural effusions. Upper Abdomen: Surgically absent gallbladder. Stable and negative visible liver, spleen, pancreas, adrenal glands, kidneys, and bowel in the upper abdomen (small gastric hiatal hernia). Musculoskeletal: No acute osseous abnormality identified. Review of the MIP images confirms the above findings. IMPRESSION: 1. Negative for acute pulmonary embolus. 2. Since October only slight improvement in the widespread multifocal pneumonia thought due to COVID-19; the confluent areas of consolidation previously have evolved to ground-glass opacity now. 3. No new abnormality in the chest. 4. Calcified coronary artery and Aortic Atherosclerosis (ICD10-I70.0). 5. Small gastric hiatal hernia. Electronically Signed   By: Odessa FlemingH  Hall M.D.   On: 07/13/2019 21:26   Dg Chest Portable 1 View  Result Date: 07/13/2019 CLINICAL DATA:  Shortness of breath  EXAM: PORTABLE CHEST 1 VIEW COMPARISON:  CT 06/09/2019 FINDINGS: Persistent patchy bilateral areas of airspace disease most pronounced in the left mid to lower lung and the right mid lung. No pneumothorax. Partial obscuration of left hemidiaphragm may reflect trace pleural fluid. Mild tortuosity of the tracheal air column is similar to priors. The cardiomediastinal contours are unremarkable. No acute osseous or soft tissue abnormality. Degenerative changes are present in the imaged spine and shoulders. Cholecystectomy clips in the right upper quadrant. Mask wire at the base of the neck. IMPRESSION: Persistent patchy bilateral areas of airspace disease, most pronounced in the left mid to lower lung and the right mid lung, could reflect residual infection or post infectious/inflammatory change. Electronically Signed   By: Kreg ShropshirePrice  DeHay M.D.   On: 07/13/2019 19:56          Scheduled Meds:  cholecalciferol  5,000 Units Oral q morning - 10a   enoxaparin (LOVENOX) injection  40 mg Subcutaneous Daily   fluticasone  2 spray Each Nare Daily   methylPREDNISolone (SOLU-MEDROL) injection  40 mg Intravenous Q12H   pantoprazole  40 mg Oral Daily  vitamin C  1,000 mg Oral q morning - 10a   Continuous Infusions:  doxycycline (VIBRAMYCIN) IV Stopped (07/14/19 1134)     LOS: 1 day     Marcellus Scott, MD, [place], [facility]. [facility]  To contact the attending provider between 7A-7P or the covering provider during after hours 7P-7A, please log into the web site [email or web address] and access using universal [place] password for that web site. If you do not have the password, please call the hospital operator.  07/14/2019, 1:52 PM

## 2019-07-14 NOTE — H&P (Addendum)
History and Physical    [name] VZC:[id number] DOB: [birth year] DOA: 07/13/2019  PCP: Georgann Housekeeper, MD  Patient coming from: Home.  Chief Complaint: Shortness of breath.  HPI: Alejandro Hicks is a 85 y.o. male with history of hypertension GERD asthma who was recently discharged about 3 weeks ago after being treated for respiratory failure secondary to COVID-19 infection and was on a tapering dose of steroids presented to the ER because of worsening shortness of breath over the last 3 to 4 days.  Patient states since his steroid was tapered off his shortness of breath started worsening.  He becomes very short of breath with minimal exertion.  Denies any chest pain or any productive cough fever or chills.  Given the symptoms patient presented to the ER.  He was requiring more than usual oxygen to maintain his sats.   ED Course: In the ER patient was found to be hypoxic requiring about 5 L oxygen and had a CT angiogram of the chest which was negative for pulmonary embolism but does show persistent bilateral infiltrates not became more groundglass opacities.  Given hypoxia patient was given Decadron COVID-19 test was ordered is pending.  Rapid Covid test was negative.  EKG shows normal sinus rhythm with PVCs labs show creatinine 1 WBC 17.8 platelets 212 creatinine 1 BNP 23.2 lactic acid 1.  Review of Systems: As per HPI, rest all negative.   Past Medical History:  Diagnosis Date   Allergic rhinitis    Anxiety    Asthma    BPH (benign prostatic hypertrophy)    Colonic polyp    Dyslipidemia    ED (erectile dysfunction)    GERD (gastroesophageal reflux disease)    Inguinal hernia    Left S/P repair in 2012   Leukoplakia of vocal cords    OA (osteoarthritis)    OSA (obstructive sleep apnea)    Does not use CPAP   Reflux laryngitis    ENT ecal, PPIs twice a day   Rosacea     Past Surgical History:  Procedure Laterality Date   COLONOSCOPY  2009   INGUINAL  HERNIA REPAIR Left    2012     reports that he has quit smoking. He has never used smokeless tobacco. He reports current alcohol use. He reports that he does not use drugs.  Allergies  Allergen Reactions   Lipitor [Atorvastatin] Other (See Comments)    Myalgia   Zocor [Simvastatin] Other (See Comments)    Myalgia    History reviewed. No pertinent family history.  Prior to Admission medications   Medication Sig Start Date End Date Taking? Authorizing Provider  acetaminophen (TYLENOL) 325 MG tablet Take 650 mg by mouth every 6 (six) hours as needed for headache (pain).    [provider]  albuterol (PROAIR HFA) 108 (90 Base) MCG/ACT inhaler Use 2 puffs every 4 hours as needed for cough and wheeze. May use 10-20 minutes before exercise Patient taking differently: Inhale 2 puffs into the lungs every 4 (four) hours as needed for wheezing (cough or 10-20 minutes before exercise).  02/09/17   Kozlow, Alvira Philips, MD  ALPRAZolam Prudy Feeler) 0.25 MG tablet Take 1 tablet (0.25 mg total) by mouth 2 (two) times daily as needed for anxiety. 06/22/19   Tyrone Nine, MD  Ascorbic Acid (VITAMIN C) 1000 MG tablet Take 1,000 mg by mouth every morning.    [provider]  budesonide (RHINOCORT ALLERGY) 32 MCG/ACT nasal spray Place 1 spray into  both nostrils daily as needed for rhinitis.    [provider]  Cholecalciferol (VITAMIN D-3) 125 MCG (5000 UT) TABS Take 5,000 Units by mouth every morning.    [provider]  DEXILANT 60 MG capsule TAKE 1 CAPSULE (60 MG TOTAL) BY MOUTH EVERY MORNING. Patient taking differently: Take 60 mg by mouth every morning.  12/23/17   Kozlow, Alvira PhilipsEric J, MD  guaiFENesin-dextromethorphan (ROBITUSSIN DM) 100-10 MG/5ML syrup Take 10 mLs by mouth every 6 (six) hours as needed for cough. 06/06/19   Leroy SeaSingh, Prashant K, MD  ibuprofen (ADVIL) 200 MG tablet Take 400 mg by mouth every 6 (six) hours as needed for headache (pain).    [provider]    Menaquinone-7 (VITAMIN K2 PO) Take 1 tablet by mouth every morning.     [provider]  nystatin (MYCOSTATIN) 100000 UNIT/ML suspension Take 5 mLs (500,000 Units total) by mouth 4 (four) times daily. 06/22/19   Tyrone NineGrunz, Ryan B, MD  OXYGEN Inhale into the lungs continuous.    [provider]  Spacer/Aero-Holding Chambers (AEROCHAMBER PLUS FLO-VU LARGE) MISC 1 each by Other route once. 11/11/15   Baxter HireHicks, Roselyn M, MD  testosterone cypionate (DEPOTESTOSTERONE CYPIONATE) 200 MG/ML injection Inject 60 mg into the muscle once a week. 0.3 ml - 60 mg (inject into lateral thigh or buttock) 01/26/17   [provider]  valsartan-hydrochlorothiazide (DIOVAN-HCT) 320-25 MG tablet Take 1 tablet by mouth daily. Patient taking differently: Take 1 tablet by mouth every morning.  05/15/19   Monica Bectonhekkekandam, Thomas J, MD    Physical Exam: Constitutional: Moderately built and nourished. Vitals:   07/14/19 0200 07/14/19 0230 07/14/19 0300 07/14/19 0330  BP: 108/72 109/74 104/67 108/78  Pulse: 77 (!) 30 (!) 36 78  Resp: (!) 23 (!) 23 (!) 23 (!) 26  Temp:      TempSrc:      SpO2: 97% 97% 98% 97%   Eyes: Anicteric no pallor. ENMT: No discharge from the ears eyes nose or mouth. Neck: No mass felt.  No neck rigidity. Respiratory: Bilateral inspiratory crepitations heard. Cardiovascular: S1-S2 heard. Abdomen: Soft nontender bowel sounds present. Musculoskeletal: No edema. Skin: No rash. Neurologic: Alert awake oriented to time place and person.  Moves all extremities. Psychiatric: Appears normal per normal affect.   Labs on Admission: I have personally reviewed following labs and imaging studies  CBC: Recent Labs  Lab 07/13/19 1859  WBC 17.8*  NEUTROABS 13.1*  HGB 16.7  HCT 50.9  MCV 88.8  PLT 212   Basic Metabolic Panel: Recent Labs  Lab 07/13/19 1859 07/13/19 2351  NA 134*  --   K 4.4  --   CL 97*  --   CO2 27  --   GLUCOSE 104*  --   BUN 22  --   CREATININE 1.06   --   CALCIUM 8.9  --   MG  --  2.2   GFR: CrCl cannot be calculated (Unknown ideal weight.). Liver Function Tests: Recent Labs  Lab 07/13/19 1859  AST 28  ALT 46*  ALKPHOS 72  BILITOT 0.6  PROT 6.7  ALBUMIN 3.1*   No results for input(s): LIPASE, AMYLASE in the last 168 hours. No results for input(s): AMMONIA in the last 168 hours. Coagulation Profile: No results for input(s): INR, PROTIME in the last 168 hours. Cardiac Enzymes: No results for input(s): CKTOTAL, CKMB, CKMBINDEX, TROPONINI in the last 168 hours. BNP (last 3 results) No results for input(s): PROBNP in the last 8760  hours. HbA1C: No results for input(s): HGBA1C in the last 72 hours. CBG: No results for input(s): GLUCAP in the last 168 hours. Lipid Profile: No results for input(s): CHOL, HDL, LDLCALC, TRIG, CHOLHDL, LDLDIRECT in the last 72 hours. Thyroid Function Tests: No results for input(s): TSH, T4TOTAL, FREET4, T3FREE, THYROIDAB in the last 72 hours. Anemia Panel: No results for input(s): VITAMINB12, FOLATE, FERRITIN, TIBC, IRON, RETICCTPCT in the last 72 hours. Urine analysis:    Component Value Date/Time   COLORURINE YELLOW 05/30/2011 2051   APPEARANCEUR CLEAR 05/30/2011 2051   LABSPEC 1.022 05/30/2011 2051   PHURINE 6.5 05/30/2011 2051   GLUCOSEU NEGATIVE 05/30/2011 2051   HGBUR NEGATIVE 05/30/2011 2051   [place] NEGATIVE 05/30/2011 2051   [place] NEGATIVE 05/30/2011 2051   PROTEINUR NEGATIVE 05/30/2011 2051   UROBILINOGEN 0.2 05/30/2011 2051   NITRITE NEGATIVE 05/30/2011 2051   LEUKOCYTESUR NEGATIVE 05/30/2011 2051   Sepsis Labs: @LABRCNTIP (procalcitonin:4,lacticidven:4) )No results found for this or any previous visit (from the past 240 hour(s)).   Radiological Exams on Admission: Ct Angio Chest Pe W And/or Wo Contrast  Result Date: 07/13/2019 CLINICAL DATA:  85 year old male with shortness of breath. On home oxygen. Status post COVID-19 in September. EXAM: CT ANGIOGRAPHY CHEST  WITH CONTRAST TECHNIQUE: Multidetector CT imaging of the chest was performed using the standard protocol during bolus administration of intravenous contrast. Multiplanar CT image reconstructions and MIPs were obtained to evaluate the vascular anatomy. CONTRAST:  123mL OMNIPAQUE IOHEXOL 350 MG/ML SOLN COMPARISON:  CTA chest 06/09/2019 and earlier. FINDINGS: Cardiovascular: Good contrast bolus timing in the pulmonary arterial tree. Mild respiratory motion. No focal filling defect identified in the pulmonary arteries to suggest acute pulmonary embolism. Calcified coronary artery atherosclerosis (series 7, image 135). Cardiac size at the upper limits of normal. No pericardial effusion. Negative visible aorta aside from atherosclerosis. Mediastinum/Nodes: Negative. No lymphadenopathy. Small gastric hiatal hernia. Lungs/Pleura: Lower lung volumes compared to October. Persistent confluent abnormal upper lobe pulmonary opacity, now ground-glass density rather than the consolidation seen in October, but in an identical configuration otherwise. Similar but less pronounced bilateral lower lobe and lingula involvement. There is some superimposed bronchiectasis. Additional scattered bilateral areas of ground-glass density in October have faded but not resolved. The major airways remain patent. No new areas of pulmonary opacity. No pleural effusions. Upper Abdomen: Surgically absent gallbladder. Stable and negative visible liver, spleen, pancreas, adrenal glands, kidneys, and bowel in the upper abdomen (small gastric hiatal hernia). Musculoskeletal: No acute osseous abnormality identified. Review of the MIP images confirms the above findings. IMPRESSION: 1. Negative for acute pulmonary embolus. 2. Since October only slight improvement in the widespread multifocal pneumonia thought due to COVID-19; the confluent areas of consolidation previously have evolved to ground-glass opacity now. 3. No new abnormality in the chest. 4.  Calcified coronary artery and Aortic Atherosclerosis (ICD10-I70.0). 5. Small gastric hiatal hernia. Electronically Signed   By: Genevie Ann M.D.   On: 07/13/2019 21:26   Dg Chest Portable 1 View  Result Date: 07/13/2019 CLINICAL DATA:  Shortness of breath EXAM: PORTABLE CHEST 1 VIEW COMPARISON:  CT 06/09/2019 FINDINGS: Persistent patchy bilateral areas of airspace disease most pronounced in the left mid to lower lung and the right mid lung. No pneumothorax. Partial obscuration of left hemidiaphragm may reflect trace pleural fluid. Mild tortuosity of the tracheal air column is similar to priors. The cardiomediastinal contours are unremarkable. No acute osseous or soft tissue abnormality. Degenerative changes are present in the imaged spine and shoulders. Cholecystectomy clips  in the right upper quadrant. Mask wire at the base of the neck. IMPRESSION: Persistent patchy bilateral areas of airspace disease, most pronounced in the left mid to lower lung and the right mid lung, could reflect residual infection or post infectious/inflammatory change. Electronically Signed   By: Kreg Shropshire M.D.   On: 07/13/2019 19:56    EKG: Independently reviewed.  Normal sinus rhythm with frequent PVCs.  Assessment/Plan Principal Problem:   Acute respiratory failure with hypoxia (HCC) Active Problems:   Essential hypertension, benign    1. Acute respiratory failure with hypoxia likely secondary to changes to the lung secondary to multifocal pneumonia from COVID-19.  COVID-19 test is pending.  CT scan shows that patient's lung fields are changing to groundglass opacities at this time.  May need to get pulmonary consult in the morning to get further input.  For now we will continue with Decadron inhalers.  Change to nebulizer if Covid is negative.  Check troponin. 2. Hypertension -blood pressures in the low normal.  Will hold ARB.  As needed IV hydralazine for now. 3. History of asthma continue with steroids and inhalers.   On admission patient was wheezing but at the time of my exam wheezing is improved. 4. History of GERD on PPI. 5. Leukocytosis likely reactionary or secondary to steroids..  Will check procalcitonin if negative discontinue antibiotics.  Given that patient has acute respiratory failure patient will need more than 2 midnight stay in inpatient status.  Addendum -patient's Covid came back positive.  I discussed with Covid team at [facility].  They advised to change Decadron to Solu-Medrol and no indication for remdesivir since patient already received last time.   DVT prophylaxis: Lovenox. Code Status: Full code. Family Communication: Cussed with patient. Disposition Plan: Home. Consults called: None. Admission status: Inpatient.   Eduard Clos MD [facility] Pager [phone].  If 7PM-7AM, please contact night-coverage [email or web address] Password [facility]  07/14/2019, 3:38 AM

## 2019-07-14 NOTE — ED Notes (Signed)
Dr. Hal Hope notified about pt positive COVID result.

## 2019-07-17 ENCOUNTER — Institutional Professional Consult (permissible substitution): Payer: Medicare Other | Admitting: Internal Medicine

## 2019-07-19 LAB — CULTURE, BLOOD (ROUTINE X 2)
Culture: NO GROWTH
Culture: NO GROWTH
Special Requests: ADEQUATE

## 2019-07-26 ENCOUNTER — Ambulatory Visit: Payer: Medicare Other | Admitting: Internal Medicine

## 2019-07-26 ENCOUNTER — Other Ambulatory Visit: Payer: Self-pay

## 2019-07-26 ENCOUNTER — Encounter: Payer: Self-pay | Admitting: Internal Medicine

## 2019-07-26 VITALS — BP 100/64 | HR 77 | Temp 97.3°F | Ht 66.0 in | Wt 149.6 lb

## 2019-07-26 DIAGNOSIS — J8410 Pulmonary fibrosis, unspecified: Secondary | ICD-10-CM

## 2019-07-26 MED ORDER — DEXAMETHASONE 1 MG PO TABS: 1.0000 mg | ORAL_TABLET | Freq: Every day | ORAL | 5 refills | Status: DC

## 2019-07-26 NOTE — Patient Instructions (Signed)
The patient should have follow up scheduled with myself in 3 months.   Prior to next visit patient should have a full set of PFTs ASAP including:  Spirometry with bronchodilator if obstructed Lung Volumes DLCO FeNO

## 2019-07-26 NOTE — Progress Notes (Addendum)
MARLOW BERENGUER    [id number]    [birth year]  Primary Care Physician:Husain, Jerelyn Scott, MD  Referring Physician: Georgann Housekeeper, MD [address] [place],  [place] [address] Reason for Consultation: COVID-19 infection Date of Consultation: 07/26/2019  Chief complaint:   Chief Complaint  Patient presents with  . Pulmonary Consult     HPI: Mr. Sudduth is an 85 year old gentleman who presents for new patient evaluation.  He is accompanied by his daughter.  In September he was on a golf trip with several of his friends in [place], he subsequently contracted COVID-19 infection.  He was hospitalized at [facility] and treated with IV remdesivir and steroids.  Was discharged home with oxygen, and represented only 3 days later with worsening dyspnea.  Admitted for second time to [facility] and this time treated for a superimposed bacterial infection empirically.  Was again discharged home on 10/29, and represented on 11/19 with dyspnea.  He has been treated with steroids once with prednisone, and now with Decadron.  Is currently on a Decadron taper down to 2 mg daily.  He feels that the steroids give him a good energy, appetite.  He felt like when he came off the steroids in between,  He crashed.    He has a history of OSA, does not wear CPAP -diagnosed over 10 years ago. Has GERD and is on dexilant daily.  He does have recurrent sinus issues that comes with seasonal allergies.  He has seen Dr. Lucie Leather in the past for LPR, and allergic rhinitis.  He does take Flonase daily.  In February 2019 he was having a cough and primary care to get a chest x-ray.  It does show some possible bilateral interstitial changes, but this may be just low lung volumes.  Up until his diagnosis with COVID-19 pneumonia, he was golfing and very active.  He did not have a daily cough or dyspnea.  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  Smoking history: quit in 1970s  Social History   Occupational History  . Not on file  Tobacco Use  . Smoking status: Former Smoker    Packs/day: 0.75    Years: 10.00    Pack years: 7.50    Types: Cigarettes    Quit date: 1970    Years since quitting: 50.9  . Smokeless tobacco: Never Used  Substance and Sexual Activity  . Alcohol use: Yes    Comment: social  . Drug use: No  . Sexual activity: Not on file    Relevant family history:  Family History  Problem Relation Age of Onset  . Emphysema Father   . Asthma Neg Hx     Past Medical History:  Diagnosis Date  . Allergic rhinitis   . Anxiety   . Asthma   . BPH (benign prostatic hypertrophy)   . Colonic polyp   . Dyslipidemia   . ED (erectile dysfunction)   . GERD (gastroesophageal reflux disease)   . Inguinal hernia    Left S/P repair in 2012  . Leukoplakia of vocal cords   . OA (osteoarthritis)   . OSA (obstructive sleep apnea)    Does not use CPAP  . Reflux laryngitis  ENT ecal, PPIs twice a day  . Rosacea     Past Surgical History:  Procedure Laterality Date  . COLONOSCOPY  2009  . INGUINAL HERNIA REPAIR Left    2012     Review of systems: Review of Systems  Constitutional: Positive for malaise/fatigue. Negative for chills, fever and weight loss.  HENT: Positive for congestion. Negative for sinus pain and sore throat.   Eyes: Negative for discharge and redness.  Respiratory: Positive for cough, sputum production and shortness of breath. Negative for hemoptysis and wheezing.   Cardiovascular: Negative for chest pain, palpitations and leg swelling.  Gastrointestinal: Positive for heartburn. Negative for nausea and vomiting.  Musculoskeletal: Negative for joint pain and myalgias.  Skin: Negative for rash.  Neurological: Negative  for dizziness, tremors, focal weakness and headaches.  Endo/Heme/Allergies: Positive for environmental allergies.  Psychiatric/Behavioral: Negative for depression. The patient is not nervous/anxious.   All other systems reviewed and are negative.   Physical Exam: Blood pressure 100/64, pulse 77, temperature (!) 97.3 F (36.3 C), temperature source Temporal, height 5\' 6"  (1.676 m), weight 149 lb 9.6 oz (67.9 kg), SpO2 96 %. Gen:      Resting dyspnea Eyes: EOMI, sclera anicteric ENT:  no nasal polyps, mucus membranes moist Neck:     Supple, no thyromegaly Lungs:    Increased work of breathing, symmetric chest wall excursion, shallow inspirations bilaterally, no wheezes, there are bibasilar crackles. CV:         Regular rate and rhythm; no murmurs, rubs, or gallops.  No pedal edema Abd:      + bowel sounds; soft, non-tender; no distension MSK: no acute synovitis of DIP or PIP joints, no mechanics hands.  Skin:      Warm and dry; no rashes Neuro: normal speech, no focal facial asymmetry Psych: alert and oriented x3, normal mood and affect  Data Reviewed: Imaging: I have personally reviewed the CT chest from October and November 2020.  In the October CT there is bilateral consolidative changes with air bronchograms, concerning for an organizing pneumonia pattern.  After treatment of steroids, there is some improvement in this dense consolidation, but persistence of traction bronchiectasis and some groundglass opacities diffusely.  PFTs:  Not on file Labs: Lab Results  Component Value Date   WBC 11.7 (H) 07/14/2019   HGB 13.1 07/14/2019   HCT 41.4 07/14/2019   MCV 90.8 07/14/2019   PLT 159 07/14/2019   Lab Results  Component Value Date   NA 135 07/14/2019   K 4.8 07/14/2019   CL 102 07/14/2019   CO2 24 07/14/2019    Immunization status: Immunization History  Administered Date(s) Administered  . Influenza, High Dose Seasonal PF 04/25/2019  . Influenza-Unspecified 06/13/2015     Assessment:  Mr. Eliberto Ivory is an 85 year old gentleman with a history of allergic rhinitis, possible asthma.  Presents with complications of hypoxemic respiratory failure and pulmonary fibrosis after COVID-19 infection.  1. COVID-19 pneumonia 2.  Acute on chronic hypoxemic respiratory failure 3.  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  Plan/Recommendations: Unfortunately [name] has had significant complications after his [id number]-30 pneumonia.  I do believe that some of the earlier changes in October were likely steroid responsive given their dense consolidative pattern.  There has been some report of an organizing pneumonia pattern after COVID-19 pneumonia which is probably why he responded to prednisone .  However there are now changes of traction bronchiectasis, as well as lower lobe bronchiectasis which appears postinfectious in nature.  I am unclear how much this will now respond to steroids. We may do some repeat imaging at some point to see how much of this is residual.   I discussed with his daughter and the patient at some length about the likely chronic and irreversible nature of this fibrosis.  I do believe that the majority of our focus will be on symptom management.  I am referring him to pulmonary rehabilitation, as many post Covid patients with fibrosis have responded well to symptom management and dyspnea.  He should continue his oxygen, and can take ProAir as needed if it is helpful.   Truly feels that the Decadron even at a low dose is helping his appetite, and energy levels.  We discussed the pros and cons of long-term glucocorticoid therapy and at this point I decided that the benefits of energy and appetite will outweigh any mood or sleep changes, or long-term consequences of diabetes hypertension osteoporosis etc.   I have let him know that there is increasing interest in starting antifibrotic therapy in patients with COVID-19 pneumonia fibrosis.  I will  make him aware if there are any clinical trials or options for treatment beyond oxygen and rehab.  Return to Care: Return in about 3 months (around 10/24/2019).  Durel SaltsNikita Jermario Kalmar, MD Pulmonary and Critical Care Medicine [facility] [phone]  CC: Georgann HousekeeperHusain, Karrar, MD

## 2019-07-27 NOTE — Addendum Note (Signed)
Addended by: Collier Salina on: 07/27/2019 08:31 AM   Modules accepted: Orders

## 2019-07-31 ENCOUNTER — Emergency Department (HOSPITAL_COMMUNITY): Payer: Medicare Other

## 2019-07-31 ENCOUNTER — Telehealth: Payer: Self-pay | Admitting: Internal Medicine

## 2019-07-31 ENCOUNTER — Other Ambulatory Visit: Payer: Self-pay

## 2019-07-31 ENCOUNTER — Inpatient Hospital Stay (HOSPITAL_COMMUNITY)
Admission: EM | Admit: 2019-07-31 | Discharge: 2019-08-02 | DRG: 196 | Disposition: A | Discharge status: Hospice | Payer: Medicare Other | Attending: Family Medicine | Admitting: Family Medicine

## 2019-07-31 ENCOUNTER — Encounter (HOSPITAL_COMMUNITY): Payer: Self-pay | Admitting: Emergency Medicine

## 2019-07-31 DIAGNOSIS — M1990 Unspecified osteoarthritis, unspecified site: Secondary | ICD-10-CM | POA: Diagnosis present

## 2019-07-31 DIAGNOSIS — I10 Essential (primary) hypertension: Secondary | ICD-10-CM | POA: Diagnosis present

## 2019-07-31 DIAGNOSIS — Z515 Encounter for palliative care: Secondary | ICD-10-CM | POA: Diagnosis not present

## 2019-07-31 DIAGNOSIS — F419 Anxiety disorder, unspecified: Secondary | ICD-10-CM | POA: Diagnosis present

## 2019-07-31 DIAGNOSIS — J9621 Acute and chronic respiratory failure with hypoxia: Secondary | ICD-10-CM | POA: Diagnosis present

## 2019-07-31 DIAGNOSIS — Z888 Allergy status to other drugs, medicaments and biological substances status: Secondary | ICD-10-CM

## 2019-07-31 DIAGNOSIS — Z79899 Other long term (current) drug therapy: Secondary | ICD-10-CM

## 2019-07-31 DIAGNOSIS — J9690 Respiratory failure, unspecified, unspecified whether with hypoxia or hypercapnia: Secondary | ICD-10-CM | POA: Diagnosis present

## 2019-07-31 DIAGNOSIS — J84112 Idiopathic pulmonary fibrosis: Secondary | ICD-10-CM | POA: Diagnosis not present

## 2019-07-31 DIAGNOSIS — J432 Centrilobular emphysema: Secondary | ICD-10-CM | POA: Diagnosis present

## 2019-07-31 DIAGNOSIS — Z7982 Long term (current) use of aspirin: Secondary | ICD-10-CM

## 2019-07-31 DIAGNOSIS — J9620 Acute and chronic respiratory failure, unspecified whether with hypoxia or hypercapnia: Secondary | ICD-10-CM

## 2019-07-31 DIAGNOSIS — J9601 Acute respiratory failure with hypoxia: Secondary | ICD-10-CM

## 2019-07-31 DIAGNOSIS — R0602 Shortness of breath: Secondary | ICD-10-CM | POA: Diagnosis not present

## 2019-07-31 DIAGNOSIS — I959 Hypotension, unspecified: Secondary | ICD-10-CM | POA: Diagnosis present

## 2019-07-31 DIAGNOSIS — G4733 Obstructive sleep apnea (adult) (pediatric): Secondary | ICD-10-CM | POA: Diagnosis present

## 2019-07-31 DIAGNOSIS — Z825 Family history of asthma and other chronic lower respiratory diseases: Secondary | ICD-10-CM

## 2019-07-31 DIAGNOSIS — K219 Gastro-esophageal reflux disease without esophagitis: Secondary | ICD-10-CM | POA: Diagnosis present

## 2019-07-31 DIAGNOSIS — Z87891 Personal history of nicotine dependence: Secondary | ICD-10-CM | POA: Diagnosis not present

## 2019-07-31 DIAGNOSIS — M5136 Other intervertebral disc degeneration, lumbar region: Secondary | ICD-10-CM | POA: Diagnosis present

## 2019-07-31 DIAGNOSIS — Z79891 Long term (current) use of opiate analgesic: Secondary | ICD-10-CM

## 2019-07-31 DIAGNOSIS — Z7189 Other specified counseling: Secondary | ICD-10-CM | POA: Diagnosis not present

## 2019-07-31 DIAGNOSIS — B948 Sequelae of other specified infectious and parasitic diseases: Secondary | ICD-10-CM

## 2019-07-31 DIAGNOSIS — E785 Hyperlipidemia, unspecified: Secondary | ICD-10-CM | POA: Diagnosis present

## 2019-07-31 DIAGNOSIS — Z66 Do not resuscitate: Secondary | ICD-10-CM | POA: Diagnosis present

## 2019-07-31 DIAGNOSIS — Z7952 Long term (current) use of systemic steroids: Secondary | ICD-10-CM

## 2019-07-31 DIAGNOSIS — D696 Thrombocytopenia, unspecified: Secondary | ICD-10-CM | POA: Diagnosis present

## 2019-07-31 DIAGNOSIS — J8410 Pulmonary fibrosis, unspecified: Principal | ICD-10-CM | POA: Diagnosis present

## 2019-07-31 DIAGNOSIS — N400 Enlarged prostate without lower urinary tract symptoms: Secondary | ICD-10-CM | POA: Diagnosis present

## 2019-07-31 DIAGNOSIS — J9622 Acute and chronic respiratory failure with hypercapnia: Secondary | ICD-10-CM | POA: Diagnosis not present

## 2019-07-31 DIAGNOSIS — J45909 Unspecified asthma, uncomplicated: Secondary | ICD-10-CM | POA: Diagnosis present

## 2019-07-31 DIAGNOSIS — M51369 Other intervertebral disc degeneration, lumbar region without mention of lumbar back pain or lower extremity pain: Secondary | ICD-10-CM | POA: Diagnosis present

## 2019-07-31 DIAGNOSIS — Z791 Long term (current) use of non-steroidal anti-inflammatories (NSAID): Secondary | ICD-10-CM | POA: Diagnosis not present

## 2019-07-31 DIAGNOSIS — U071 COVID-19: Secondary | ICD-10-CM | POA: Diagnosis not present

## 2019-07-31 DIAGNOSIS — Z86010 Personal history of colonic polyps: Secondary | ICD-10-CM

## 2019-07-31 DIAGNOSIS — J8489 Other specified interstitial pulmonary diseases: Secondary | ICD-10-CM | POA: Diagnosis present

## 2019-07-31 LAB — COMPREHENSIVE METABOLIC PANEL
ALT: 35 U/L (ref 0–44)
AST: 38 U/L (ref 15–41)
Albumin: 2.9 g/dL — ABNORMAL LOW (ref 3.5–5.0)
Alkaline Phosphatase: 53 U/L (ref 38–126)
Anion gap: 10 (ref 5–15)
BUN: 26 mg/dL — ABNORMAL HIGH (ref 8–23)
CO2: 31 mmol/L (ref 22–32)
Calcium: 9.4 mg/dL (ref 8.9–10.3)
Chloride: 98 mmol/L (ref 98–111)
Creatinine, Ser: 0.86 mg/dL (ref 0.61–1.24)
GFR calc Af Amer: >60 mL/min (ref >60)
GFR calc non Af Amer: >60 mL/min (ref >60)
Glucose, Bld: 117 mg/dL — ABNORMAL HIGH (ref 70–99)
Potassium: 4.7 mmol/L (ref 3.5–5.1)
Sodium: 139 mmol/L (ref 135–145)
Total Bilirubin: 1.5 mg/dL — ABNORMAL HIGH (ref 0.3–1.2)
Total Protein: 6.6 g/dL (ref 6.5–8.1)

## 2019-07-31 LAB — POCT I-STAT 7, (LYTES, BLD GAS, ICA,H+H)
Acid-Base Excess: 6 mmol/L — ABNORMAL HIGH (ref 0.0–2.0)
Bicarbonate: 32.8 mmol/L — ABNORMAL HIGH (ref 20.0–28.0)
Calcium, Ion: 1.27 mmol/L (ref 1.15–1.40)
HCT: 43 % (ref 39.0–52.0)
Hemoglobin: 14.6 g/dL (ref 13.0–17.0)
O2 Saturation: 100 %
Patient temperature: 98.2
Potassium: 4.9 mmol/L (ref 3.5–5.1)
Sodium: 136 mmol/L (ref 135–145)
TCO2: 34 mmol/L — ABNORMAL HIGH (ref 22–32)
pCO2 arterial: 52.7 mmHg — ABNORMAL HIGH (ref 32.0–48.0)
pH, Arterial: 7.401 (ref 7.350–7.450)
pO2, Arterial: 221 mmHg — ABNORMAL HIGH (ref 83.0–108.0)

## 2019-07-31 LAB — URINALYSIS, ROUTINE W REFLEX MICROSCOPIC
Bilirubin Urine: NEGATIVE
Glucose, UA: NEGATIVE mg/dL
Hgb urine dipstick: NEGATIVE
Ketones, ur: NEGATIVE mg/dL
Leukocytes,Ua: NEGATIVE
Nitrite: NEGATIVE
Protein, ur: 100 mg/dL — AB
Specific Gravity, Urine: 1.023 (ref 1.005–1.030)
pH: 6 (ref 5.0–8.0)

## 2019-07-31 LAB — CBC WITH DIFFERENTIAL/PLATELET
Abs Immature Granulocytes: 0.1 10*3/uL — ABNORMAL HIGH (ref 0.00–0.07)
Basophils Absolute: 0 10*3/uL (ref 0.0–0.1)
Basophils Relative: 0 %
Eosinophils Absolute: 0.5 10*3/uL (ref 0.0–0.5)
Eosinophils Relative: 4 %
HCT: 49.2 % (ref 39.0–52.0)
Hemoglobin: 15.8 g/dL (ref 13.0–17.0)
Immature Granulocytes: 1 %
Lymphocytes Relative: 6 %
Lymphs Abs: 0.8 10*3/uL (ref 0.7–4.0)
MCH: 29.4 pg (ref 26.0–34.0)
MCHC: 32.1 g/dL (ref 30.0–36.0)
MCV: 91.4 fL (ref 80.0–100.0)
Monocytes Absolute: 0.8 10*3/uL (ref 0.1–1.0)
Monocytes Relative: 6 %
Neutro Abs: 10.7 10*3/uL — ABNORMAL HIGH (ref 1.7–7.7)
Neutrophils Relative %: 83 %
Platelets: 159 10*3/uL (ref 150–400)
RBC: 5.38 MIL/uL (ref 4.22–5.81)
RDW: 16.1 % — ABNORMAL HIGH (ref 11.5–15.5)
WBC: 12.8 10*3/uL — ABNORMAL HIGH (ref 4.0–10.5)
nRBC: 0 % (ref 0.0–0.2)

## 2019-07-31 LAB — POC SARS CORONAVIRUS 2 AG -  ED: SARS Coronavirus 2 Ag: NEGATIVE

## 2019-07-31 LAB — LACTIC ACID, PLASMA
Lactic Acid, Venous: 1.3 mmol/L (ref 0.5–1.9)
Lactic Acid, Venous: 1.5 mmol/L (ref 0.5–1.9)

## 2019-07-31 LAB — PROTIME-INR
INR: 1 (ref 0.8–1.2)
Prothrombin Time: 13.2 seconds (ref 11.4–15.2)

## 2019-07-31 MED ORDER — ONDANSETRON HCL 4 MG/2ML IJ SOLN: 4.0000 mg | Freq: Four times a day (QID) | INTRAMUSCULAR | Status: DC | PRN

## 2019-07-31 MED ORDER — GUAIFENESIN ER 600 MG PO TB12
600.0000 mg | ORAL_TABLET | Freq: Two times a day (BID) | ORAL | Status: DC
  Administered 2019-08-01 – 2019-08-02 (×4): 600 mg via ORAL
  Filled 2019-07-31 (×4): qty 1

## 2019-07-31 MED ORDER — ALPRAZOLAM 0.25 MG PO TABS
0.2500 mg | ORAL_TABLET | Freq: Every day | ORAL | Status: DC
  Administered 2019-08-01 (×2): 0.25 mg via ORAL
  Filled 2019-07-31 (×2): qty 1

## 2019-07-31 MED ORDER — IPRATROPIUM-ALBUTEROL 0.5-2.5 (3) MG/3ML IN SOLN
3.0000 mL | Freq: Four times a day (QID) | RESPIRATORY_TRACT | Status: DC
  Administered 2019-08-01 – 2019-08-02 (×3): 3 mL via RESPIRATORY_TRACT
  Filled 2019-07-31 (×5): qty 3

## 2019-07-31 MED ORDER — ENOXAPARIN SODIUM 40 MG/0.4ML ~~LOC~~ SOLN
40.0000 mg | SUBCUTANEOUS | Status: DC
  Administered 2019-08-01 – 2019-08-02 (×3): 40 mg via SUBCUTANEOUS
  Filled 2019-07-31 (×3): qty 0.4

## 2019-07-31 MED ORDER — VITAMIN C 500 MG PO TABS
1000.0000 mg | ORAL_TABLET | Freq: Two times a day (BID) | ORAL | Status: DC
  Administered 2019-08-01 – 2019-08-02 (×4): 1000 mg via ORAL
  Filled 2019-07-31 (×4): qty 2

## 2019-07-31 MED ORDER — ACETAMINOPHEN 500 MG PO TABS: 1000.0000 mg | ORAL_TABLET | Freq: Four times a day (QID) | ORAL | Status: DC | PRN

## 2019-07-31 MED ORDER — METHYLPREDNISOLONE SODIUM SUCC 40 MG IJ SOLR
40.0000 mg | Freq: Two times a day (BID) | INTRAMUSCULAR | Status: DC
  Administered 2019-08-01 – 2019-08-02 (×4): 40 mg via INTRAVENOUS
  Filled 2019-07-31 (×4): qty 1

## 2019-07-31 MED ORDER — FLUTICASONE PROPIONATE 50 MCG/ACT NA SUSP
1.0000 | Freq: Every day | NASAL | Status: DC
  Administered 2019-08-02: 1 via NASAL
  Filled 2019-07-31 (×2): qty 16

## 2019-07-31 MED ORDER — HYDROCODONE-ACETAMINOPHEN 5-325 MG PO TABS: 1.0000 | ORAL_TABLET | ORAL | Status: DC | PRN

## 2019-07-31 MED ORDER — DEXTROMETHORPHAN POLISTIREX ER 30 MG/5ML PO SUER
60.0000 mg | Freq: Two times a day (BID) | ORAL | Status: DC | PRN
  Filled 2019-07-31: qty 10

## 2019-07-31 MED ORDER — IOHEXOL 350 MG/ML SOLN
75.0000 mL | Freq: Once | INTRAVENOUS | Status: AC | PRN
  Administered 2019-07-31: 75 mL via INTRAVENOUS

## 2019-07-31 MED ORDER — IPRATROPIUM-ALBUTEROL 20-100 MCG/ACT IN AERS: 1.0000 | INHALATION_SPRAY | Freq: Four times a day (QID) | RESPIRATORY_TRACT | Status: DC

## 2019-07-31 MED ORDER — PANTOPRAZOLE SODIUM 40 MG PO TBEC
40.0000 mg | DELAYED_RELEASE_TABLET | Freq: Every day | ORAL | Status: DC
  Administered 2019-08-01 – 2019-08-02 (×3): 40 mg via ORAL
  Filled 2019-07-31 (×3): qty 1

## 2019-07-31 MED ORDER — ASPIRIN EC 81 MG PO TBEC
81.0000 mg | DELAYED_RELEASE_TABLET | Freq: Every day | ORAL | Status: DC
  Administered 2019-08-01 – 2019-08-02 (×3): 81 mg via ORAL
  Filled 2019-07-31 (×3): qty 1

## 2019-07-31 MED ORDER — ONDANSETRON HCL 4 MG PO TABS: 4.0000 mg | ORAL_TABLET | Freq: Four times a day (QID) | ORAL | Status: DC | PRN

## 2019-07-31 MED ORDER — SODIUM CHLORIDE 0.9 % IV SOLN
INTRAVENOUS | Status: DC
  Administered 2019-08-01: 01:00:00 via INTRAVENOUS

## 2019-07-31 MED ORDER — VITAMIN D 25 MCG (1000 UNIT) PO TABS
5000.0000 [IU] | ORAL_TABLET | Freq: Every morning | ORAL | Status: DC
  Administered 2019-08-01 – 2019-08-02 (×2): 5000 [IU] via ORAL
  Filled 2019-07-31 (×2): qty 5

## 2019-07-31 NOTE — Telephone Encounter (Signed)
I would recommend increasing his steroids back up.  He has been on 1 mg decadron and I would bump him back up to 6 mg daily as a burst to see if he has any response.  Would set him up for a telephone visit with me in 2-3 weeks if he is able to so we can follow up response.

## 2019-07-31 NOTE — ED Triage Notes (Signed)
Pt reports increased SOB over the last 2 days, denies recent fever, reports some cough. Pt alert, oriented x4.

## 2019-07-31 NOTE — Telephone Encounter (Signed)
Triage,Please see Dr. Mauricio Po response.  Please increase patient's Decadron to 6 mg daily.  Set up as telephone visit with Dr. Shearon Stalls in 2 weeks to further evaluate.Wyn Quaker, FNP

## 2019-07-31 NOTE — ED Notes (Signed)
Pt called out stating it couldn't breathe, this rn and Tanya, EMT entered the room to find pt saturations 64% with oxygen cord not connected to mask. Pt repositioned in the bed and NRB applied at 15L, pt oxygen saturations increased to 97%, pt brought back down to 10L on NRB with oxygen saturations at 94%, pt reports some improvement in breathing but pt respiratory rate still 35-40.

## 2019-07-31 NOTE — ED Notes (Signed)
Pt placed on bedpan

## 2019-07-31 NOTE — H&P (Addendum)
History and Physical    DOA: 07/31/2019  PCP: Georgann Housekeeper, MD  Patient coming from: Home  Chief Complaint: Progressive dyspnea  HPI: Alejandro Hicks is a 85 y.o. male with h/o asthma, HLD, GERD, OSA not on CPAP, HTN, anxiety, tobacco abuse, recent 3 hospitalizations 10/3-10/13 for Covid 19 pneumonia treated with remdesivir and systemic steroids, SNF recommended but he declined and went home, readmitted 10/16-10/29 for acute on chronic hypoxic respiratory failure, treated for suspected pneumonia complicating recent COVID-19 pneumonia, with ceftriaxone and doxycycline, discharged to SNF on tapering steroids, presented again to ED on 07/13/2019 due to progressive dyspnea on minimal exertion with acute on chronic hypoxic respiratory failure secondary to sequelae from recent COVID-19 pneumonia at which time CT chest was obtained which resulted negative for PE, repeat Covid testing was again positive, Decadron was switched to Solu-Medrol, doxycycline was continued, pulmonology was consulted for assistance and ultimately patient was discharged home the next day (07/14/2019) with home O2 3 L to be titrated as needed per home pulse ox readings.  Today patient presents to the ED with dyspnea and increasing oxygen requirements up to 10 L O2.  According to patient and daughter, he was requiring 6 to 8 L of O2 with minimal exertion like going to the bathroom even upon last discharge.  Over the last 3 to 4 days he is gotten progressively dyspneic.  He was following pulmonary as outpatient and steroids were being tapered slowly.  Pulmonary advised family of grave prognosis due to irreversible nature of post Covid fibrosis/resultant progressive hypoxia. Last night patient had significant event with air hunger/dyspnea even at rest.  Daughter bumped up his 0 2 to 8 L, he was still satting only at 90% subsequent to which he increased his O2 level to 10 L and brought him to the ED upon MDs advise.  Patient currently  resting comfortably and satting 97% on 100% NRB M.  Bedside monitor shows mild hypotension with systolic 90s to 100s.  Reports taking Diovan at home but eating poorly lately.  Is hungry and eager to eat.  He understands poor prognosis and stated would not want to be intubated if he were to decline with external O2 support.  Review of Systems: As per HPI otherwise 10 point review of systems negative.    Past Medical History:  Diagnosis Date  . Allergic rhinitis   . Anxiety   . Asthma   . BPH (benign prostatic hypertrophy)   . Colonic polyp   . Dyslipidemia   . ED (erectile dysfunction)   . GERD (gastroesophageal reflux disease)   . Inguinal hernia    Left S/P repair in 2012  . Leukoplakia of vocal cords   . OA (osteoarthritis)   . OSA (obstructive sleep apnea)    Does not use CPAP  . Reflux laryngitis    ENT ecal, PPIs twice a day  . Rosacea     Past Surgical History:  Procedure Laterality Date  . COLONOSCOPY  2009  . INGUINAL HERNIA REPAIR Left    2012    Social history:  reports that he quit smoking about 50 years ago. His smoking use included cigarettes. He has a 7.50 pack-year smoking history. He has never used smokeless tobacco. He reports current alcohol use. He reports that he does not use drugs.   Allergies  Allergen Reactions  . Lipitor [Atorvastatin] Other (See Comments)    Myalgia  . Zocor [Simvastatin] Other (See Comments)    Myalgia    Family  History  Problem Relation Age of Onset  . Emphysema Father   . Asthma Neg Hx       Prior to Admission medications   Medication Sig Start Date End Date Taking? Authorizing Provider  acetaminophen (TYLENOL) 500 MG tablet Take 1,000 mg by mouth every 6 (six) hours as needed for headache (pain).    [provider]  albuterol (PROAIR HFA) 108 (90 Base) MCG/ACT inhaler Use 2 puffs every 4 hours as needed for cough and wheeze. May use 10-20 minutes before exercise Patient taking differently: Inhale 2 puffs  into the lungs every 4 (four) hours as needed for wheezing (cough or 10-20 minutes before exercise).  02/09/17   Kozlow, Alvira Philips, MD  ALPRAZolam Prudy Feeler) 0.25 MG tablet Take 1 tablet (0.25 mg total) by mouth at bedtime. 07/14/19   Hongalgi, Maximino Greenland, MD  Ascorbic Acid (VITAMIN C) 1000 MG tablet Take 1,000 mg by mouth 2 (two) times daily.     [provider]  aspirin EC 81 MG tablet Take 81 mg by mouth daily.    [provider]  Cholecalciferol (VITAMIN D-3) 125 MCG (5000 UT) TABS Take 5,000 Units by mouth every morning.    [provider]  dexamethasone (DECADRON) 1 MG tablet Take 5 tabs (5 mg total) daily for 4 days, then 4 tabs (4 mg total) daily for 4 days, then 3 tabs (3 mg total) daily for 4 days, then 2 tabs (2 mg total) daily for 4 days, then 1 tab (1 mg total) daily for 4 days, then stop. 07/14/19   Hongalgi, Maximino Greenland, MD  dexamethasone (DECADRON) 1 MG tablet Take 1 tablet (1 mg total) by mouth daily. 07/26/19   Charlott Holler, MD  DEXILANT 60 MG capsule TAKE 1 CAPSULE (60 MG TOTAL) BY MOUTH EVERY MORNING. Patient taking differently: Take 60 mg by mouth every morning.  12/23/17   Kozlow, Alvira Philips, MD  dextromethorphan (DELSYM) 30 MG/5ML liquid Take 60 mg by mouth 2 (two) times daily as needed for cough.    [provider]  fluticasone (FLONASE) 50 MCG/ACT nasal spray Place 1 spray into both nostrils daily.    [provider]  guaiFENesin-dextromethorphan (ROBITUSSIN DM) 100-10 MG/5ML syrup Take 10 mLs by mouth every 6 (six) hours as needed for cough. 06/06/19   Leroy Sea, MD  ibuprofen (ADVIL) 200 MG tablet Take 400 mg by mouth every 6 (six) hours as needed for headache (pain).    [provider]  Menaquinone-7 (VITAMIN K2 PO) Take 1 tablet by mouth every morning.     [provider]  OXYGEN Inhale 3 L/min into the lungs See admin instructions. Continuous - 3L at rest/ 6 L with exertion    [provider]   Spacer/Aero-Holding Chambers (AEROCHAMBER PLUS FLO-VU LARGE) MISC 1 each by Other route once. 11/11/15   Baxter Hire, MD  testosterone cypionate (DEPOTESTOSTERONE CYPIONATE) 200 MG/ML injection Inject 60 mg into the muscle once a week. 0.3 ml - 60 mg (inject into lateral thigh or buttock) 01/26/17   [provider]  valsartan-hydrochlorothiazide (DIOVAN-HCT) 320-25 MG tablet Take 0.5 tablets by mouth at bedtime. 07/14/19   Elease Etienne, MD    Physical Exam: Vitals:   07/31/19 1608 07/31/19 1615 07/31/19 1630 07/31/19 1700  BP: (!) 103/59 104/71 110/67 104/66  Pulse: 71 74 76 73  Resp: (!) 24 (!) 36 (!) 34 (!) 34  Temp:      TempSrc:  SpO2: 97% 98% 97% 97%  Weight:      Height:        Constitutional: Elderly male, resting comfortably and satting well on 100% NRB M Eyes: PERRL, lids and conjunctivae normal ENMT: Mucous membranes are moist. Posterior pharynx clear of any exudate or lesions.Normal dentition.  Neck: normal, supple, no masses, no thyromegaly Respiratory: Scattered mild wheezing, basilar coarse Velcro crepitus.  Tachypneic while talking full sentences without accessory muscle use.  Cardiovascular: Regular rate and rhythm, no murmurs / rubs / gallops. No extremity edema. 2+ pedal pulses. No carotid bruits.  Abdomen: no tenderness, no masses palpated. No hepatosplenomegaly. Bowel sounds positive.  Musculoskeletal: no clubbing / cyanosis. No joint deformity upper and lower extremities. Good ROM, no contractures. Normal muscle tone.  Neurologic: CN 2-12 grossly intact. Sensation intact, DTR normal. Strength 5/5 in all 4.  Psychiatric: Normal judgment and insight. Alert and oriented x 3. Normal mood.  SKIN/catheters: no rashes, lesions, ulcers. No induration  Labs on Admission: I have personally reviewed following labs and imaging studies  CBC: Recent Labs  Lab 07/31/19 1225  WBC 12.8*  NEUTROABS 10.7*  HGB 15.8  HCT 49.2  MCV 91.4  PLT 159   Basic  Metabolic Panel: Recent Labs  Lab 07/31/19 1225  NA 139  K 4.7  CL 98  CO2 31  GLUCOSE 117*  BUN 26*  CREATININE 0.86  CALCIUM 9.4   GFR: Estimated Creatinine Clearance: 58.7 mL/min (by C-G formula based on SCr of 0.86 mg/dL). Liver Function Tests: Recent Labs  Lab 07/31/19 1225  AST 38  ALT 35  ALKPHOS 53  BILITOT 1.5*  PROT 6.6  ALBUMIN 2.9*   No results for input(s): LIPASE, AMYLASE in the last 168 hours. No results for input(s): AMMONIA in the last 168 hours. Coagulation Profile: Recent Labs  Lab 07/31/19 1225  INR 1.0   Cardiac Enzymes: No results for input(s): CKTOTAL, CKMB, CKMBINDEX, TROPONINI in the last 168 hours. BNP (last 3 results) No results for input(s): PROBNP in the last 8760 hours. HbA1C: No results for input(s): HGBA1C in the last 72 hours. CBG: No results for input(s): GLUCAP in the last 168 hours. Lipid Profile: No results for input(s): CHOL, HDL, LDLCALC, TRIG, CHOLHDL, LDLDIRECT in the last 72 hours. Thyroid Function Tests: No results for input(s): TSH, T4TOTAL, FREET4, T3FREE, THYROIDAB in the last 72 hours. Anemia Panel: No results for input(s): VITAMINB12, FOLATE, FERRITIN, TIBC, IRON, RETICCTPCT in the last 72 hours. Urine analysis:    Component Value Date/Time   COLORURINE YELLOW 07/31/2019 1525   APPEARANCEUR HAZY (A) 07/31/2019 1525   LABSPEC 1.023 07/31/2019 1525   PHURINE 6.0 07/31/2019 1525   GLUCOSEU NEGATIVE 07/31/2019 1525   HGBUR NEGATIVE 07/31/2019 1525   BILIRUBINUR NEGATIVE 07/31/2019 1525   KETONESUR NEGATIVE 07/31/2019 1525   PROTEINUR 100 (A) 07/31/2019 1525   UROBILINOGEN 0.2 05/30/2011 2051   NITRITE NEGATIVE 07/31/2019 1525   LEUKOCYTESUR NEGATIVE 07/31/2019 1525    Radiological Exams on Admission: Personally reviewed  Dg Chest Portable 1 View  Result Date: 07/31/2019 CLINICAL DATA:  Tachypnea.  COVID positive. EXAM: PORTABLE CHEST 1 VIEW COMPARISON:  CT 07/13/2019.  Chest x-ray 07/13/2019. FINDINGS:  Mediastinum and hilar structures normal. Diffuse bilateral pulmonary infiltrates again noted without significant change. No pleural effusion or pneumothorax. Degenerative change thoracic spine. IMPRESSION: Diffuse bilateral pulmonary infiltrates again noted without interim change in this known COVID 19 positive patient. Electronically Signed   By: Maisie Fushomas  Register   On: 07/31/2019 13:06  EKG: Independently reviewed.  Sinus tach with ventricular trigeminy     Assessment and Plan:   Principal Problem:   Acute respiratory failure with hypoxia (HCC) Active Problems:   Lumbar degenerative disc disease   Essential hypertension, benign   GERD (gastroesophageal reflux disease)   COVID-19    1.  Acute hypoxic respiratory failure due to COVID-19 lung fibrosis: Appears to have had progressive hypoxia with increasing oxygen requirement since October admission for COVID-19.  His point of care COVID-19 testing has resulted negative this time, PCR pending.  He is currently requiring 100% NRB M and ABG is pending.  Discussed with Dr. Shearon Stalls, PCCM regarding admission-she advised of poor prognosis and extensive discussions as outpatient which family seemed to understand well.  She agreed with IV Solu-Medrol, MDI inhalers and supportive management for now.  No indication for antibiotics.  Leukocytosis likely from recent outpatient steroid use.  Recent CT chest negative for PE.  Patient wants DNR status.  Understands overall poor prognosis.  Palliative care consulted.  Daughter appears to be leaning towards home hospice option if patient were to deteriorate further.  2.  Hypertension: Patient appears to be mildly hypotensive currently.  Reports poor oral intake.  Renal function okay.  Will admit with IV hydration and holding home medications.  3.  GERD: Resume PPI.  4.  Anxiety: Resume Xanax at bedtime.  5.  Remote history of smoking: States quit 50 years back.  Changed albuterol inhaler to Combivent after  discussing with pulmonary.  DVT prophylaxis: Lovenox  COVID screen: POC test negative, confirmatory test pending  Code Status: DNR   .Health care proxy would be his daughter [name]  Patient/Family Communication: Discussed with patient and all questions answered to satisfaction.  Consults called: PCCM, discussed with Dr. Shearon Stalls Admission status :I certify that at the point of admission it is my clinical judgment that the patient will require inpatient hospital care spanning beyond 2 midnights from the point of admission due to high intensity of service and high frequency of surveillance required.Inpatient status is judged to be reasonable and necessary in order to provide the required intensity of service to ensure the patient's safety. The patient's presenting symptoms, physical exam findings, and initial radiographic and laboratory data in the context of their chronic comorbidities is felt to place them at high risk for further clinical deterioration. The following factors support the patient status of inpatient : Acute severe hypoxic respiratory failure requiring IV steroids and 100% NRB M/O2 Expected LOS: 3 to 4 days    Guilford Shi MD [facility] Pager [phone]  If 7PM-7AM, please contact night-coverage [email or web address] Password [email or web address]  07/31/2019, 5:25 PM

## 2019-07-31 NOTE — Telephone Encounter (Signed)
Spoke with [name]. She stated that the patient's home health nurse called 911. Patient was sent to the ED at [facility]. She wanted to let Dr. Shearon Stalls know that she appreciates the suggestion and that she will follow up once the patient is discharged.

## 2019-07-31 NOTE — Consult Note (Signed)
Patient is known to pulmonary service - my clinic patient. Covid pneumonia with resultant fibrosis.  Discussed plan of care to increase steroids to IV BID and do prn nebs.  He is DNR which is appropriate.  Full consult note to follow tomorrow.

## 2019-07-31 NOTE — ED Notes (Signed)
[name] daughter [phone] looking for an update on the patient

## 2019-07-31 NOTE — Telephone Encounter (Signed)
Spoke with the pt's daughte, Alejandro Hicks  Pt with pulmonary fibrosis seen on 07/26/19 by Dr Shearon Stalls for consult: Assessment:  Alejandro Hicks is an 85 year old gentleman with a history of allergic rhinitis, possible asthma.  Presents with complications of hypoxemic respiratory failure and pulmonary fibrosis after COVID-19 infection.  1. COVID-19 pneumonia 2.  Acute on chronic hypoxemic respiratory failure 3.  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  Plan/Recommendations: Unfortunately Alejandro Hicks has had significant complications after his [id number] pneumonia.  I do believe that some of the earlier changes in October were likely steroid responsive given their dense consolidative pattern.  There has been some report of an organizing pneumonia pattern after COVID-19 pneumonia which is probably why he responded to prednisone .  However there are now changes of traction bronchiectasis, as well as lower lobe bronchiectasis which appears postinfectious in nature.  I am unclear how much this will now respond to steroids. We may do some repeat imaging at some point to see how much of this is residual.   I discussed with his daughter and the patient at some length about the likely chronic and irreversible nature of this fibrosis.  I do believe that the majority of our focus will be on symptom management.  I am referring him to pulmonary rehabilitation, as many post Covid patients with fibrosis have responded well to symptom management and dyspnea.  He should continue his oxygen, and can take ProAir as needed if it is helpful.   Truly feels that the Decadron even at a low dose is helping his appetite, and energy levels.  We discussed the pros and cons of long-term glucocorticoid therapy and at this point I decided that the benefits of energy and appetite will outweigh any mood or sleep changes, or long-term consequences of diabetes hypertension osteoporosis etc.   I have let him know that there is  increasing interest in starting antifibrotic therapy in patients with COVID-19 pneumonia fibrosis.  I will make him aware if there are any clinical trials or options for treatment beyond oxygen and rehab.  Return to Care: Return in about 3 months (around 10/24/2019).   Alejandro Hicks states pt worsened over the past 2 days with significant increase in SOB  He is having trouble eating due to SOB  She states that just him turning over in the bed makes his sats drop into the 70's while on 10 lpm o2  She states that he has a hard time breathing in through his nose and his chest quivers when he tries to breathe  She tried to call 911 and have him taken to ED but he refuses this and does not want to go to the hospital  She states that he is not having any other new symptoms, no fever, chills, body aches- just increased SOB  She is asking if we can send an order to DME for a face mask for o2 since he struggles with the [place] Please advise thanks!

## 2019-07-31 NOTE — ED Provider Notes (Signed)
[facility] EMERGENCY DEPARTMENT Provider Note   CSN: [id number] Arrival date & time: 07/31/19  1140     History   Chief Complaint Chief Complaint  Patient presents with  . Shortness of Breath    HPI Alejandro Hicks is a 85 y.o. male presenting for evaluation of shortness of breath.   Patient states that the past 2 days, he has become extremely short of breath.  Shortness breath is significantly worse with exertion, improved when he is lying on his side.  Patient was dislocated 2 months ago.  Since discharge, he has been on oxygen, initially at 1 L at night, now at 3 to 4 L as needed.  With possible days, patient has needed to increase his oxygen significantly, up to 10 L.  He denies fevers, chills, sore throat, cough, chest pain, nausea, vomiting, abdominal pain, urinary symptoms, abnormal bowel movements.  Patient states he is still on steroids, but has been weaned off.  He has a history of asthma, this feels different.  Additional history obtained from chart review.  Reviewed hospitalization for Covid.  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      HPI  Past Medical History:  Diagnosis Date  . Allergic rhinitis   . Anxiety   . Asthma   . BPH (benign prostatic hypertrophy)   . Colonic polyp   . Dyslipidemia   . ED (erectile dysfunction)   . GERD (gastroesophageal reflux disease)   . Inguinal hernia    Left S/P repair in 2012  . Leukoplakia of vocal cords   . OA (osteoarthritis)   . OSA (obstructive sleep apnea)    Does not use CPAP  . Reflux laryngitis    ENT ecal, PPIs twice a day  . Rosacea     Patient Active Problem List   Diagnosis Date Noted  . COVID-19   . Acute respiratory failure with hypoxia (HCC)   . Pneumonia due to COVID-19 virus 06/09/2019  . GERD (gastroesophageal reflux disease) 06/09/2019  . Acute hypoxemic respiratory failure due to  COVID-19 (HCC) 06/09/2019  . Elevated liver enzymes 06/09/2019  . Pneumonia due to 2019-nCoV 05/28/2019  . Primary osteoarthritis of right hip 12/13/2018  . Plantar fasciitis, right 02/16/2018  . Primary osteoarthritis of right ankle 01/19/2018  . Shoulder impingement syndrome, left 10/01/2017  . Rib pain on right side 12/09/2015  . Right shoulder pain with history of right proximal biceps rupture 12/09/2015  . Essential hypertension, benign 10/21/2015  . Lumbar degenerative disc disease 09/02/2015  . DDD (degenerative disc disease), cervical 08/05/2015    Past Surgical History:  Procedure Laterality Date  . COLONOSCOPY  2009  . INGUINAL HERNIA REPAIR Left    2012        Home Medications    Prior to Admission medications   Medication Sig Start Date End Date Taking? Authorizing Provider  acetaminophen (TYLENOL) 500 MG tablet Take 1,000 mg by mouth every 6 (six) hours as needed for headache (pain).    [provider]  albuterol (PROAIR HFA) 108 (90 Base) MCG/ACT inhaler Use 2 puffs every 4 hours as needed for cough and wheeze. May use 10-20 minutes before exercise Patient taking differently: Inhale 2 puffs into the lungs every 4 (four) hours as needed for wheezing (cough or 10-20 minutes before exercise).  02/09/17   Kozlow, Alvira Philips, MD  ALPRAZolam Prudy Feeler) 0.25 MG tablet Take 1 tablet (0.25  mg total) by mouth at bedtime. 07/14/19   Hongalgi, Lenis Dickinson, MD  Ascorbic Acid (VITAMIN C) 1000 MG tablet Take 1,000 mg by mouth 2 (two) times daily.     [provider]  aspirin EC 81 MG tablet Take 81 mg by mouth daily.    [provider]  Cholecalciferol (VITAMIN D-3) 125 MCG (5000 UT) TABS Take 5,000 Units by mouth every morning.    [provider]  dexamethasone (DECADRON) 1 MG tablet Take 5 tabs (5 mg total) daily for 4 days, then 4 tabs (4 mg total) daily for 4 days, then 3 tabs (3 mg total) daily for 4 days, then 2 tabs (2 mg total) daily for 4 days, then  1 tab (1 mg total) daily for 4 days, then stop. 07/14/19   Hongalgi, Lenis Dickinson, MD  dexamethasone (DECADRON) 1 MG tablet Take 1 tablet (1 mg total) by mouth daily. 07/26/19   Spero Geralds, MD  DEXILANT 60 MG capsule TAKE 1 CAPSULE (60 MG TOTAL) BY MOUTH EVERY MORNING. Patient taking differently: Take 60 mg by mouth every morning.  12/23/17   Kozlow, Donnamarie Poag, MD  dextromethorphan (DELSYM) 30 MG/5ML liquid Take 60 mg by mouth 2 (two) times daily as needed for cough.    [provider]  fluticasone (FLONASE) 50 MCG/ACT nasal spray Place 1 spray into both nostrils daily.    [provider]  guaiFENesin-dextromethorphan (ROBITUSSIN DM) 100-10 MG/5ML syrup Take 10 mLs by mouth every 6 (six) hours as needed for cough. 06/06/19   Thurnell Lose, MD  ibuprofen (ADVIL) 200 MG tablet Take 400 mg by mouth every 6 (six) hours as needed for headache (pain).    [provider]  Menaquinone-7 (VITAMIN K2 PO) Take 1 tablet by mouth every morning.     [provider]  OXYGEN Inhale 3 L/min into the lungs See admin instructions. Continuous - 3L at rest/ 6 L with exertion    [provider]  Spacer/Aero-Holding Chambers (AEROCHAMBER PLUS FLO-VU LARGE) MISC 1 each by Other route once. 11/11/15   Gean Quint, MD  testosterone cypionate (DEPOTESTOSTERONE CYPIONATE) 200 MG/ML injection Inject 60 mg into the muscle once a week. 0.3 ml - 60 mg (inject into lateral thigh or buttock) 01/26/17   [provider]  valsartan-hydrochlorothiazide (DIOVAN-HCT) 320-25 MG tablet Take 0.5 tablets by mouth at bedtime. 07/14/19   Modena Jansky, MD    Family History Family History  Problem Relation Age of Onset  . Emphysema Father   . Asthma Neg Hx     Social History Social History   Tobacco Use  . Smoking status: Former Smoker    Packs/day: 0.75    Years: 10.00    Pack years: 7.50    Types: Cigarettes    Quit date: 1970    Years since quitting: 50.9  . Smokeless  tobacco: Never Used  Substance Use Topics  . Alcohol use: Yes    Comment: social  . Drug use: No     Allergies   Lipitor [atorvastatin] and Zocor [simvastatin]   Review of Systems Review of Systems  Respiratory: Positive for shortness of breath.   All other systems reviewed and are negative.    Physical Exam Updated Vital Signs BP 94/80   Pulse 74   Temp 98.2 F (36.8 C) (Oral)   Resp (!) 33   Ht 5\' 6"  (1.676 m)   Wt 66.2 kg   SpO2 97%   BMI 23.57  kg/m   Physical Exam Vitals signs and nursing note reviewed.  Constitutional:      Comments: Elderly male with slight increased work of breathing  HENT:     Head: Normocephalic and atraumatic.  Eyes:     Conjunctiva/sclera: Conjunctivae normal.     Pupils: Pupils are equal, round, and reactive to light.  Neck:     Musculoskeletal: Normal range of motion and neck supple.  Cardiovascular:     Rate and Rhythm: Normal rate and regular rhythm.     Pulses: Normal pulses.  Pulmonary:     Effort: Tachypnea and accessory muscle usage present.     Comments: Slight increased work of breathing/extensor muscle use.  Tachypneic around 30. Crackles in bilateral bases, decreased lung sounds throughout  Abdominal:     General: There is no distension.     Palpations: Abdomen is soft. There is no mass.     Tenderness: There is no abdominal tenderness. There is no guarding or rebound.  Musculoskeletal: Normal range of motion.     Right lower leg: No edema.     Left lower leg: No edema.     Comments: No leg pain or swelling  Skin:    General: Skin is warm and dry.     Capillary Refill: Capillary refill takes less than 2 seconds.  Neurological:     Mental Status: He is alert and oriented to person, place, and time.      ED Treatments / Results  Labs (all labs ordered are listed, but only abnormal results are displayed) Labs Reviewed  COMPREHENSIVE METABOLIC PANEL - Abnormal; Notable for the following components:      Result  Value   Glucose, Bld 117 (*)    BUN 26 (*)    Albumin 2.9 (*)    Total Bilirubin 1.5 (*)    All other components within normal limits  CBC WITH DIFFERENTIAL/PLATELET - Abnormal; Notable for the following components:   WBC 12.8 (*)    RDW 16.1 (*)    Neutro Abs 10.7 (*)    Abs Immature Granulocytes 0.10 (*)    All other components within normal limits  CULTURE, BLOOD (ROUTINE X 2)  CULTURE, BLOOD (ROUTINE X 2)  LACTIC ACID, PLASMA  PROTIME-INR  LACTIC ACID, PLASMA  URINALYSIS, ROUTINE W REFLEX MICROSCOPIC  I-STAT ARTERIAL BLOOD GAS, ED  POC SARS CORONAVIRUS 2 AG -  ED    EKG None  Radiology Dg Chest Portable 1 View  Result Date: 07/31/2019 CLINICAL DATA:  Tachypnea.  COVID positive. EXAM: PORTABLE CHEST 1 VIEW COMPARISON:  CT 07/13/2019.  Chest x-ray 07/13/2019. FINDINGS: Mediastinum and hilar structures normal. Diffuse bilateral pulmonary infiltrates again noted without significant change. No pleural effusion or pneumothorax. Degenerative change thoracic spine. IMPRESSION: Diffuse bilateral pulmonary infiltrates again noted without interim change in this known COVID 19 positive patient. Electronically Signed   By: Maisie Fus  Register   On: 07/31/2019 13:06    Procedures .Critical Care Performed by: Alveria Apley, PA-C Authorized by: Alveria Apley, PA-C   Critical care provider statement:    Critical care time (minutes):  45   Critical care time was exclusive of:  Separately billable procedures and treating other patients and teaching time   Critical care was necessary to treat or prevent imminent or life-threatening deterioration of the following conditions:  Respiratory failure   Critical care was time spent personally by me on the following activities:  Blood draw for specimens, development of treatment plan with patient or surrogate,  evaluation of patient's response to treatment, examination of patient, obtaining history from patient or surrogate, ordering and  performing treatments and interventions, ordering and review of laboratory studies, ordering and review of radiographic studies, re-evaluation of patient's condition, pulse oximetry and review of old charts   I assumed direction of critical care for this patient from another provider in my specialty: no   Comments:     Patient with respiratory decline, requiring 10 L of oxygen.  Requiring admission to the hospital for further evaluation management of his respiratory failure.   (including critical care time)  Medications Ordered in ED Medications - No data to display   Initial Impression / Assessment and Plan / ED Course  I have reviewed the triage vital signs and the nursing notes.  Pertinent labs & imaging results that were available during my care of the patient were reviewed by me and considered in my medical decision making (see chart for details).        Patient presenting for evaluation of shortness of breath.  Physical exam concerning, patient is tachypneic and requiring 10 L of oxygen.  Concern for secondary infection, worsening pulmonary fibrosis.  Consider PE, though patient is without chest pain, hypertension, or tachycardia.  Will obtain labs, x-ray, and EKG, but patient likely need to be admitted for further evaluation of his respiratory status.  Labs show slight leukocytosis at 13.  X-ray read interpreted by me, shows bilateral infiltrates which radiology is stating is similar to previous.   Plan by RN that patient had an episode in which his oxygen came off.  He was satting at 64% on room air.  This improved when placed back on high flow oxygen.  Will call for admission.  [facility] pt tp [facility]. Requesting PE study, abg and repeat covid testing.   I discussed plan with pt and daughter, both of whom are agreeable.   Final Clinical Impressions(s) / ED Diagnoses   Final diagnoses:  Acute on chronic respiratory failure, unspecified whether  with hypoxia or hypercapnia [facility](HCC)    ED Discharge Orders    None       Alveria ApleyCaccavale, Sarkis Rhines, PA-C 07/31/19 1555    Melene PlanFloyd, Dan, DO 08/01/19 [phone]

## 2019-07-31 NOTE — Telephone Encounter (Signed)
See above -home health nurse on this patient called 911.  Just wanted to send this to you as an FYI.  Patient was routed to [facility] per this note.Aaron Edelman

## 2019-07-31 NOTE — Telephone Encounter (Signed)
Okay to send order for facemask to DME.  I will also route this message to Dr. Shearon Stalls as Juluis Rainier.  This patient's oxygen saturations are dropping in the 70s on 10 L since she just recently saw the patient.  To see if she has any other additional thoughts.  Wyn Quaker, FNP

## 2019-07-31 NOTE — Telephone Encounter (Signed)
Spoke with the pt's daughter and notified that I have sent order for the facemask and that we will send the msg now to Dr Shearon Stalls per Brian's request for additional recs

## 2019-08-01 ENCOUNTER — Encounter (HOSPITAL_COMMUNITY): Payer: Self-pay | Admitting: Nurse Practitioner

## 2019-08-01 DIAGNOSIS — Z7189 Other specified counseling: Secondary | ICD-10-CM

## 2019-08-01 DIAGNOSIS — J9620 Acute and chronic respiratory failure, unspecified whether with hypoxia or hypercapnia: Secondary | ICD-10-CM | POA: Diagnosis not present

## 2019-08-01 DIAGNOSIS — U071 COVID-19: Secondary | ICD-10-CM | POA: Diagnosis not present

## 2019-08-01 DIAGNOSIS — J9621 Acute and chronic respiratory failure with hypoxia: Secondary | ICD-10-CM

## 2019-08-01 DIAGNOSIS — J9622 Acute and chronic respiratory failure with hypercapnia: Secondary | ICD-10-CM

## 2019-08-01 DIAGNOSIS — J9601 Acute respiratory failure with hypoxia: Secondary | ICD-10-CM

## 2019-08-01 DIAGNOSIS — J8410 Pulmonary fibrosis, unspecified: Principal | ICD-10-CM

## 2019-08-01 DIAGNOSIS — Z66 Do not resuscitate: Secondary | ICD-10-CM

## 2019-08-01 DIAGNOSIS — J8489 Other specified interstitial pulmonary diseases: Secondary | ICD-10-CM

## 2019-08-01 DIAGNOSIS — J9690 Respiratory failure, unspecified, unspecified whether with hypoxia or hypercapnia: Secondary | ICD-10-CM | POA: Diagnosis present

## 2019-08-01 DIAGNOSIS — Z515 Encounter for palliative care: Secondary | ICD-10-CM

## 2019-08-01 HISTORY — DX: Do not resuscitate: Z66

## 2019-08-01 LAB — CBC WITH DIFFERENTIAL/PLATELET
Abs Immature Granulocytes: 0.05 10*3/uL (ref 0.00–0.07)
Basophils Absolute: 0 10*3/uL (ref 0.0–0.1)
Basophils Relative: 0 %
Eosinophils Absolute: 0 10*3/uL (ref 0.0–0.5)
Eosinophils Relative: 0 %
HCT: 43.6 % (ref 39.0–52.0)
Hemoglobin: 14.4 g/dL (ref 13.0–17.0)
Immature Granulocytes: 1 %
Lymphocytes Relative: 5 %
Lymphs Abs: 0.5 10*3/uL — ABNORMAL LOW (ref 0.7–4.0)
MCH: 29.7 pg (ref 26.0–34.0)
MCHC: 33 g/dL (ref 30.0–36.0)
MCV: 89.9 fL (ref 80.0–100.0)
Monocytes Absolute: 0.3 10*3/uL (ref 0.1–1.0)
Monocytes Relative: 3 %
Neutro Abs: 8.9 10*3/uL — ABNORMAL HIGH (ref 1.7–7.7)
Neutrophils Relative %: 91 %
Platelets: 146 10*3/uL — ABNORMAL LOW (ref 150–400)
RBC: 4.85 MIL/uL (ref 4.22–5.81)
RDW: 15.1 % (ref 11.5–15.5)
WBC: 9.7 10*3/uL (ref 4.0–10.5)
nRBC: 0 % (ref 0.0–0.2)

## 2019-08-01 LAB — CBC
HCT: 46.1 % (ref 39.0–52.0)
Hemoglobin: 14.9 g/dL (ref 13.0–17.0)
MCH: 29.4 pg (ref 26.0–34.0)
MCHC: 32.3 g/dL (ref 30.0–36.0)
MCV: 91.1 fL (ref 80.0–100.0)
Platelets: 138 10*3/uL — ABNORMAL LOW (ref 150–400)
RBC: 5.06 MIL/uL (ref 4.22–5.81)
RDW: 15.9 % — ABNORMAL HIGH (ref 11.5–15.5)
WBC: 11.3 10*3/uL — ABNORMAL HIGH (ref 4.0–10.5)
nRBC: 0 % (ref 0.0–0.2)

## 2019-08-01 LAB — BASIC METABOLIC PANEL
Anion gap: 10 (ref 5–15)
BUN: 35 mg/dL — ABNORMAL HIGH (ref 8–23)
CO2: 29 mmol/L (ref 22–32)
Calcium: 9.4 mg/dL (ref 8.9–10.3)
Chloride: 101 mmol/L (ref 98–111)
Creatinine, Ser: 0.87 mg/dL (ref 0.61–1.24)
GFR calc Af Amer: >60 mL/min (ref >60)
GFR calc non Af Amer: >60 mL/min (ref >60)
Glucose, Bld: 146 mg/dL — ABNORMAL HIGH (ref 70–99)
Potassium: 4.7 mmol/L (ref 3.5–5.1)
Sodium: 140 mmol/L (ref 135–145)

## 2019-08-01 LAB — SARS CORONAVIRUS 2 (TAT 6-24 HRS): SARS Coronavirus 2: NEGATIVE

## 2019-08-01 LAB — INFLUENZA PANEL BY PCR (TYPE A & B)
Influenza A By PCR: NEGATIVE
Influenza B By PCR: NEGATIVE

## 2019-08-01 LAB — BRAIN NATRIURETIC PEPTIDE: B Natriuretic Peptide: 40.4 pg/mL (ref 0.0–100.0)

## 2019-08-01 MED ORDER — MORPHINE SULFATE (CONCENTRATE) 10 MG/0.5ML PO SOLN
10.0000 mg | ORAL | Status: DC | PRN
  Administered 2019-08-01 – 2019-08-02 (×5): 10 mg via SUBLINGUAL
  Filled 2019-08-01 (×5): qty 0.5

## 2019-08-01 NOTE — ED Notes (Signed)
Pt moved to hospital bed.

## 2019-08-01 NOTE — ED Notes (Signed)
Pt declined a foley catheter and stated that he no longer wants one. This nurse spoke with MD Regalado who stated that, that was fine.

## 2019-08-01 NOTE — Consult Note (Addendum)
NAME:  Alejandro Hicks, MRN:  [id number], DOB:  [birth year], LOS: 1 ADMISSION DATE:  07/31/2019, CONSULTATION DATE:  08/01/2019 REFERRING MD: Earnest Conroy CHIEF COMPLAINT:  Progressive dyspnea   Brief History   85 y.o. male with h/o asthma, HLD, GERD, OSA not on CPAP, HTN, anxiety, tobacco abuse s/p COVID 19 pneumonia admitted with progressive dyspnea and hypoxia.  History of present illness   85 y.o. male with h/o asthma, HLD, GERD, OSA not on CPAP, HTN, anxiety, tobacco abuse s/p COVID 19 pneumonia He contracted COVID-19 infection /p golf trip in September.  He was hospitalized at [facility] and treated with IV remdesivir and steroids.  Was discharged home with oxygen, and represented only 3 days later with worsening dyspnea.  Admitted for second time to [facility] and this time treated for a superimposed bacterial infection empirically.  Was again discharged home on 10/29, and represented on 11/19 with dyspnea.  He has been treated with steroids once with prednisone then Decadron taper. He was discharged 11/20 on home O2 3Lpm to be titrated per home pulse ox along with decadron taper as mentioned. 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   On 12/7 he presented to the ED with dyspnea and increasing oxygen requirements up to 10 L O2.  According to patient and daughter, he was requiring 6 to 8 L of O2 with minimal exertion like going to the bathroom even upon last discharge.  Over the last 3 to 4 days he became progressively dyspneic.  Daughter bumped up his 0 2 to 8 L, he was still satting only at 90% subsequent to which he increased his O2 level to 10 L and brought him to the ED upon MD's advice.   Per Dr Mauricio Po note from 07/26/2019 "Unfortunately Mr. 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  I do believe that some of the earlier changes in October were  likely steroid responsive given their dense consolidative pattern.  There has been some report of an organizing pneumonia pattern after COVID-19 pneumonia which is probably why he responded to prednisone .  However there are now changes of traction bronchiectasis, as well as lower lobe bronchiectasis which appears postinfectious in nature.  I am unclear how much this will now respond to steroids. We may do some repeat imaging at some point to see how much of this is residual.   I discussed with his daughter and the patient at some length about the likely chronic and irreversible nature of this fibrosis.  I do believe that the majority of our focus will be on symptom management.  I am referring him to pulmonary rehabilitation, as many post Covid patients with fibrosis have responded well to symptom management and dyspnea.  He should continue his oxygen, and can take ProAir as needed if it is helpful.   Truly feels that the Decadron even at a low dose is helping his appetite, and energy levels.  We discussed the pros and cons of long-term glucocorticoid therapy and at this point I decided that the benefits of energy and appetite will outweigh any mood or sleep changes, or long-term consequences of diabetes hypertension osteoporosis etc.   I have let him know that there is increasing interest in starting antifibrotic therapy in patients with COVID-19 pneumonia fibrosis.  I will make him aware if there are any clinical trials or options for treatment beyond oxygen and rehab."  Past Medical History   Past Medical History:  Diagnosis Date  . Allergic rhinitis   . Anxiety   . Asthma   . BPH (benign prostatic hypertrophy)   . Colonic polyp   . Dyslipidemia   . ED (erectile dysfunction)   . GERD (gastroesophageal reflux disease)   . Inguinal hernia    Left S/P repair in 2012  . Leukoplakia of vocal cords   . OA (osteoarthritis)   . OSA (obstructive sleep apnea)    Does not use CPAP  . Reflux  laryngitis    ENT ecal, PPIs twice a day  . Rosacea      Significant Hospital Events   12/7 Admitted  Consults:  PCCM   Procedures:  NA  Significant Diagnostic Tests:  CT Angio Chest 12/7 IMPRESSION: 1. Negative for pulmonary embolus. 2. Interval worsening of the bilateral mixed ground-glass opacity and consolidation on a background of architectural distortion, most notably in the right upper lobe, left upper lobe and superior segment left lower lobe. Appearance is worrisome for a recrudescent infection or superinfection on a background of architectural distortion and fibrosis and low lung volumes. 3. Reflux of contrast to the IVC may reflect elevated right heart pressures, possibly due to the underlying parenchymal disease. 4. Trace left effusion. 5. Coronary artery and aortic atherosclerosis.   Micro Data:  12/7: SARS Coronavirus 2 negative   Antimicrobials:  NA   Interim history/subjective:  Resting quietly. On 15lpm NRB. SpO2 98%. No distress. Denies SOB at rest. Has not been out of bed.   Objective   Blood pressure 100/60, pulse 82, temperature 98.2 F (36.8 C), temperature source Oral, resp. rate (!) 39, height  (1.676 m), weight 66.2 kg, SpO2 97 %.        Intake/Output Summary (Last 24 hours) at 08/01/2019 1058 Last data filed at 08/01/2019 0058 Gross per 24 hour  Intake -  Output 500 ml  Net -500 ml   Filed Weights   07/31/19 1149  Weight: 66.2 kg    Examination: General: Frail, elderly gentleman, well-developed. Lying on R side. NAD HENT: Normocephalic. Moist mucus membranes Neck: No JVD. Trachea midline. No thyromegaly, no lymphadenopathy CV: RRR. S1S2. No MRG. +2 distal pulses Lungs: BBS diminished at bases. FNL, symmetrical ABD: +BS x4. SNT/ND. No masses, guarding or rigidity GU: No Foley EXT: MAE well. No edema Skin: PWD. In tact. No rashes or lesions Neuro: A&Ox3. CN II-XII in tact. 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  Wean O2 for SpO2 >/=88% and/or comfort level/WOB Increase solu medrol to  IV BID. Prn nebs. Agree with DNR.  Agree with palliative care/Hospice  Best practice:per primary     Labs   CBC: Recent Labs  Lab 07/31/19 1225 07/31/19 1751 08/01/19 0400  WBC 12.8*  --  11.3*  NEUTROABS 10.7*  --   --   HGB 15.8 14.6 14.9  HCT 49.2 43.0 46.1  MCV 91.4  --  91.1  PLT 159  --  138*    Basic Metabolic Panel: Recent Labs  Lab 07/31/19 1225 07/31/19 1751 08/01/19 0400  NA 139 136 140  K 4.7 4.9 4.7  CL 98  --  101  CO2 31  --  29  GLUCOSE 117*  --  146*  BUN 26*  --  35*  CREATININE 0.86  --  0.87  CALCIUM 9.4  --  9.4   GFR: Estimated Creatinine Clearance: 58.1 mL/min (by C-G formula based on SCr of 0.87 mg/dL). Recent Labs  Lab 07/31/19 1225 07/31/19 1240 07/31/19 1621 08/01/19 0400  WBC 12.8*  --   --  11.3*  LATICACIDVEN  --  1.3 1.5  --     Liver Function Tests: Recent Labs  Lab 07/31/19 1225  AST 38  ALT 35  ALKPHOS 53  BILITOT 1.5*  PROT 6.6  ALBUMIN 2.9*   No results for input(s): LIPASE, AMYLASE in the last 168 hours. No results for input(s): AMMONIA in the last 168 hours.  ABG    Component Value Date/Time   PHART 7.401 07/31/2019 1751   PCO2ART 52.7 (H) 07/31/2019 1751   PO2ART 221.0 (H) 07/31/2019 1751   HCO3 32.8 (H) 07/31/2019 1751   TCO2 34 (H) 07/31/2019 1751   O2SAT 100.0 07/31/2019 1751     Coagulation Profile: Recent Labs  Lab 07/31/19 1225  INR 1.0    Cardiac Enzymes: No results for input(s): CKTOTAL, CKMB, CKMBINDEX, TROPONINI in the last 168 hours.  HbA1C: No results found for: HGBA1C  CBG: No results for input(s): GLUCAP in the last 168 hours.  Review of Systems: positives in bold   Gen: Denies  fever, chills, weight change, fatigue, night sweats HEENT: Denies blurred vision, double vision, hearing loss, tinnitus, sinus congestion, rhinorrhea, sore throat, neck stiffness, dysphagia PULM: Denies shortness of breath, cough, sputum production, hemoptysis, wheezing, dyspnea on exertion  CV: Denies chest pain, edema, orthopnea, paroxysmal nocturnal dyspnea, palpitations GI: Denies abdominal pain, nausea, vomiting, diarrhea, hematochezia, melena, constipation, change in bowel habits GU: Denies dysuria, hematuria, polyuria, oliguria, urethral discharge Endocrine: Denies hot or cold intolerance, polyuria, polyphagia or appetite change Derm: Denies rash, dry skin, scaling or peeling skin change Heme: Denies easy bruising, bleeding, bleeding gums Neuro: Denies headache, numbness, weakness, slurred speech, loss of memory or consciousness   Past Medical History  He,  has a past medical history of Allergic rhinitis, Anxiety, Asthma, BPH (benign prostatic hypertrophy), Colonic polyp, Dyslipidemia, ED (erectile dysfunction), GERD (gastroesophageal reflux disease), Inguinal hernia, Leukoplakia of vocal cords, OA (osteoarthritis), OSA (obstructive sleep apnea), Reflux laryngitis, and Rosacea.   Surgical History    Past Surgical History:  Procedure Laterality Date  . COLONOSCOPY  2009  . INGUINAL HERNIA REPAIR Left    2012     Social History   reports that he quit smoking about 50 years ago. His smoking use included cigarettes. He has a 7.50 pack-year smoking history. He has never used smokeless tobacco. He reports current alcohol use. He reports that he does not use drugs.   Family History   His family history includes Emphysema in his father. There is no history of Asthma.   Allergies Allergies  Allergen Reactions  . Lipitor [Atorvastatin] Other (See Comments)    Myalgia  . Zocor [Simvastatin] Other (See Comments)    Myalgia     Home Medications  Prior to Admission medications    Medication Sig Start Date End Date Taking? Authorizing Provider  acetaminophen (TYLENOL) 500 MG tablet Take 1,000 mg by mouth every 6 (six) hours as needed for headache (pain).   Yes [provider]  albuterol (PROAIR HFA) 108 (90 Base) MCG/ACT inhaler Use 2 puffs every 4 hours as needed for cough and wheeze. May use 10-20 minutes before exercise Patient taking differently: Inhale  2 puffs into the lungs every 4 (four) hours as needed for wheezing (cough or 10-20 minutes before exercise).  02/09/17  Yes Kozlow, Alvira PhilipsEric J, MD  ALPRAZolam Prudy Feeler(XANAX) 0.25 MG tablet Take 1 tablet (0.25 mg total) by mouth at bedtime. 07/14/19  Yes Hongalgi, Maximino GreenlandAnand D, MD  Ascorbic Acid (VITAMIN C) 1000 MG tablet Take 1,000 mg by mouth 2 (two) times daily.    Yes [provider]  aspirin EC 81 MG tablet Take 81 mg by mouth daily.   Yes [provider]  Cholecalciferol (VITAMIN D3) 50 MCG (2000 UT) capsule Take 2,000 Units by mouth daily.   Yes [provider]  dexamethasone (DECADRON) 1 MG tablet Take 1 tablet (1 mg total) by mouth daily. 07/26/19  Yes Charlott Holleresai, Nikita S, MD  DEXILANT 60 MG capsule TAKE 1 CAPSULE (60 MG TOTAL) BY MOUTH EVERY MORNING. Patient taking differently: Take 60 mg by mouth every morning.  12/23/17  Yes Kozlow, Alvira PhilipsEric J, MD  fluticasone (FLONASE) 50 MCG/ACT nasal spray Place 1 spray into both nostrils daily as needed for allergies or rhinitis.    Yes [provider]  guaiFENesin-dextromethorphan (ROBITUSSIN DM) 100-10 MG/5ML syrup Take 10 mLs by mouth every 6 (six) hours as needed for cough. 06/06/19  Yes Leroy SeaSingh, Prashant K, MD  liver oil-zinc oxide (DESITIN) 40 % ointment Apply 1 application topically as needed for irritation.   Yes [provider]  Menaquinone-7 (VITAMIN K2 PO) Take 1 tablet by mouth every morning.    Yes [provider]  mineral oil-hydrophilic petrolatum (AQUAPHOR) ointment Apply 1 application topically as needed for dry skin or  irritation.   Yes [provider]  OXYGEN Inhale 3 L/min into the lungs See admin instructions. Continuous - 3L at rest/ 6 L with exertion   Yes [provider]  simethicone (GAS-X) 80 MG chewable tablet Chew 80 mg by mouth every 6 (six) hours as needed for flatulence.   Yes [provider]  testosterone cypionate (DEPOTESTOSTERONE CYPIONATE) 200 MG/ML injection Inject 60 mg into the muscle once a week. 0.3 ml - 60 mg (inject into lateral thigh or buttock) 01/26/17  Yes [provider]  valsartan-hydrochlorothiazide (DIOVAN-HCT) 320-25 MG tablet Take 0.5 tablets by mouth at bedtime. 07/14/19  Yes Hongalgi, Maximino GreenlandAnand D, MD  dexamethasone (DECADRON) 1 MG tablet Take 5 tabs (5 mg total) daily for 4 days, then 4 tabs (4 mg total) daily for 4 days, then 3 tabs (3 mg total) daily for 4 days, then 2 tabs (2 mg total) daily for 4 days, then 1 tab (1 mg total) daily for 4 days, then stop. Patient not taking: Reported on 08/01/2019 07/14/19   Elease EtienneHongalgi, Anand D, MD  Spacer/Aero-Holding Chambers (AEROCHAMBER PLUS FLO-VU LARGE) MISC 1 each by Other route once. Patient not taking: Reported on 08/01/2019 11/11/15   Baxter HireHicks, Roselyn M, MD    Karin Lieuhonda Sokhna Christoph, MSN, AGACNP  [facility]

## 2019-08-01 NOTE — ED Notes (Signed)
SDU  Breakfast ordered  

## 2019-08-01 NOTE — Consult Note (Addendum)
Consultation Note Date: 08/01/2019   Patient Name: Alejandro Hicks  DOB: 03-13-1934  MRN: [id number]  Age / Sex: 85 y.o., male  PCP: Wenda Low, MD Referring Physician: Elmarie Shiley, MD  Reason for Consultation: Disposition, Establishing goals of care and Non pain symptom management  HPI/Patient Profile: 85 y.o. male  with past medical history of COVID + pneumonia with residual pulmonary fibrosis, OSA, GERD, BPH, asthma, anxiety admitted on 07/31/2019 with increasing SOB. He has had several admissions related to his COVID pneumonia in the last two months. 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 Palliative medicine consulted for GOC, symptom management.   Clinical Assessment and Goals of Care: I reviewed patient's chart, labs, scans and met with patient at bedside.  Alejandro Hicks tells me his body is telling him he "does not have much time left". His GOC are to have "a few more weeks" at home, be able to meet with his family, and to die comfortably in his home. He describes [place]. He tells me, "there's no place to die like home".  We discussed the possibility of trying to stabilize him symptomatically to a point where he could discharge home with Hospice- he very much would like to do this.  I called his daughter as well- relayed to her my above discussion with her Dad. She tells me that since his last admission he has been pretty much bedbound except for a wheelchair to get to the bathroom. When his steroids were tapered he lost his appetite, only wanting to eat sweets. He was frequently requiring up titration of his O2. She is also agreeable to trying to take her Dad home with Hospice.  Hospice services were reviewed with Alejandro Hicks.  Symptom management was discussed- we discussed using morphine for SOB, air hunger. Discussed  that will start with low dose- but may need escalated doses as his illness progresses. Alejandro Hicks and Alejandro Hicks are aware there may come a time when the doses required for comfort will make Alejandro Hicks sleepy.   Primary Decision Maker PATIENT    SUMMARY OF RECOMMENDATIONS -Morphine concentrate 39m SL q1hr for SOB, air hunger -Refer for hospice services at home when discharged -[place] stabilize O2 to point where he can discharge home with hospice and avoid further hospitalizations -Foley catheter for comfort- TBaxter Flatterynotes that patient has requested catheter as simply the act of using a urinal causes him to struggle breathing  Code Status/Advance Care Planning:  DNR  Additional Recommendations (Limitations, Scope, Preferences):  Avoid Hospitalization  Prognosis:    < 4 weeks due to significant and progressing pulmonary fibrosis s/p COVID   Discharge Planning: Home with Hospice  Primary Diagnoses: Present on Admission: . [place] with [place] ([facility] [place] . COVID-19 . Lumbar degenerative disc disease . GERD (gastroesophageal reflux disease) . Essential hypertension, benign . Respiratory failure ([facility]   I have reviewed the medical record, interviewed the patient and family, and examined the  patient. The following aspects are pertinent.  Past Medical History:  Diagnosis Date  . Allergic rhinitis   . Anxiety   . Asthma   . BPH (benign prostatic hypertrophy)   . Colonic polyp   . Dyslipidemia   . ED (erectile dysfunction)   . GERD (gastroesophageal reflux disease)   . Inguinal hernia    Left S/P repair in 2012  . Leukoplakia of vocal cords   . OA (osteoarthritis)   . OSA (obstructive sleep apnea)    Does not use CPAP  . Reflux laryngitis    ENT ecal, PPIs twice a day  . Rosacea    Social History   Socioeconomic History  . Marital status: Single    Spouse name: Not on file  . Number of children: Not on file  . Years of education: Not on file  . Highest  education level: Not on file  Occupational History  . Not on file  Social Needs  . Financial resource strain: Not on file  . Food insecurity    Worry: Not on file    Inability: Not on file  . Transportation needs    Medical: Not on file    Non-medical: Not on file  Tobacco Use  . Smoking status: Former Smoker    Packs/day: 0.75    Years: 10.00    Pack years: 7.50    Types: Cigarettes    Quit date: 1970    Years since quitting: 50.9  . Smokeless tobacco: Never Used  Substance and Sexual Activity  . Alcohol use: Yes    Comment: social  . Drug use: No  . Sexual activity: Not on file  Lifestyle  . Physical activity    Days per week: Not on file    Minutes per session: Not on file  . Stress: Not on file  Relationships  . Social connections    Talks on phone: Not on file    Gets together: Not on file    Attends religious service: Not on file    Active member of club or organization: Not on file    Attends meetings of clubs or organizations: Not on file    Relationship status: Not on file  Other Topics Concern  . Not on file  Social History Narrative  . Not on file   Family History  Problem Relation Age of Onset  . Emphysema Father   . Asthma Neg Hx    Scheduled Meds: . ALPRAZolam  0.25 mg Oral QHS  . aspirin EC  81 mg Oral Daily  . cholecalciferol  5,000 Units Oral q morning - 10a  . enoxaparin (LOVENOX) injection  40 mg Subcutaneous Q24H  . fluticasone  1 spray Each Nare Daily  . guaiFENesin  600 mg Oral BID  . ipratropium-albuterol  3 mL Nebulization Q6H  . methylPREDNISolone (SOLU-MEDROL) injection  40 mg Intravenous Q12H  . pantoprazole  40 mg Oral Daily  . vitamin C  1,000 mg Oral BID   Continuous Infusions: PRN Meds:.acetaminophen, dextromethorphan, HYDROcodone-acetaminophen, morphine CONCENTRATE, ondansetron **OR** ondansetron (ZOFRAN) IV Medications Prior to Admission:  Prior to Admission medications   Medication Sig Start Date End Date Taking?  Authorizing Provider  acetaminophen (TYLENOL) 500 MG tablet Take 1,000 mg by mouth every 6 (six) hours as needed for headache (pain).   Yes [provider]  albuterol (PROAIR HFA) 108 (90 Base) MCG/ACT inhaler Use 2 puffs every 4 hours as needed for cough and wheeze. May use 10-20 minutes before   exercise Patient taking differently: Inhale 2 puffs into the lungs every 4 (four) hours as needed for wheezing (cough or 10-20 minutes before exercise).  02/09/17  Yes Kozlow, Eric J, MD  ALPRAZolam (XANAX) 0.25 MG tablet Take 1 tablet (0.25 mg total) by mouth at bedtime. 07/14/19  Yes Hongalgi, Anand D, MD  Ascorbic Acid (VITAMIN C) 1000 MG tablet Take 1,000 mg by mouth 2 (two) times daily.    Yes [provider]  aspirin EC 81 MG tablet Take 81 mg by mouth daily.   Yes [provider]  Cholecalciferol (VITAMIN D3) 50 MCG (2000 UT) capsule Take 2,000 Units by mouth daily.   Yes [provider]  dexamethasone (DECADRON) 1 MG tablet Take 1 tablet (1 mg total) by mouth daily. 07/26/19  Yes Desai, Nikita S, MD  DEXILANT 60 MG capsule TAKE 1 CAPSULE (60 MG TOTAL) BY MOUTH EVERY MORNING. Patient taking differently: Take 60 mg by mouth every morning.  12/23/17  Yes Kozlow, Eric J, MD  fluticasone (FLONASE) 50 MCG/ACT nasal spray Place 1 spray into both nostrils daily as needed for allergies or rhinitis.    Yes [provider]  guaiFENesin-dextromethorphan (ROBITUSSIN DM) 100-10 MG/5ML syrup Take 10 mLs by mouth every 6 (six) hours as needed for cough. 06/06/19  Yes Singh, Prashant K, MD  liver oil-zinc oxide (DESITIN) 40 % ointment Apply 1 application topically as needed for irritation.   Yes [provider]  Menaquinone-7 (VITAMIN K2 PO) Take 1 tablet by mouth every morning.    Yes [provider]  mineral oil-hydrophilic petrolatum (AQUAPHOR) ointment Apply 1 application topically as needed for dry skin or irritation.   Yes [provider]   OXYGEN Inhale 3 L/min into the lungs See admin instructions. Continuous - 3L at rest/ 6 L with exertion   Yes [provider]  simethicone (GAS-X) 80 MG chewable tablet Chew 80 mg by mouth every 6 (six) hours as needed for flatulence.   Yes [provider]  testosterone cypionate (DEPOTESTOSTERONE CYPIONATE) 200 MG/ML injection Inject 60 mg into the muscle once a week. 0.3 ml - 60 mg (inject into lateral thigh or buttock) 01/26/17  Yes [provider]  valsartan-hydrochlorothiazide (DIOVAN-HCT) 320-25 MG tablet Take 0.5 tablets by mouth at bedtime. 07/14/19  Yes Hongalgi, Anand D, MD  dexamethasone (DECADRON) 1 MG tablet Take 5 tabs (5 mg total) daily for 4 days, then 4 tabs (4 mg total) daily for 4 days, then 3 tabs (3 mg total) daily for 4 days, then 2 tabs (2 mg total) daily for 4 days, then 1 tab (1 mg total) daily for 4 days, then stop. Patient not taking: Reported on 08/01/2019 07/14/19   Hongalgi, Anand D, MD  Spacer/Aero-Holding Chambers (AEROCHAMBER PLUS FLO-VU LARGE) MISC 1 each by Other route once. Patient not taking: Reported on 08/01/2019 11/11/15   Hicks, Roselyn M, MD   Allergies  Allergen Reactions  . Lipitor [Atorvastatin] Other (See Comments)    Myalgia  . Zocor [Simvastatin] Other (See Comments)    Myalgia   Review of Systems  Constitutional: Positive for activity change, appetite change and fatigue.  Respiratory: Positive for shortness of breath.     Physical Exam Vitals signs and nursing note reviewed.  Pulmonary:     Effort: Tachypnea present.  Skin:    General: Skin is warm and dry.  Neurological:     Mental Status: He is alert and oriented to person, place, and time.  Psychiatric:          Mood and Affect: Mood normal.        Behavior: Behavior normal.     Vital Signs: BP 112/60   Pulse 63   Temp 98.2 F (36.8 C) (Oral)   Resp (!) 26   Ht 5' 6" (1.676 m)   Wt 66.2 kg   SpO2 96%   BMI 23.57 kg/m  Pain Scale: 0-10   Pain Score:  0-No pain   SpO2: SpO2: 96 % O2 Device:SpO2: 96 % O2 Flow Rate: .O2 Flow Rate (L/min): 15 L/min  IO: Intake/output summary:   Intake/Output Summary (Last 24 hours) at 08/01/2019 1206 Last data filed at 08/01/2019 0058 Gross per 24 hour  Intake -  Output 500 ml  Net -500 ml    LBM:   Baseline Weight: Weight: 66.2 kg Most recent weight: Weight: 66.2 kg     Palliative Assessment/Data: PPS: 20%     Thank you for this consult. Palliative medicine will continue to follow and assist as needed.   Time In: 1200 Time Out: 1315 Time Total: 75 mins Greater than 50%  of this time was spent counseling and coordinating care related to the above assessment and plan.  Signed by: Mariana Kaufman, AGNP-C Palliative Medicine    Please contact Palliative Medicine Team phone at [phone] for questions and concerns.  For individual provider: See Shea Evans

## 2019-08-01 NOTE — ED Notes (Signed)
Updated daughter on pt's status.  

## 2019-08-01 NOTE — ED Notes (Signed)
Lunch Tray Ordered @ 1042. 

## 2019-08-01 NOTE — Plan of Care (Signed)
  Problem: Education: Goal: Knowledge of General Education information will improve Description Including pain rating scale, medication(s)/side effects and non-pharmacologic comfort measures Outcome: Progressing   

## 2019-08-01 NOTE — Progress Notes (Addendum)
PROGRESS NOTE    Alejandro Hicks  ZOX:[id number] DOB: Sep 24, 1933 DOA: 07/31/2019 PCP: Georgann HousekeeperHusain, Karrar, MD   Brief Narrative: 85 year old with past medical history significant for asthma, hyperlipidemia, GERD, OSA not on CPAP, hypertension, tobacco abuse, recent 3 hospitalization 10/3-10/13 for Covid pneumonia treated with Remdesivir and systemic steroids, readmitted 10/16-10/29 for acute on chronic hypoxic respiratory failure treated for suspected pneumonia complicating recent COVID-19 pneumonia with ceftriaxone and doxycycline discharged to SNF on tapering steroid dose.  Readmitted 07/13/2019 due to progressive dyspnea, acute on chronic hypoxic respiratory failure treated with Solu-Medrol, doxycycline and discharged home on 07/14/2019.  Presents to the emergency department on 12/7 with worsening acute hypoxic respiratory failure requiring 10 L of oxygen.  Patient reports worsening shortness of breath over the last 3 4 days. Patient was placed on the nonrebreather in the ED, he will be admitted for acute on chronic hypoxic respiratory failure secondary to pulmonary fibrosis secondary to cough with 19 virus.  CCM was consulted and recommend increased dose of IV Solu-Medrol, palliative care consultation, recommendation is for patient to be discharged home with hospice when stable.  Assessment & Plan:   Principal Problem:   Acute respiratory failure with hypoxia (HCC) Active Problems:   Lumbar degenerative disc disease   Essential hypertension, benign   GERD (gastroesophageal reflux disease)   COVID-19   Respiratory failure (HCC)   DNR (do not resuscitate)  1-Acute on Chronic Hypoxic Respiratory Failure secondary to pulmonary fibrosis secondary to COVID-19 infection: Continue with IV Solu-Medrol 40 mg IV twice daily Will defer antibiotics to pulmonologist. Palliative care consulted, plan for home with hospice when patient is stabilized Morphine as needed for shortness of breath and increased  work of breathing.\ BNP at 40. Influenza panel negative. Continue with Duoneb.   2-HTN; SBP soft. Hold Diuretics.   3-GERD; Continue with PPI.  4-Anxiety; Resume xanax.   5-Mild thrombocytopenia. Monitor.   Estimated body mass index is 23.57 kg/m as calculated from the following:   Height as of this encounter: 5\' 6"  (1.676 m).   Weight as of this encounter: 66.2 kg.   DVT prophylaxis: Lovenox Code Status: DNR Family Communication: care discussed with patient, palliative care discussed with Daughter Disposition Plan: Remain in the hospital for treatment of respiratory failure Consultants:   CCM/Pulmonary  Palliative care.   Procedures:   none  Antimicrobials:  none  Subjective: He report SOB, cough.  Denies LE edema  Objective: Vitals:   08/01/19 1356 08/01/19 1400 08/01/19 1518 08/01/19 1530  BP: 114/60 114/78 132/77 114/85  Pulse: (!) 34 77 74 85  Resp: (!) 25 20 (!) 31 (!) 29  Temp:      TempSrc:      SpO2: 98% 99% 99% 98%  Weight:      Height:        Intake/Output Summary (Last 24 hours) at 08/01/2019 1643 Last data filed at 08/01/2019 1445 Gross per 24 hour  Intake 900 ml  Output 400 ml  Net 500 ml   Filed Weights   07/31/19 1149  Weight: 66.2 kg    Examination:  General exam: Chronic ill appearing Respiratory system: distress, bilateral crackles.  Cardiovascular system: S1 & S2 heard, RRR. No JVD, murmurs, rubs, gallops or clicks. No pedal edema. Gastrointestinal system: Abdomen is nondistended, soft and nontender. No organomegaly or masses felt. Normal bowel sounds heard. Central nervous system: Alert and oriented.  Extremities: Symmetric 5 x 5 power. Skin: No rashes, lesions or ulcers \  Data Reviewed: I have  personally reviewed following labs and imaging studies  CBC: Recent Labs  Lab 07/31/19 1225 07/31/19 1751 08/01/19 0400  WBC 12.8*  --  11.3*  NEUTROABS 10.7*  --   --   HGB 15.8 14.6 14.9  HCT 49.2 43.0 46.1  MCV 91.4   --  91.1  PLT 159  --  138*   Basic Metabolic Panel: Recent Labs  Lab 07/31/19 1225 07/31/19 1751 08/01/19 0400  NA 139 136 140  K 4.7 4.9 4.7  CL 98  --  101  CO2 31  --  29  GLUCOSE 117*  --  146*  BUN 26*  --  35*  CREATININE 0.86  --  0.87  CALCIUM 9.4  --  9.4   GFR: Estimated Creatinine Clearance: 58.1 mL/min (by C-G formula based on SCr of 0.87 mg/dL). Liver Function Tests: Recent Labs  Lab 07/31/19 1225  AST 38  ALT 35  ALKPHOS 53  BILITOT 1.5*  PROT 6.6  ALBUMIN 2.9*   No results for input(s): LIPASE, AMYLASE in the last 168 hours. No results for input(s): AMMONIA in the last 168 hours. Coagulation Profile: Recent Labs  Lab 07/31/19 1225  INR 1.0   Cardiac Enzymes: No results for input(s): CKTOTAL, CKMB, CKMBINDEX, TROPONINI in the last 168 hours. BNP (last 3 results) No results for input(s): PROBNP in the last 8760 hours. HbA1C: No results for input(s): HGBA1C in the last 72 hours. CBG: No results for input(s): GLUCAP in the last 168 hours. Lipid Profile: No results for input(s): CHOL, HDL, LDLCALC, TRIG, CHOLHDL, LDLDIRECT in the last 72 hours. Thyroid Function Tests: No results for input(s): TSH, T4TOTAL, FREET4, T3FREE, THYROIDAB in the last 72 hours. Anemia Panel: No results for input(s): VITAMINB12, FOLATE, FERRITIN, TIBC, IRON, RETICCTPCT in the last 72 hours. Sepsis Labs: Recent Labs  Lab 07/31/19 1240 07/31/19 1621  LATICACIDVEN 1.3 1.5    Recent Results (from the past 240 hour(s))  Culture, blood (Routine x 2)     Status: None (Preliminary result)   Collection Time: 07/31/19 12:18 PM   Specimen: BLOOD  Result Value Ref Range Status   Specimen Description BLOOD RIGHT ANTECUBITAL  Final   Special Requests   Final    BOTTLES DRAWN AEROBIC ONLY Blood Culture results may not be optimal due to an inadequate volume of blood received in culture bottles   Culture   Final    NO GROWTH 1 DAY Performed at [facility], [address] [place], [place] [address]    Report Status PENDING  Incomplete  Culture, blood (Routine x 2)     Status: None (Preliminary result)   Collection Time: 07/31/19 12:23 PM   Specimen: BLOOD  Result Value Ref Range Status   Specimen Description BLOOD LEFT ANTECUBITAL  Final   Special Requests   Final    BOTTLES DRAWN AEROBIC AND ANAEROBIC Blood Culture adequate volume   Culture   Final    NO GROWTH 1 DAY Performed at [facility], [address], [place], [place] [address]    Report Status PENDING  Incomplete  SARS CORONAVIRUS 2 (TAT 6-24 HRS) Nasopharyngeal Nasopharyngeal Swab     Status: None   Collection Time: 07/31/19 11:46 PM   Specimen: Nasopharyngeal Swab  Result Value Ref Range Status   SARS Coronavirus 2 NEGATIVE NEGATIVE Final    Comment: (NOTE) SARS-CoV-2 target nucleic acids are NOT DETECTED. The SARS-CoV-2 RNA is generally detectable in upper and lower respiratory specimens during  the acute phase of infection. Negative results do not preclude SARS-CoV-2 infection, do not rule out co-infections with other pathogens, and should not be used as the sole basis for treatment or other patient management decisions. Negative results must be combined with clinical observations, patient history, and epidemiological information. The expected result is Negative. Fact Sheet for Patients: SugarRoll.be Fact Sheet for Healthcare Providers: https://www.woods-mathews.com/ This test is not yet approved or cleared by the [place] FDA and  has been authorized for detection and/or diagnosis of SARS-CoV-2 by FDA under an Emergency Use Authorization (EUA). This EUA will remain  in effect (meaning this test can be used) for the duration of the COVID-19 declaration under Section 56 4(b)(1) of the Act, 21 U.S.C. section 360bbb-3(b)(1), unless the authorization is terminated or revoked sooner. Performed at [facility], [place]  [address] [place], [place] [address]          Radiology Studies: Ct Angio Chest Pe W And/or Wo Contrast  Result Date: 07/31/2019 CLINICAL DATA:  Shortness of breath, recent hospitalization for COVID-19 with postinflammatory fibrosis EXAM: CT ANGIOGRAPHY CHEST WITH CONTRAST TECHNIQUE: Multidetector CT imaging of the chest was performed using the standard protocol during bolus administration of intravenous contrast. Multiplanar CT image reconstructions and MIPs were obtained to evaluate the vascular anatomy. CONTRAST:  66mL OMNIPAQUE IOHEXOL 350 MG/ML SOLN COMPARISON:  Chest CT 07/13/2019 FINDINGS: Cardiovascular: Satisfactory opacification the pulmonary arteries to the segmental level. No pulmonary artery filling defects are identified. Central pulmonary arteries are normal caliber. There is reflux of contrast into the IVC. Cardiac size within normal limits. Coronary artery atherosclerosis is noted. No pericardial effusion. Atherosclerotic calcification throughout the normal caliber thoracic aorta and within the normally branching proximal great vessels. Mediastinum/Nodes: Few scattered subcentimeter low-attenuation nodes. Not significantly increased in number or size from comparison study. Bowing of the posterior trachea compatible with imaging during exhalation. No acute abnormality of the trachea or esophagus. Small hiatal hernia. Lungs/Pleura: Lung volumes are further diminished. There are extensive areas of mixed ground-glass opacity and consolidation within the lungs on a background of architectural distortion. Overall, there has been a pronounced increase in the amount of consolidation, most notably in the right upper lobe, left upper lobe and superior segment left lower lobe. No pneumothorax. Trace left effusion. Upper Abdomen: Post cholecystectomy. Nonspecific perinephric stranding, similar to prior possibly related to age or diminished renal function. Upper abdomen is otherwise unremarkable.  Musculoskeletal: No chest wall abnormality. No acute or significant osseous findings. Review of the MIP images confirms the above findings. IMPRESSION: 1. Negative for pulmonary embolus. 2. Interval worsening of the bilateral mixed ground-glass opacity and consolidation on a background of architectural distortion, most notably in the right upper lobe, left upper lobe and superior segment left lower lobe. Appearance is worrisome for a recrudescent infection or superinfection on a background of architectural distortion and fibrosis and low lung volumes. 3. Reflux of contrast to the IVC may reflect elevated right heart pressures, possibly due to the underlying parenchymal disease. 4. Trace left effusion. 5. Coronary artery and aortic atherosclerosis. Aortic Atherosclerosis (ICD10-I70.0). Electronically Signed   By: Lovena Le M.D.   On: 07/31/2019 20:43   Dg Chest Portable 1 View  Result Date: 07/31/2019 CLINICAL DATA:  Tachypnea.  COVID positive. EXAM: PORTABLE CHEST 1 VIEW COMPARISON:  CT 07/13/2019.  Chest x-ray 07/13/2019. FINDINGS: Mediastinum and hilar structures normal. Diffuse bilateral pulmonary infiltrates again noted without significant change. No pleural effusion or pneumothorax. Degenerative change thoracic spine. IMPRESSION:  Diffuse bilateral pulmonary infiltrates again noted without interim change in this known COVID 19 positive patient. Electronically Signed   By: Maisie Fus  Register   On: 07/31/2019 13:06        Scheduled Meds:  ALPRAZolam  0.25 mg Oral QHS   aspirin EC  81 mg Oral Daily   cholecalciferol  5,000 Units Oral q morning - 10a   enoxaparin (LOVENOX) injection  40 mg Subcutaneous Q24H   fluticasone  1 spray Each Nare Daily   guaiFENesin  600 mg Oral BID   ipratropium-albuterol  3 mL Nebulization Q6H   methylPREDNISolone (SOLU-MEDROL) injection  40 mg Intravenous Q12H   pantoprazole  40 mg Oral Daily   vitamin C  1,000 mg Oral BID   Continuous Infusions:    LOS: 1 day    Time spent: 35 minutes.     Alba Cory, MD [facility]   If 7PM-7AM, please contact night-coverage [email or web address] Password [facility] 08/01/2019, 4:43 PM

## 2019-08-02 DIAGNOSIS — J9621 Acute and chronic respiratory failure with hypoxia: Secondary | ICD-10-CM | POA: Diagnosis not present

## 2019-08-02 DIAGNOSIS — Z7189 Other specified counseling: Secondary | ICD-10-CM | POA: Diagnosis not present

## 2019-08-02 DIAGNOSIS — R0602 Shortness of breath: Secondary | ICD-10-CM

## 2019-08-02 DIAGNOSIS — Z66 Do not resuscitate: Secondary | ICD-10-CM

## 2019-08-02 DIAGNOSIS — J9601 Acute respiratory failure with hypoxia: Secondary | ICD-10-CM | POA: Diagnosis not present

## 2019-08-02 DIAGNOSIS — J9620 Acute and chronic respiratory failure, unspecified whether with hypoxia or hypercapnia: Secondary | ICD-10-CM | POA: Diagnosis not present

## 2019-08-02 DIAGNOSIS — U071 COVID-19: Secondary | ICD-10-CM | POA: Diagnosis not present

## 2019-08-02 DIAGNOSIS — J84112 Idiopathic pulmonary fibrosis: Secondary | ICD-10-CM

## 2019-08-02 MED ORDER — MORPHINE SULFATE (CONCENTRATE) 20 MG/ML PO SOLN: 10.0000 mg | ORAL | 0 refills | Status: DC | PRN

## 2019-08-02 MED ORDER — DEXAMETHASONE 4 MG PO TABS: 4.0000 mg | ORAL_TABLET | Freq: Two times a day (BID) | ORAL | 0 refills | Status: AC

## 2019-08-02 MED ORDER — DEXAMETHASONE 4 MG PO TABS: 4.0000 mg | ORAL_TABLET | Freq: Two times a day (BID) | ORAL | Status: DC

## 2019-08-02 MED ORDER — LORAZEPAM 1 MG PO TABS: 2.0000 mg | ORAL_TABLET | ORAL | Status: DC | PRN

## 2019-08-02 MED ORDER — DEXAMETHASONE 4 MG PO TABS: 2.0000 mg | ORAL_TABLET | Freq: Two times a day (BID) | ORAL | Status: DC

## 2019-08-02 MED ORDER — MORPHINE SULFATE (PF) 2 MG/ML IV SOLN: 2.0000 mg | INTRAVENOUS | Status: DC | PRN

## 2019-08-02 MED ORDER — SENNA 8.6 MG PO TABS
1.0000 | ORAL_TABLET | Freq: Two times a day (BID) | ORAL | Status: DC
  Administered 2019-08-02: 8.6 mg via ORAL
  Filled 2019-08-02: qty 1

## 2019-08-02 MED ORDER — SENNA 8.6 MG PO TABS: 1.0000 | ORAL_TABLET | Freq: Two times a day (BID) | ORAL | 0 refills | Status: AC

## 2019-08-02 MED ORDER — IPRATROPIUM-ALBUTEROL 0.5-2.5 (3) MG/3ML IN SOLN
3.0000 mL | Freq: Four times a day (QID) | RESPIRATORY_TRACT | Status: DC | PRN
  Administered 2019-08-02: 3 mL via RESPIRATORY_TRACT

## 2019-08-02 NOTE — Progress Notes (Signed)
Equipment may not be delivered today. Pt also had to temp replace on NRB. Pt daughter also gave pt gas x per daughter. Reminded not to give medicine at this time-staff will provide.

## 2019-08-02 NOTE — Progress Notes (Signed)
Palliative Medicine RN Note: Rec'd a call from pt's daughter [name] ([phone]). She is concerned that Mr Thum has a very short time left (days, not weeks), and would like to get him home asap. He would need 15L O2 for his NRB. She is also concerned because she was made to leave last night despite having an order for unrestricted visitation due to the patient being at end of life.  I called PMT NP Mariana Kaufman and obtained an order for home hospice. Patient lives in [place], so referral was made to [facility] (spoke to [employer]); updated her on need for 15L O2 for NRB. Called front dest with the names of the 4 approved visitors [name], Fara Olden, Alma Downs), and called 2W desk to let them know that pt has been approved for EOL family visitation per current [facility] visitation policy.  Marjie Skiff Vickey Ewbank, RN, BSN, [facility] Palliative Medicine Team 08/02/2019 8:46 AM Office [phone]

## 2019-08-02 NOTE — Discharge Summary (Signed)
Physician Discharge Summary  Alejandro Hicks DOB: 02/27/1934 DOA: 07/31/2019  PCP: Georgann HousekeeperHusain, Karrar, MD  Admit date: 07/31/2019 Discharge date: 08/02/2019  Admitted From: Home Disposition: Home  Recommendations for Outpatient Follow-up:  1. Hospice    Discharge Condition: Hospice CODE STATUS: DNR Diet recommendation: Comfort   Brief/Interim Summary:  Admission HPI written by Alessandra BevelsNeelima Kamineni, MD   Chief Complaint: Progressive dyspnea  HPI: Alejandro Hicks is a 85 y.o. male with h/o asthma, HLD, GERD, OSA not on CPAP, HTN, anxiety, tobacco abuse, recent 3 hospitalizations 10/3-10/13 for Covid 19 pneumonia treated with remdesivir and systemic steroids, SNF recommended but he declined and went home, readmitted 10/16-10/29 for acute on chronic hypoxic respiratory failure, treated for suspected pneumonia complicating recent COVID-19 pneumonia, with ceftriaxone and doxycycline, discharged to SNF on tapering steroids, presented again to ED on 07/13/2019 due to progressive dyspnea on minimal exertion with acute on chronic hypoxic respiratory failure secondary to sequelae from recent COVID-19 pneumonia at which time CT chest was obtained which resulted negative for PE, repeat Covid testing was again positive, Decadron was switched to Solu-Medrol, doxycycline was continued, pulmonology was consulted for assistance and ultimately patient was discharged home the next day (07/14/2019) with home O2 3 L to be titrated as needed per home pulse ox readings.  Today patient presents to the ED with dyspnea and increasing oxygen requirements up to 10 L O2.  According to patient and daughter, he was requiring 6 to 8 L of O2 with minimal exertion like going to the bathroom even upon last discharge.  Over the last 3 to 4 days he is gotten progressively dyspneic.  He was following pulmonary as outpatient and steroids were being tapered slowly.  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 Last night patient had significant event with air hunger/dyspnea even at rest.  Daughter bumped up his 0 2 to 8 L, he was still satting only at 90% subsequent to which he increased his O2 level to 10 L and brought him to the ED upon MDs advise.  Patient currently resting comfortably and satting 97% on 100% NRB M.  Bedside monitor shows mild hypotension with systolic 90s to 100s.  Reports taking Diovan at home but eating poorly lately.  Is hungry and eager to eat.  He understands poor prognosis and stated would not want to be intubated if he were to decline with external O2 support.    Hospital course:  Acute on chronic respiratory failure with hypoxia Secondary to pulmonary fibrosis in setting of recent COVID-19 infection. Pulmonology consulted. Patient managed on Solumedrol 40 mg q12 hours and oxygen supplementation. Patient with continued high oxygen requirements and upon discussion with pulmonology, made comfort care with goals to go home with hospice. Patient discharged with morphine and will continue oxygen therapy as an outpatient. Pulmonology is not recommending steroids on discharge, however, palliative recommends for comfort measures. Patient also discharged with a prescription for decadron but this can be discontinued promptly if needed.  Essential hypertension Discontinue valsartan-hydrochlorothiazide  GERD Continue home Dexilant  Anxiety Continue home Xanax  Thrombocytopenia Noted. No further management.  Discharge Diagnoses:  Principal Problem:   Acute respiratory failure with hypoxia (HCC) Active Problems:   Lumbar degenerative disc disease   Essential hypertension, benign   GERD (gastroesophageal reflux disease)   COVID-19   Respiratory failure (HCC)   DNR (do not resuscitate)   Shortness of breath    Discharge  Instructions   Allergies as of 08/02/2019      Reactions   Lipitor  [atorvastatin] Other (See Comments)   Myalgia   Zocor [simvastatin] Other (See Comments)   Myalgia      Medication List    STOP taking these medications   acetaminophen 500 MG tablet Commonly known as: TYLENOL   AeroChamber Plus Flo-Vu Large Misc   albuterol 108 (90 Base) MCG/ACT inhaler Commonly known as: ProAir HFA   aspirin EC 81 MG tablet   fluticasone 50 MCG/ACT nasal spray Commonly known as: FLONASE   Gas-X 80 MG chewable tablet Generic drug: simethicone   guaiFENesin-dextromethorphan 100-10 MG/5ML syrup Commonly known as: ROBITUSSIN DM   liver oil-zinc oxide 40 % ointment Commonly known as: DESITIN   mineral oil-hydrophilic petrolatum ointment   OXYGEN   testosterone cypionate 200 MG/ML injection Commonly known as: DEPOTESTOSTERONE CYPIONATE   valsartan-hydrochlorothiazide 320-25 MG tablet Commonly known as: DIOVAN-HCT   vitamin C 1000 MG tablet   Vitamin D3 50 MCG (2000 UT) capsule   VITAMIN K2 PO     TAKE these medications   ALPRAZolam 0.25 MG tablet Commonly known as: XANAX Take 1 tablet (0.25 mg total) by mouth at bedtime.   dexamethasone 4 MG tablet Commonly known as: DECADRON Take 1 tablet (4 mg total) by mouth 2 (two) times daily with breakfast and lunch. Start taking on: August 03, 2019 What changed:   medication strength  how much to take  how to take this  when to take this  additional instructions  Another medication with the same name was removed. Continue taking this medication, and follow the directions you see here.   Dexilant 60 MG capsule Generic drug: dexlansoprazole TAKE 1 CAPSULE (60 MG TOTAL) BY MOUTH EVERY MORNING. What changed: See the new instructions.   morphine 20 MG/ML concentrated solution Commonly known as: ROXANOL Take 0.5 mLs (10 mg total) by mouth every 4 (four) hours as needed for shortness of breath (air hunger).   senna 8.6 MG Tabs tablet Commonly known as: SENOKOT Take 1 tablet (8.6 mg  total) by mouth 2 (two) times daily.       Allergies  Allergen Reactions   Lipitor [Atorvastatin] Other (See Comments)    Myalgia   Zocor [Simvastatin] Other (See Comments)    Myalgia    Consultations:  Pulmonology  Palliative care medicine   Procedures/Studies: Ct Angio Chest Pe W And/or Wo Contrast  Result Date: 07/31/2019 CLINICAL DATA:  Shortness of breath, recent hospitalization for COVID-19 with postinflammatory fibrosis EXAM: CT ANGIOGRAPHY CHEST WITH CONTRAST TECHNIQUE: Multidetector CT imaging of the chest was performed using the standard protocol during bolus administration of intravenous contrast. Multiplanar CT image reconstructions and MIPs were obtained to evaluate the vascular anatomy. CONTRAST:  75mL OMNIPAQUE IOHEXOL 350 MG/ML SOLN COMPARISON:  Chest CT 07/13/2019 FINDINGS: Cardiovascular: Satisfactory opacification the pulmonary arteries to the segmental level. No pulmonary artery filling defects are identified. Central pulmonary arteries are normal caliber. There is reflux of contrast into the IVC. Cardiac size within normal limits. Coronary artery atherosclerosis is noted. No pericardial effusion. Atherosclerotic calcification throughout the normal caliber thoracic aorta and within the normally branching proximal great vessels. Mediastinum/Nodes: Few scattered subcentimeter low-attenuation nodes. Not significantly increased in number or size from comparison study. Bowing of the posterior trachea compatible with imaging during exhalation. No acute abnormality of the trachea or esophagus. Small hiatal hernia. Lungs/Pleura: Lung volumes are further diminished. There are extensive areas of mixed ground-glass opacity and consolidation within  the lungs on a background of architectural distortion. Overall, there has been a pronounced increase in the amount of consolidation, most notably in the right upper lobe, left upper lobe and superior segment left lower lobe. No  pneumothorax. Trace left effusion. Upper Abdomen: Post cholecystectomy. Nonspecific perinephric stranding, similar to prior possibly related to age or diminished renal function. Upper abdomen is otherwise unremarkable. Musculoskeletal: No chest wall abnormality. No acute or significant osseous findings. Review of the MIP images confirms the above findings. IMPRESSION: 1. Negative for pulmonary embolus. 2. Interval worsening of the bilateral mixed ground-glass opacity and consolidation on a background of architectural distortion, most notably in the right upper lobe, left upper lobe and superior segment left lower lobe. Appearance is worrisome for a recrudescent infection or superinfection on a background of architectural distortion and fibrosis and low lung volumes. 3. Reflux of contrast to the IVC may reflect elevated right heart pressures, possibly due to the underlying parenchymal disease. 4. Trace left effusion. 5. Coronary artery and aortic atherosclerosis. Aortic Atherosclerosis (ICD10-I70.0). Electronically Signed   By: Lovena Le M.D.   On: 07/31/2019 20:43   Ct Angio Chest Pe W And/or Wo Contrast  Result Date: 07/13/2019 CLINICAL DATA:  85 year old male with shortness of breath. On home oxygen. Status post COVID-19 in September. EXAM: CT ANGIOGRAPHY CHEST WITH CONTRAST TECHNIQUE: Multidetector CT imaging of the chest was performed using the standard protocol during bolus administration of intravenous contrast. Multiplanar CT image reconstructions and MIPs were obtained to evaluate the vascular anatomy. CONTRAST:  155mL OMNIPAQUE IOHEXOL 350 MG/ML SOLN COMPARISON:  CTA chest 06/09/2019 and earlier. FINDINGS: Cardiovascular: Good contrast bolus timing in the pulmonary arterial tree. Mild respiratory motion. No focal filling defect identified in the pulmonary arteries to suggest acute pulmonary embolism. Calcified coronary artery atherosclerosis (series 7, image 135). Cardiac size at the upper limits of  normal. No pericardial effusion. Negative visible aorta aside from atherosclerosis. Mediastinum/Nodes: Negative. No lymphadenopathy. Small gastric hiatal hernia. Lungs/Pleura: Lower lung volumes compared to October. Persistent confluent abnormal upper lobe pulmonary opacity, now ground-glass density rather than the consolidation seen in October, but in an identical configuration otherwise. Similar but less pronounced bilateral lower lobe and lingula involvement. There is some superimposed bronchiectasis. Additional scattered bilateral areas of ground-glass density in October have faded but not resolved. The major airways remain patent. No new areas of pulmonary opacity. No pleural effusions. Upper Abdomen: Surgically absent gallbladder. Stable and negative visible liver, spleen, pancreas, adrenal glands, kidneys, and bowel in the upper abdomen (small gastric hiatal hernia). Musculoskeletal: No acute osseous abnormality identified. Review of the MIP images confirms the above findings. IMPRESSION: 1. Negative for acute pulmonary embolus. 2. Since October only slight improvement in the widespread multifocal pneumonia thought due to COVID-19; the confluent areas of consolidation previously have evolved to ground-glass opacity now. 3. No new abnormality in the chest. 4. Calcified coronary artery and Aortic Atherosclerosis (ICD10-I70.0). 5. Small gastric hiatal hernia. Electronically Signed   By: Genevie Ann M.D.   On: 07/13/2019 21:26   Dg Chest Portable 1 View  Result Date: 07/31/2019 CLINICAL DATA:  Tachypnea.  COVID positive. EXAM: PORTABLE CHEST 1 VIEW COMPARISON:  CT 07/13/2019.  Chest x-ray 07/13/2019. FINDINGS: Mediastinum and hilar structures normal. Diffuse bilateral pulmonary infiltrates again noted without significant change. No pleural effusion or pneumothorax. Degenerative change thoracic spine. IMPRESSION: Diffuse bilateral pulmonary infiltrates again noted without interim change in this known COVID 19  positive patient. Electronically Signed   By: Marcello Moores  Register  On: 07/31/2019 13:06   Dg Chest Portable 1 View  Result Date: 07/13/2019 CLINICAL DATA:  Shortness of breath EXAM: PORTABLE CHEST 1 VIEW COMPARISON:  CT 06/09/2019 FINDINGS: Persistent patchy bilateral areas of airspace disease most pronounced in the left mid to lower lung and the right mid lung. No pneumothorax. Partial obscuration of left hemidiaphragm may reflect trace pleural fluid. Mild tortuosity of the tracheal air column is similar to priors. The cardiomediastinal contours are unremarkable. No acute osseous or soft tissue abnormality. Degenerative changes are present in the imaged spine and shoulders. Cholecystectomy clips in the right upper quadrant. Mask wire at the base of the neck. IMPRESSION: Persistent patchy bilateral areas of airspace disease, most pronounced in the left mid to lower lung and the right mid lung, could reflect residual infection or post infectious/inflammatory change. Electronically Signed   By: Kreg Shropshire M.D.   On: 07/13/2019 19:56      Subjective: No dyspnea. No chest pain.  Discharge Exam: Vitals:   08/02/19 1141 08/02/19 1215  BP:  (!) 128/52  Pulse: 73   Resp: 17 18  Temp:  98.6 F (37 C)  SpO2:  98%   Vitals:   08/02/19 0831 08/02/19 1000 08/02/19 1141 08/02/19 1215  BP:  133/65  (!) 128/52  Pulse: 93  73   Resp:  (!) Temp: 97.9 F (36.6 C)   98.6 F (37 C)  TempSrc: Oral   Oral  SpO2:  98%  98%  Weight:      Height:        General: Pt is alert, awake, not in acute distress Cardiovascular: RRR, S1/S2 +, no rubs, no gallops Respiratory: Diminished, no wheezing, no rhonchi Abdominal: Soft, NT, ND, bowel sounds + Extremities: no edema, no cyanosis    The results of significant diagnostics from this hospitalization (including imaging, microbiology, ancillary and laboratory) are listed below for reference.     Microbiology: Recent Results (from the past 240  hour(s))  Culture, blood (Routine x 2)     Status: None (Preliminary result)   Collection Time: 07/31/19 12:18 PM   Specimen: BLOOD  Result Value Ref Range Status   Specimen Description BLOOD RIGHT ANTECUBITAL  Final   Special Requests   Final    BOTTLES DRAWN AEROBIC ONLY Blood Culture results may not be optimal due to an inadequate volume of blood received in culture bottles   Culture   Final    NO GROWTH 2 DAYS Performed at [facility], [address], [place], [place] [address]    Report Status PENDING  Incomplete  Culture, blood (Routine x 2)     Status: None (Preliminary result)   Collection Time: 07/31/19 12:23 PM   Specimen: BLOOD  Result Value Ref Range Status   Specimen Description BLOOD LEFT ANTECUBITAL  Final   Special Requests   Final    BOTTLES DRAWN AEROBIC AND ANAEROBIC Blood Culture adequate volume   Culture   Final    NO GROWTH 2 DAYS Performed at [facility], [address], [place], [place] [address]    Report Status PENDING  Incomplete  SARS CORONAVIRUS 2 (TAT 6-24 HRS) Nasopharyngeal Nasopharyngeal Swab     Status: None   Collection Time: 07/31/19 11:46 PM   Specimen: Nasopharyngeal Swab  Result Value Ref Range Status   SARS Coronavirus 2 NEGATIVE NEGATIVE Final    Comment: (NOTE) SARS-CoV-2 target nucleic acids are NOT DETECTED. The SARS-CoV-2 RNA is generally  detectable in upper and lower respiratory specimens during the acute phase of infection. Negative results do not preclude SARS-CoV-2 infection, do not rule out co-infections with other pathogens, and should not be used as the sole basis for treatment or other patient management decisions. Negative results must be combined with clinical observations, patient history, and epidemiological information. The expected result is Negative. Fact Sheet for Patients: HairSlick.no Fact Sheet for Healthcare Providers: quierodirigir.com This  test is not yet approved or cleared by the [place] FDA and  has been authorized for detection and/or diagnosis of SARS-CoV-2 by FDA under an Emergency Use Authorization (EUA). This EUA will remain  in effect (meaning this test can be used) for the duration of the COVID-19 declaration under Section 56 4(b)(1) of the Act, 21 U.S.C. section 360bbb-3(b)(1), unless the authorization is terminated or revoked sooner. Performed at [facility], [address], [place], [place] [address]      Labs: BNP (last 3 results) Recent Labs    06/14/19 0213 07/13/19 2351 08/01/19 0520  BNP 22.6 23.2 40.4   Basic Metabolic Panel: Recent Labs  Lab 07/31/19 1225 07/31/19 1751 08/01/19 0400  NA 139 136 140  K 4.7 4.9 4.7  CL 98  --  101  CO2 31  --  29  GLUCOSE 117*  --  146*  BUN 26*  --  35*  CREATININE 0.86  --  0.87  CALCIUM 9.4  --  9.4   Liver Function Tests: Recent Labs  Lab 07/31/19 1225  AST 38  ALT 35  ALKPHOS 53  BILITOT 1.5*  PROT 6.6  ALBUMIN 2.9*   No results for input(s): LIPASE, AMYLASE in the last 168 hours. No results for input(s): AMMONIA in the last 168 hours. CBC: Recent Labs  Lab 07/31/19 1225 07/31/19 1751 08/01/19 0400 08/01/19 1830  WBC 12.8*  --  11.3* 9.7  NEUTROABS 10.7*  --   --  8.9*  HGB 15.8 14.6 14.9 14.4  HCT 49.2 43.0 46.1 43.6  MCV 91.4  --  91.1 89.9  PLT 159  --  138* 146*   Cardiac Enzymes: No results for input(s): CKTOTAL, CKMB, CKMBINDEX, TROPONINI in the last 168 hours. BNP: Invalid input(s): POCBNP CBG: No results for input(s): GLUCAP in the last 168 hours. D-Dimer No results for input(s): DDIMER in the last 72 hours. Hgb A1c No results for input(s): HGBA1C in the last 72 hours. Lipid Profile No results for input(s): CHOL, HDL, LDLCALC, TRIG, CHOLHDL, LDLDIRECT in the last 72 hours. Thyroid function studies No results for input(s): TSH, T4TOTAL, T3FREE, THYROIDAB in the last 72 hours.  Invalid input(s):  FREET3 Anemia work up No results for input(s): VITAMINB12, FOLATE, FERRITIN, TIBC, IRON, RETICCTPCT in the last 72 hours. Urinalysis    Component Value Date/Time   COLORURINE YELLOW 07/31/2019 1525   APPEARANCEUR HAZY (A) 07/31/2019 1525   LABSPEC 1.023 07/31/2019 1525   PHURINE 6.0 07/31/2019 1525   GLUCOSEU NEGATIVE 07/31/2019 1525   HGBUR NEGATIVE 07/31/2019 1525   BILIRUBINUR NEGATIVE 07/31/2019 1525   KETONESUR NEGATIVE 07/31/2019 1525   PROTEINUR 100 (A) 07/31/2019 1525   UROBILINOGEN 0.2 05/30/2011 2051   NITRITE NEGATIVE 07/31/2019 1525   LEUKOCYTESUR NEGATIVE 07/31/2019 1525   Sepsis Labs Invalid input(s): PROCALCITONIN,  WBC,  LACTICIDVEN Microbiology Recent Results (from the past 240 hour(s))  Culture, blood (Routine x 2)     Status: None (Preliminary result)   Collection Time: 07/31/19 12:18 PM   Specimen: BLOOD  Result  Value Ref Range Status   Specimen Description BLOOD RIGHT ANTECUBITAL  Final   Special Requests   Final    BOTTLES DRAWN AEROBIC ONLY Blood Culture results may not be optimal due to an inadequate volume of blood received in culture bottles   Culture   Final    NO GROWTH 2 DAYS Performed at [facility], [address], [place], [place] [address]    Report Status PENDING  Incomplete  Culture, blood (Routine x 2)     Status: None (Preliminary result)   Collection Time: 07/31/19 12:23 PM   Specimen: BLOOD  Result Value Ref Range Status   Specimen Description BLOOD LEFT ANTECUBITAL  Final   Special Requests   Final    BOTTLES DRAWN AEROBIC AND ANAEROBIC Blood Culture adequate volume   Culture   Final    NO GROWTH 2 DAYS Performed at [facility], [address], [place], [place] [address]    Report Status PENDING  Incomplete  SARS CORONAVIRUS 2 (TAT 6-24 HRS) Nasopharyngeal Nasopharyngeal Swab     Status: None   Collection Time: 07/31/19 11:46 PM   Specimen: Nasopharyngeal Swab  Result Value Ref Range Status   SARS Coronavirus 2  NEGATIVE NEGATIVE Final    Comment: (NOTE) SARS-CoV-2 target nucleic acids are NOT DETECTED. The SARS-CoV-2 RNA is generally detectable in upper and lower respiratory specimens during the acute phase of infection. Negative results do not preclude SARS-CoV-2 infection, do not rule out co-infections with other pathogens, and should not be used as the sole basis for treatment or other patient management decisions. Negative results must be combined with clinical observations, patient history, and epidemiological information. The expected result is Negative. Fact Sheet for Patients: HairSlick.no Fact Sheet for Healthcare Providers: quierodirigir.com This test is not yet approved or cleared by the [place] FDA and  has been authorized for detection and/or diagnosis of SARS-CoV-2 by FDA under an Emergency Use Authorization (EUA). This EUA will remain  in effect (meaning this test can be used) for the duration of the COVID-19 declaration under Section 56 4(b)(1) of the Act, 21 U.S.C. section 360bbb-3(b)(1), unless the authorization is terminated or revoked sooner. Performed at [facility], [address] [place], [place] [address]      Time coordinating discharge: 35 minutes  SIGNED:   Jacquelin Hawking, MD [facility] 08/02/2019, 1:07 PM

## 2019-08-02 NOTE — Progress Notes (Signed)
Education complete. Family aware. Oncoming nurse made aware of need for iv site removal once transport arrives and paperwork on chart. Care transferred.

## 2019-08-02 NOTE — TOC Initial Note (Signed)
Transition of Care [facility]) - Initial/Assessment Note    Patient Details  Name: Alejandro Hicks MRN: [id number] Date of Birth: 03-28-1934  Transition of Care [facility]) CM/SW Contact:    Benard Halsted, LCSW Phone Number: 08/02/2019, 9:05 AM  Clinical Narrative:                 Patient is a 85 y.o. male with past medical history of COVID + pneumonia with residual pulmonary fibrosis, OSA, GERD, BPH, asthma, and anxiety. Patient presents to the hospital with a high hospital readmission risk score.   Patient's family currently requesting to take the patient home with hospice. [facility] [place] working on the referral and need for oxygen.   Expected Discharge Plan: Home w Hospice Care Barriers to Discharge: Continued Medical Work up   Patient Goals and CMS Choice Patient states their goals for this hospitalization and ongoing recovery are:: Returning home CMS Medicare.gov Compare Post Acute Care list provided to:: Patient(Daughter) Choice offered to / list presented to : Adult Children, Patient  Expected Discharge Plan and Services Expected Discharge Plan: Home w Hospice Care In-house Referral: Clinical Social Work, Hospice / Palliative Care Discharge Planning Services: CM Consult Post Acute Care Choice: Hospice(Home Hospice) Living arrangements for the past 2 months: Single Family Home                           HH Arranged: NA          Prior Living Arrangements/Services Living arrangements for the past 2 months: Single Family Home Lives with:: Adult Children, Self Patient language and need for interpreter reviewed:: Yes Do you feel safe going back to the place where you live?: Yes      Need for Family Participation in Patient Care: Yes (Comment) Care giver support system in place?: Yes (comment)   Criminal Activity/Legal Involvement Pertinent to Current Situation/Hospitalization: No - Comment as needed  Activities of Daily Living Home Assistive Devices/Equipment:  Wheelchair, Environmental consultant (specify type) ADL Screening (condition at time of admission) Patient's cognitive ability adequate to safely complete daily activities?: Yes Is the patient deaf or have difficulty hearing?: No Does the patient have difficulty seeing, even when wearing glasses/contacts?: No Does the patient have difficulty concentrating, remembering, or making decisions?: No Patient able to express need for assistance with ADLs?: Yes Does the patient have difficulty dressing or bathing?: Yes Independently performs ADLs?: No Communication: Needs assistance Is this a change from baseline?: Pre-admission baseline Dressing (OT): Needs assistance Is this a change from baseline?: Pre-admission baseline Grooming: Needs assistance Is this a change from baseline?: Pre-admission baseline Feeding: Independent Bathing: Needs assistance Is this a change from baseline?: Pre-admission baseline Toileting: Needs assistance Is this a change from baseline?: Pre-admission baseline In/Out Bed: Needs assistance Is this a change from baseline?: Pre-admission baseline Walks in Home: Needs assistance Is this a change from baseline?: Pre-admission baseline Does the patient have difficulty walking or climbing stairs?: Yes Weakness of Legs: None Weakness of Arms/Hands: None  Permission Sought/Granted Permission sought to share information with : Facility Sport and exercise psychologist, Family Supports Permission granted to share information with : No  Share Information with NAME: [name]  Permission granted to share info w AGENCY: Hospice  Permission granted to share info w Relationship: Daughter  Permission granted to share info w Contact Information: [phone]  Emotional Assessment Appearance:: Appears stated age Attitude/Demeanor/Rapport: Unable to Assess Affect (typically observed): Unable to Assess Orientation: : Oriented to Self, Oriented  to Place, Oriented to  Time, Oriented to Situation Alcohol /  Substance Use: Not Applicable Psych Involvement: No (comment)  Admission diagnosis:  Acute on chronic respiratory failure, unspecified whether with hypoxia or hypercapnia (HCC) [J96.20] Respiratory failure (HCC) [J96.90] Patient Active Problem List   Diagnosis Date Noted  . Respiratory failure (HCC) 08/01/2019  . DNR (do not resuscitate) 08/01/2019  . Palliative care by specialist   . Advanced care planning/counseling discussion   . Goals of care, counseling/discussion   . COVID-19   . Acute respiratory failure with hypoxia (HCC)   . Pneumonia due to COVID-19 virus 06/09/2019  . GERD (gastroesophageal reflux disease) 06/09/2019  . Acute hypoxemic respiratory failure due to COVID-19 (HCC) 06/09/2019  . Elevated liver enzymes 06/09/2019  . Pneumonia due to 2019-nCoV 05/28/2019  . Primary osteoarthritis of right hip 12/13/2018  . Plantar fasciitis, right 02/16/2018  . Primary osteoarthritis of right ankle 01/19/2018  . Shoulder impingement syndrome, left 10/01/2017  . Rib pain on right side 12/09/2015  . Right shoulder pain with history of right proximal biceps rupture 12/09/2015  . Essential hypertension, benign 10/21/2015  . Lumbar degenerative disc disease 09/02/2015  . DDD (degenerative disc disease), cervical 08/05/2015   PCP:  Georgann Housekeeper, MD Pharmacy:   [phone] [address]  - [address] [place] [address] Phone: [phone] Fax: [phone]     Social Determinants of Health (SDOH) Interventions    Readmission Risk Interventions Readmission Risk Prevention Plan 08/02/2019  Transportation Screening Complete  PCP or Specialist Appt within 3-5 Days Complete  HRI or Home Care Consult Complete  Social Work Consult for Recovery Care Planning/Counseling Complete  Palliative Care Screening Complete  Medication Review Oceanographer) Referral to Pharmacy  Some recent data might be hidden

## 2019-08-02 NOTE — Progress Notes (Signed)
Per ID =pt no longer droplet precautions.

## 2019-08-02 NOTE — Progress Notes (Signed)
Patient continues on 100% NRB, when attempting to do Hand Held Nebulizer earlier, patent unable to tolerate not being on 100%. SATs dropped from 97-99% to 82% before treatment could be completed. Respiratory rate increased from 24-30 to 42-50 with much increase work of breathing. Patient has BBS at this time with no wheezing and SATs are at 97-98% on the NRB. Will continue to monitor progress but treatment at this time being held until patient is able to tolerate better. RN aware and patient aware and okay with decision.

## 2019-08-02 NOTE — TOC Progression Note (Signed)
Transition of Care [facility]) - Progression Note    Patient Details  Name: AMARIUS TOTO MRN: [id number] Date of Birth: [birth year]  Transition of Care [facility]) CM/SW [place], LCSW Phone Number: 08/02/2019, 4:53 PM  Clinical Narrative:    Oxygen still has not arrived at the patient's home. RN aware to please check with patient's daughter to see if it has arrived by 6pm, otherwise family may want to wait until tomorrow to transport the patient home (PTAR may run behind and could take several hours). PTAR forms are on the chart in the event the oxygen does come through in time (PTAR#:[phone]).    Expected Discharge Plan: Home w Hospice Care Barriers to Discharge: Continued Medical Work up  Expected Discharge Plan and Services Expected Discharge Plan: Home w Hospice Care In-house Referral: Clinical Social Work, Hospice / Palliative Care Discharge Planning Services: CM Consult Post Acute Care Choice: Hospice(Home Hospice) Living arrangements for the past 2 months: Single Family Home Expected Discharge Date: 08/02/19                         HH Arranged: NA           Social Determinants of Health (SDOH) Interventions    Readmission Risk Interventions Readmission Risk Prevention Plan 08/02/2019  Transportation Screening Complete  PCP or Specialist Appt within 3-5 Days Complete  HRI or [place] Complete  Social Work Consult for [place] Planning/Counseling Complete  Palliative Care Screening Complete  Medication Review Press photographer) Referral to Pharmacy  Some recent data might be hidden

## 2019-08-02 NOTE — TOC Progression Note (Signed)
Transition of Care [facility]) - Progression Note    Patient Details  Name: Alejandro Hicks MRN: [id number] Date of Birth: 1934-05-18  Transition of Care [facility]) CM/SW [place], LCSW Phone Number: 08/02/2019, 11:59 AM  Clinical Narrative:    Per [place], once patient's oxygen equipment has been delivered to the house, we will be able to arrange transportation for patient to discharge. MD aware. CSW will notify the RN when ready.    Expected Discharge Plan: Home w Hospice Care Barriers to Discharge: Continued Medical Work up  Expected Discharge Plan and Services Expected Discharge Plan: [place] In-house Referral: Clinical Social Work, Hospice / Palliative Care Discharge Planning Services: CM Consult Post Acute Care Choice: Hospice(Home Hospice) Living arrangements for the past 2 months: Single Family Home                           HH Arranged: NA           Social Determinants of Health (SDOH) Interventions    Readmission Risk Interventions Readmission Risk Prevention Plan 08/02/2019  Transportation Screening Complete  PCP or Specialist Appt within 3-5 Days Complete  HRI or [place] Complete  Social Work Consult for [place] Planning/Counseling Complete  Palliative Care Screening Complete  Medication Review Press photographer) Referral to Pharmacy  Some recent data might be hidden

## 2019-08-02 NOTE — Discharge Instructions (Signed)
Alejandro Hicks,  You were in the hospital with low oxygen levels. You have been set up for home with Hospice.

## 2019-08-02 NOTE — Progress Notes (Signed)
Per RN that MD would like to attempt weaning oxygen down before leaving if able for home oxygen. Switched patient from 15L NRB to a salter starting at 15LPM and will gradually wean down as able.  Currently at 10 LPM Salter nasal cannula with Sat of 97%. Patient states his breathing is fine. No increased WOB or SOB. RN aware. RT will continue to monitor.

## 2019-08-02 NOTE — Consult Note (Addendum)
NAME:  Alejandro Hicks, MRN:  [id number], DOB:  June 22, 1934, LOS: 2 ADMISSION DATE:  07/31/2019, CONSULTATION DATE:  08/01/2019 REFERRING MD: Earnest Conroy CHIEF COMPLAINT:  Progressive dyspnea   Brief History   85 y.o. male with h/o asthma, HLD, GERD, OSA not on CPAP, HTN, anxiety, tobacco abuse s/p COVID 19 pneumonia admitted with progressive dyspnea and hypoxia.   History of present illness   85 y.o. male with h/o asthma, HLD, GERD, OSA not on CPAP, HTN, anxiety, tobacco abuse s/p COVID 19 pneumonia  He contracted COVID-19 infection /p golf trip in September.  He was hospitalized at [facility] and treated with IV remdesivir and steroids.  Was discharged home with oxygen, and represented only 3 days later with worsening dyspnea.  [facility] and this time treated for a superimposed bacterial infection empirically.  Was again discharged home on 10/29, and represented on 11/19 with dyspnea.  He has been treated with steroids once with prednisone then Decadron taper. He was discharged 11/20 on home O2 3Lpm to be titrated per home pulse ox along with decadron taper as mentioned. 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   On 12/7 he presented to the ED with dyspnea and increasing oxygen requirements up to 10 L O2.  According to patient and daughter, he was requiring 6 to 8 L of O2 with minimal exertion like going to the bathroom even upon last discharge.  Over the last 3 to 4 days he became progressively dyspneic. Daughter bumped up his 0 2 to 8 L, he was still satting only at 90% subsequent to which he increased his O2 level to 10 L and brought him to the ED upon MD's advice.   Patient was started on steroid therapy during admission however given progressive Covid fibrosis and need of high amounts of supplemental oxygen decision was made to transition to  palliative care. Today 12/9 patient remains stable on high-dose supplemental oxygen.  From a pulmonology standpoint patient is stabilized to be discharged home to hospice care. No indication from pulmonology standpoint to continue steroid therapy at time of discharge but discussion help with palliative medicaine who recommended continued steroid for symptom management, will defer to palliative team for dosing.  Past Medical History   Past Medical History:  Diagnosis Date   Allergic rhinitis    Anxiety    Asthma    BPH (benign prostatic hypertrophy)    Colonic polyp    DNR (do not resuscitate) 08/01/2019   Dyslipidemia    ED (erectile dysfunction)    GERD (gastroesophageal reflux disease)    Inguinal hernia    Left S/P repair in 2012   Leukoplakia of vocal cords    OA (osteoarthritis)    OSA (obstructive sleep apnea)    Does not use CPAP   Reflux laryngitis    ENT ecal, PPIs twice a day   Rosacea      Significant Hospital Events   12/7 Admitted  Consults:  PCCM  Palliative care  Procedures:  NA  Significant Diagnostic Tests:  CT Angio Chest 12/7 IMPRESSION: 1. Negative for pulmonary embolus. 2. Interval worsening of the bilateral mixed ground-glass opacity and consolidation on a background of architectural distortion, most notably in the right upper lobe, left upper lobe and superior segment left lower lobe. Appearance is worrisome for a recrudescent infection or superinfection on a background of architectural  and fibrosis and low lung volumes. °3. Reflux of contrast to the IVC may reflect elevated right heart °pressures, possibly due to the underlying parenchymal disease. °4. Trace left effusion. °5. Coronary artery and aortic atherosclerosis. ° ° °Micro Data:  °12/7: SARS Coronavirus 2 negative ° ° °Antimicrobials:  °NA  ° °Interim history/subjective:  °RN reports no acute events overnight, patient states he feels okay with dyspnea controlled at  rest but significantly worsens with any movement. ° °Objective   °Blood pressure 133/65, pulse 93, temperature 97.9 °F (36.6 °C), temperature source Oral, resp. rate (!) 21, height 5' 6" (1.676 m), weight 66.2 kg, SpO2 98 %. °   °FiO2 (%):  [100 %] 100 %  ° °Intake/Output Summary (Last 24 hours) at 08/02/2019 1055 °Last data filed at 08/02/2019 0833 °Gross per 24 hour  °Intake 900 ml  °Output 1255 ml  °Net -355 ml  ° °Filed Weights  ° 07/31/19 1149  °Weight: 66.2 kg  ° ° °Examination: °General: Chronically frail elderly ill appearing elderly male, in NAD °HEENT: [place]/AT, MM pink/moist, PERRL,  °Neuro: Alert and oriented, able to follow all commands  °CV: s1s2 regular rate and rhythm, no murmur, rubs, or gallops,  °PULM:  Diminished bilaterally, slight increased work of breathing with communication °GI: soft, bowel sounds active in all 4 quadrants, non-tender, non-distended °Extremities: warm/dry, ni edema  °Skin: no rashes or lesions ° °Resolved Hospital Problem list   °NA  ° °Assessment & Plan:  ° °Acute hypoxic respiratory failure due to COVID-19 lung fibrosis:  °P: °Patient was started on steroid therapy during hospitalization as he now appears steroid-dependent.  Palliative care met with patient 12/8 with decision made for optimization of symptoms during hospitalization with ultimate goal of discharge home with hospice care.  From pulmonology's standpoint patient may be discharged home with hospice/comfort measures in place.  No indication from pulmonology standpoint to continue steroid therapy at time of discharge but discussion help with palliative medicaine who recommended continued steroid for symptom management, will defer to palliative team for dosing.  No further interventions per pulmonology team apart from comfort measures. ° °PCCM will sign off. Thank you for the opportunity to participate in this patient's care. Please contact if we can be of further assistance. ° ° °Labs   °CBC: °Recent Labs  °Lab  07/31/19 °1225 07/31/19 °1751 08/01/19 °0400 08/01/19 °1830  °WBC 12.8*  --  11.3* 9.7  °NEUTROABS 10.7*  --   --  8.9*  °HGB 15.8 14.6 14.9 14.4  °HCT 49.2 43.0 46.1 43.6  °MCV 91.4  --  91.1 89.9  °PLT 159  --  138* 146*  ° ° °Basic Metabolic Panel: °Recent Labs  °Lab 07/31/19 °1225 07/31/19 °1751 08/01/19 °0400  °NA 139 136 140  °K 4.7 4.9 4.7  °CL 98  --  101  °CO2 31  --  29  °GLUCOSE 117*  --  146*  °BUN 26*  --  35*  °CREATININE 0.86  --  0.87  °CALCIUM 9.4  --  9.4  ° °GFR: °Estimated Creatinine Clearance: 58.1 mL/min (by C-G formula based on SCr of 0.87 mg/dL). °Recent Labs  °Lab 07/31/19 °1225 07/31/19 °1240 07/31/19 °1621 08/01/19 °0400 08/01/19 °1830  °WBC 12.8*  --   --  11.3* 9.7  °LATICACIDVEN  --  1.3 1.5  --   --   ° ° °Liver Function Tests: °Recent Labs  °Lab 07/31/19 °1225  °AST 38  °ALT 35  °ALKPHOS 53  °BILITOT 1.5*  °  PROT 6.6  °ALBUMIN 2.9*  ° °No results for input(s): LIPASE, AMYLASE in the last 168 hours. °No results for input(s): AMMONIA in the last 168 hours. ° °ABG °   °Component Value Date/Time  ° PHART 7.401 07/31/2019 1751  ° PCO2ART 52.7 (H) 07/31/2019 1751  ° PO2ART 221.0 (H) 07/31/2019 1751  ° HCO3 32.8 (H) 07/31/2019 1751  ° TCO2 34 (H) 07/31/2019 1751  ° O2SAT 100.0 07/31/2019 1751  °  ° °Coagulation Profile: °Recent Labs  °Lab 07/31/19 °1225  °INR 1.0  ° ° °Cardiac Enzymes: °No results for input(s): CKTOTAL, CKMB, CKMBINDEX, TROPONINI in the last 168 hours. ° °HbA1C: °No results found for: HGBA1C ° °CBG: °No results for input(s): GLUCAP in the last 168 hours. ° ° °Signature:  ° °Whitney F Davis, NP-C °[facility] °After hours pager: [phone]. °08/02/2019, 11:09 AM ° ° ° ° °

## 2019-08-02 NOTE — Progress Notes (Signed)
[facility]  Pt has been approved for hospice services.  He will need additional oxygen equipment in home this has been ordered to be delivered ASAP.   He will also need a script [facility] [place] [address] for  Roxanol 20mg /1 ml-- 0.25ml (10mg ) Q 4 hours as needed SOB, air hunger  46ml solution  Pharmacy has medication I have already checked-- We will need this in home for pt to have over night possibly until our MD can refill.   Daughter is suppose to notify us when t equipment in home and I will notify the [place] for arrangement of ambulance.  I will also arrange for hospice nurse to do initial visit after arriving in home. Jannette Fogo RN [phone]

## 2019-08-02 NOTE — Progress Notes (Signed)
Daily Progress Note   Patient Name: Alejandro Hicks       Date: 08/02/2019 DOB: 1933-11-01  Age: 85 y.o. MRN#: [id number] Attending Physician: Narda Bonds, MD Primary Care Physician: Georgann Housekeeper, MD Admit Date: 07/31/2019  Reason for Consultation/Follow-up: Establishing goals of care  Subjective: Patient in bed, awake and alert. PMT received call from patient's daughter this morning requesting to take patient home ASAP with Hospice.  Patient reports relief of dyspnea with use of sublingual morphine. IV steroids also have been helpful.   ROS  Length of Stay: 2  Current Medications: Scheduled Meds:  . ALPRAZolam  0.25 mg Oral QHS  . aspirin EC  81 mg Oral Daily  . cholecalciferol  5,000 Units Oral q morning - 10a  . [START ON 08/03/2019] dexamethasone  2 mg Oral BID WC  . enoxaparin (LOVENOX) injection  40 mg Subcutaneous Q24H  . fluticasone  1 spray Each Nare Daily  . guaiFENesin  600 mg Oral BID  . ipratropium-albuterol  3 mL Nebulization Q6H  . methylPREDNISolone (SOLU-MEDROL) injection  40 mg Intravenous Q12H  . pantoprazole  40 mg Oral Daily  . vitamin C  1,000 mg Oral BID    Continuous Infusions:   PRN Meds: acetaminophen, dextromethorphan, HYDROcodone-acetaminophen, LORazepam, morphine injection, morphine CONCENTRATE, ondansetron **OR** ondansetron (ZOFRAN) IV  Physical Exam          Vital Signs: BP 133/65   Pulse 93   Temp 97.9 F (36.6 C) (Oral)   Resp (!) 21   Ht 5\' 6"  (1.676 m)   Wt 66.2 kg   SpO2 98%   BMI 23.57 kg/m  SpO2: SpO2: 98 % O2 Device: O2 Device: NRB O2 Flow Rate: O2 Flow Rate (L/min): 15 L/min  Intake/output summary:   Intake/Output Summary (Last 24 hours) at 08/02/2019 1139 Last data filed at 08/02/2019 14/04/2019 Gross per 24 hour  Intake  900 ml  Output 1255 ml  Net -355 ml   LBM: Last BM Date: 07/31/19(per pt) Baseline Weight: Weight: 66.2 kg Most recent weight: Weight: 66.2 kg       Palliative Assessment/Data: PPS: 20%     Patient Active Problem List   Diagnosis Date Noted  . Respiratory failure (HCC) 08/01/2019  . DNR (do not resuscitate) 08/01/2019  . Palliative care by specialist   .  Advanced care planning/counseling discussion   . Goals of care, counseling/discussion   . COVID-19   . Acute respiratory failure with hypoxia ([place])   . Pneumonia due to COVID-19 virus 06/09/2019  . GERD (gastroesophageal reflux disease) 06/09/2019  . Acute hypoxemic respiratory failure due to COVID-19 ([place]) 06/09/2019  . Elevated liver enzymes 06/09/2019  . Pneumonia due to 2019-nCoV 05/28/2019  . Primary osteoarthritis of right hip 12/13/2018  . Plantar fasciitis, right 02/16/2018  . Primary osteoarthritis of right ankle 01/19/2018  . Shoulder impingement syndrome, left 10/01/2017  . Rib pain on right side 12/09/2015  . Right shoulder pain with history of right proximal biceps rupture 12/09/2015  . Essential hypertension, benign 10/21/2015  . Lumbar degenerative disc disease 09/02/2015  . DDD (degenerative disc disease), cervical 08/05/2015    Palliative Care Assessment & Plan   Patient Profile: 85 y.o. male  with past medical history of COVID + pneumonia with residual pulmonary fibrosis, OSA, GERD, BPH, asthma, anxiety admitted on 07/31/2019 with increasing SOB. He has had several admissions related to his COVID pneumonia in the last two months. 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 Palliative medicine consulted for GOC, symptom management.   Assessment/Recommendations/Plan   Continue morphine concentrate 10mg  sublingual q1hr prn for SOB  Add lorazepam 2mg  q4hr po for anxiety, sleep, or SOB unrelieved with morphine  Recommend continuing oral steroid at discharge for comfort  and appetite- dexamethasone 4mg  BID - before breakfast and lunch  Opioid bowel prophylaxis- senna 1 po bid  D/C home with hospice- discharging physician- please kindly order above on discharge for comfort  Goals of Care and Additional Recommendations:  Limitations on Scope of Treatment: Avoid Hospitalization  Code Status:  DNR  Prognosis:   < 2 weeks due to increasing O2 requirements in setting of progressive COVID pulmonary fibrosis with desire to discharge home with Hospice for end of life care  Discharge Planning:  Home with Hospice  Care plan was discussed with patient.   Thank you for allowing the Palliative Medicine Team to assist in the care of this patient.   Time In: 1045 Time Out: 1145 Total Time 60 minutes Prolonged Time Billed no      Greater than 50%  of this time was spent counseling and coordinating care related to the above assessment and plan.  Mariana Kaufman, AGNP-C Palliative Medicine   Please contact Palliative Medicine Team phone at [phone] for questions and concerns.

## 2019-08-05 LAB — CULTURE, BLOOD (ROUTINE X 2)
Culture: NO GROWTH
Culture: NO GROWTH
Special Requests: ADEQUATE

## 2019-09-20 ENCOUNTER — Telehealth: Payer: Self-pay | Admitting: Internal Medicine

## 2019-09-20 NOTE — Telephone Encounter (Signed)
LMTCB for Michelle 

## 2019-09-20 NOTE — Telephone Encounter (Signed)
Spoke with Alejandro Hicks with Hospice  She is calling just as an FYI to let Dr Celine Mans know that pt is under Hospice care now  His attending is Dr. Orson Gear at [phone]  Will forward to Dr Celine Mans to make her aware

## 2019-09-20 NOTE — Telephone Encounter (Signed)
Marcelino Duster returning call.  [phone]

## 2019-09-22 NOTE — Telephone Encounter (Signed)
Thank you :)

## 2019-11-13 ENCOUNTER — Ambulatory Visit (INDEPENDENT_AMBULATORY_CARE_PROVIDER_SITE_OTHER): Admitting: Internal Medicine

## 2019-11-13 ENCOUNTER — Other Ambulatory Visit: Payer: Self-pay

## 2019-11-13 ENCOUNTER — Encounter: Payer: Self-pay | Admitting: Internal Medicine

## 2019-11-13 DIAGNOSIS — J8410 Pulmonary fibrosis, unspecified: Secondary | ICD-10-CM | POA: Diagnosis not present

## 2019-11-13 DIAGNOSIS — J9611 Chronic respiratory failure with hypoxia: Secondary | ICD-10-CM | POA: Diagnosis not present

## 2019-11-13 DIAGNOSIS — U071 COVID-19: Secondary | ICD-10-CM

## 2019-11-13 NOTE — Progress Notes (Signed)
Virtual Visit via Telephone Note  I connected with Alejandro Hicks on 11/13/19 at 10:00 AM EDT by a video enabled telemedicine application and verified that I am speaking with the correct person using two identifiers.  Location: Patient: Home Provider: Office - [facility] - [address], [address], [place], [place] [address]  I discussed the limitations of evaluation and management by telemedicine and the availability of in person appointments. The patient expressed understanding and agreed to proceed. I also discussed with the patient that there may be a patient responsible charge related to this service. The patient expressed understanding and agreed to proceed.  Patient consented to consult via telephone: Yes People present and their role in pt care: Pt, Hicks, Alejandro Hicks  History of Present Illness: Alejandro Hicks is an 86 year old gentleman who initially presented to see me in December for respiratory failure after COVID-19 pneumonia.  He had a history of pre-existing emphysema and had a prolonged hospital stay for COVID-19.  He had a readmission early December and was discharged to [facility] on December 9.  Current symptom regimen: MS contin 15 mg  roxanal 20 mg 4-5 times/day 0.5 -1 mg 4-5 times/day.   Gets up to 15LNC just to get to bedside commode.  Has not left the house, spends all day in bed.  On dexamethasone 4 mg BID.  No adverse effects. seroquel at night time.   I was on speaker phone with Alejandro Hicks, Alejandro Hicks, as well as the Alejandro Hicks.  Mr. Sheppard Penton has questions about Alejandro lung condition including whether or not he should get the COVID-19 vaccine, how bad Alejandro lungs are, whether or not he should do a clinical trial, whether or not he should proceed with any additional testing.  Observations/Objective:  Social History   Tobacco Use  Smoking Status Former Smoker  . Packs/day: 0.75  . Years: 10.00  . Pack years: 7.50  . Types:  Cigarettes  . Quit date: 42  . Years since quitting: 51.2  Smokeless Tobacco Never Used   Immunization History  Administered Date(s) Administered  . Influenza, High Dose Seasonal PF 04/25/2019  . Influenza-Unspecified 06/13/2015  . Pneumococcal Polysaccharide-23 08/30/2004  . Td 08/30/2002, 11/02/2013  . 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  On Alejandro  I discussed Alejandro Hicks's condition with him at length.  He has been unable to leave the immediate vicinity of Alejandro hospital bed in the home.  I do not think he should receive the COVID-19 vaccine.  I do not think he needs any additional testing.  Alejandro pulmonary condition has worsened in the interim and he does not responded to steroids unfortunately.  He would not be able to tolerate any further diagnostic testing including bronchoscopy or pulmonary function testing, including clinical trials.  We reviewed the indications for Alejandro, and that while I wish there was more treatment options available for him I think that he is too sick.  We reviewed the importance of symptom management.  Alejandro questions were answered.  Alejandro Alejandro Hicks and Hicks were present and all questions were answered.  I am happy to be available by phone as needed for Alejandro Hicks and Alejandro care team as needed.   I discussed the assessment and treatment plan with the patient. The patient was provided an opportunity to ask questions and all were answered. The patient agreed with the plan  and demonstrated an understanding of the instructions.   The patient was advised to call back or seek an in-person evaluation if the symptoms worsen or if the condition fails to improve as anticipated.  I provided 24 minutes of non-face-to-face time during this encounter.   Spero Geralds, MD

## 2020-01-23 DEATH — deceased

## 2020-12-25 IMAGING — CR DG CHEST 2V
1 series · 2 of 2 positions shown · non-contrast
Comparison: October 04, 2017

CLINICAL DATA: Cough, covid

EXAM:
CHEST - 2 VIEW

[Series 1: w chest pa · 0.14mm/px · 2 of 2 slices shown]
[im 1/2]
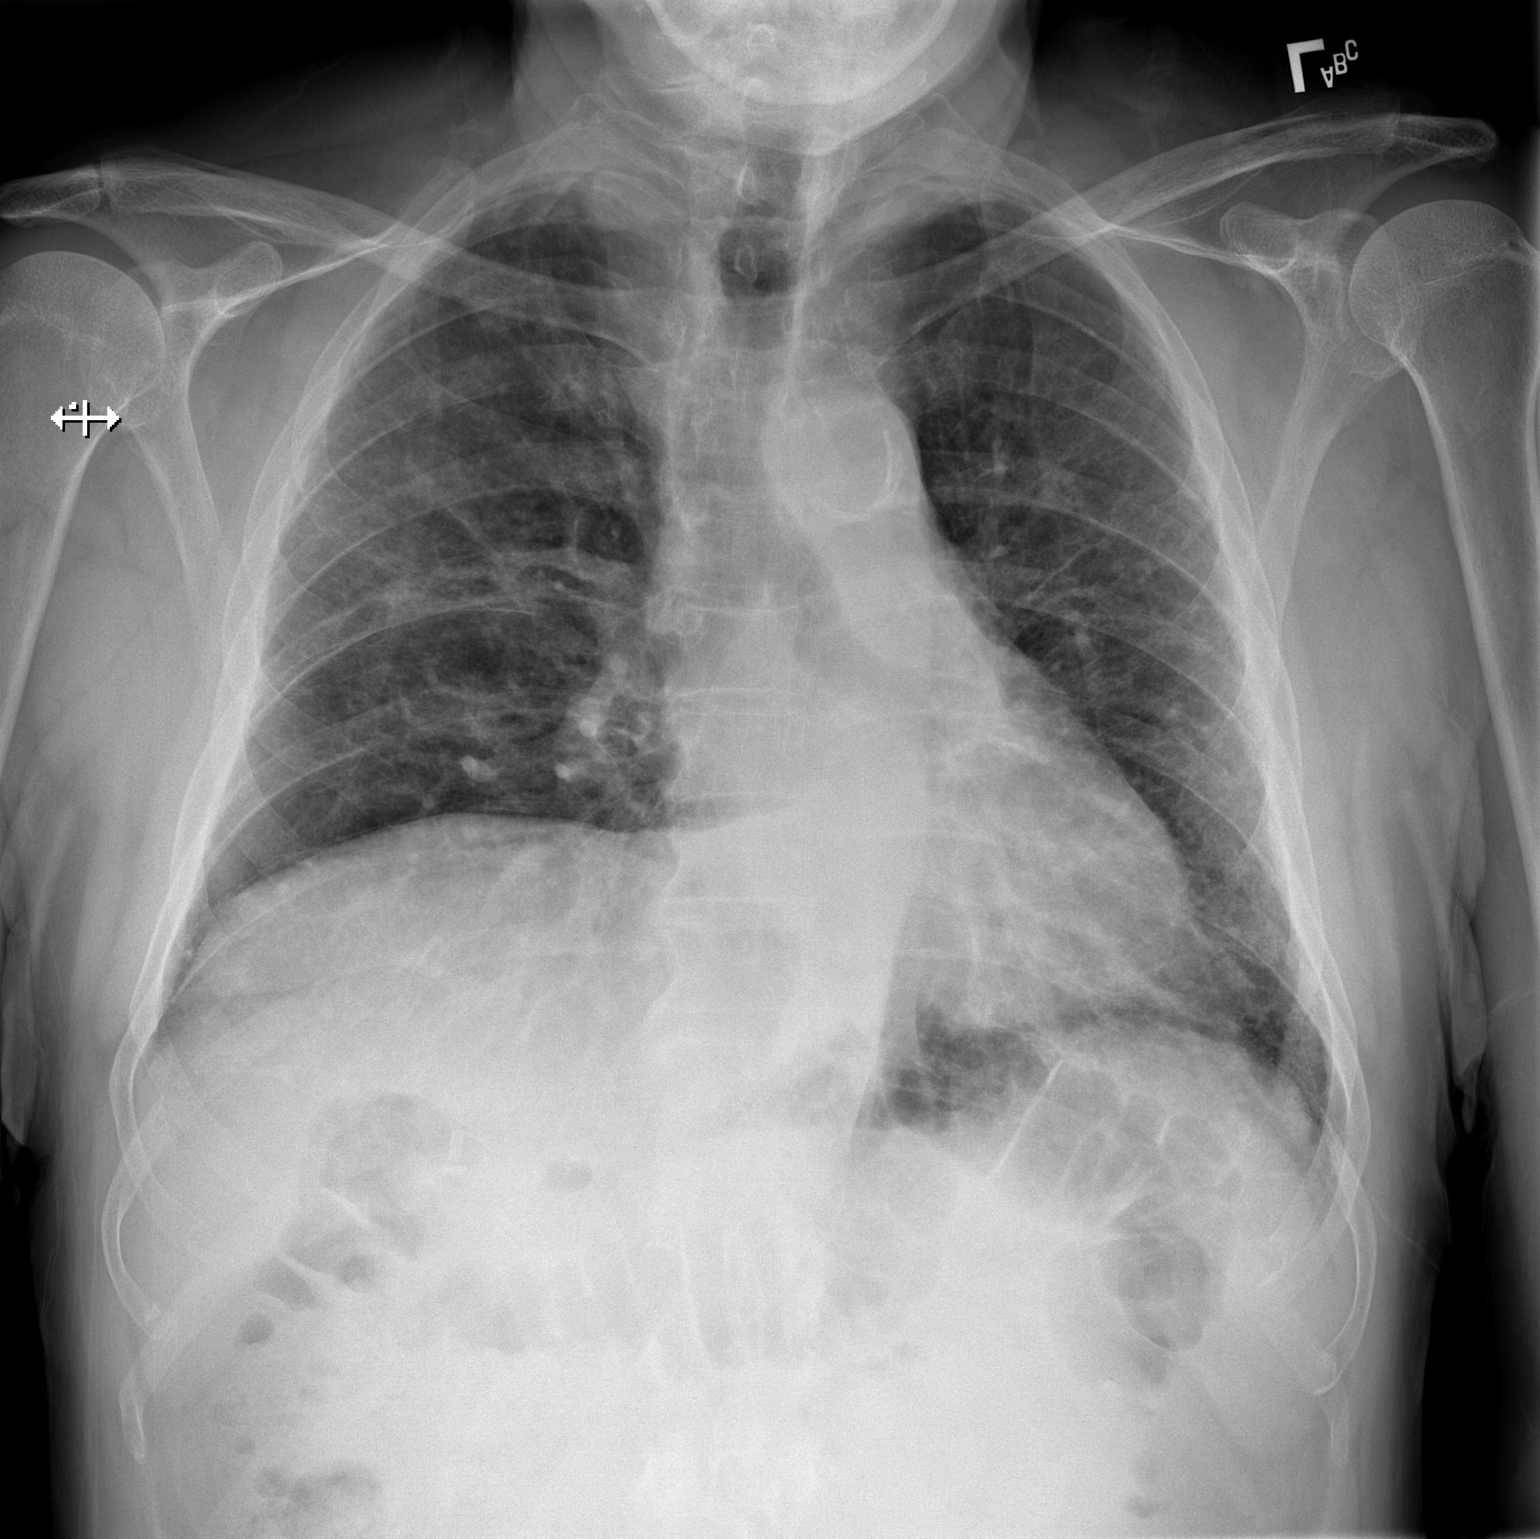
[im 2/2]
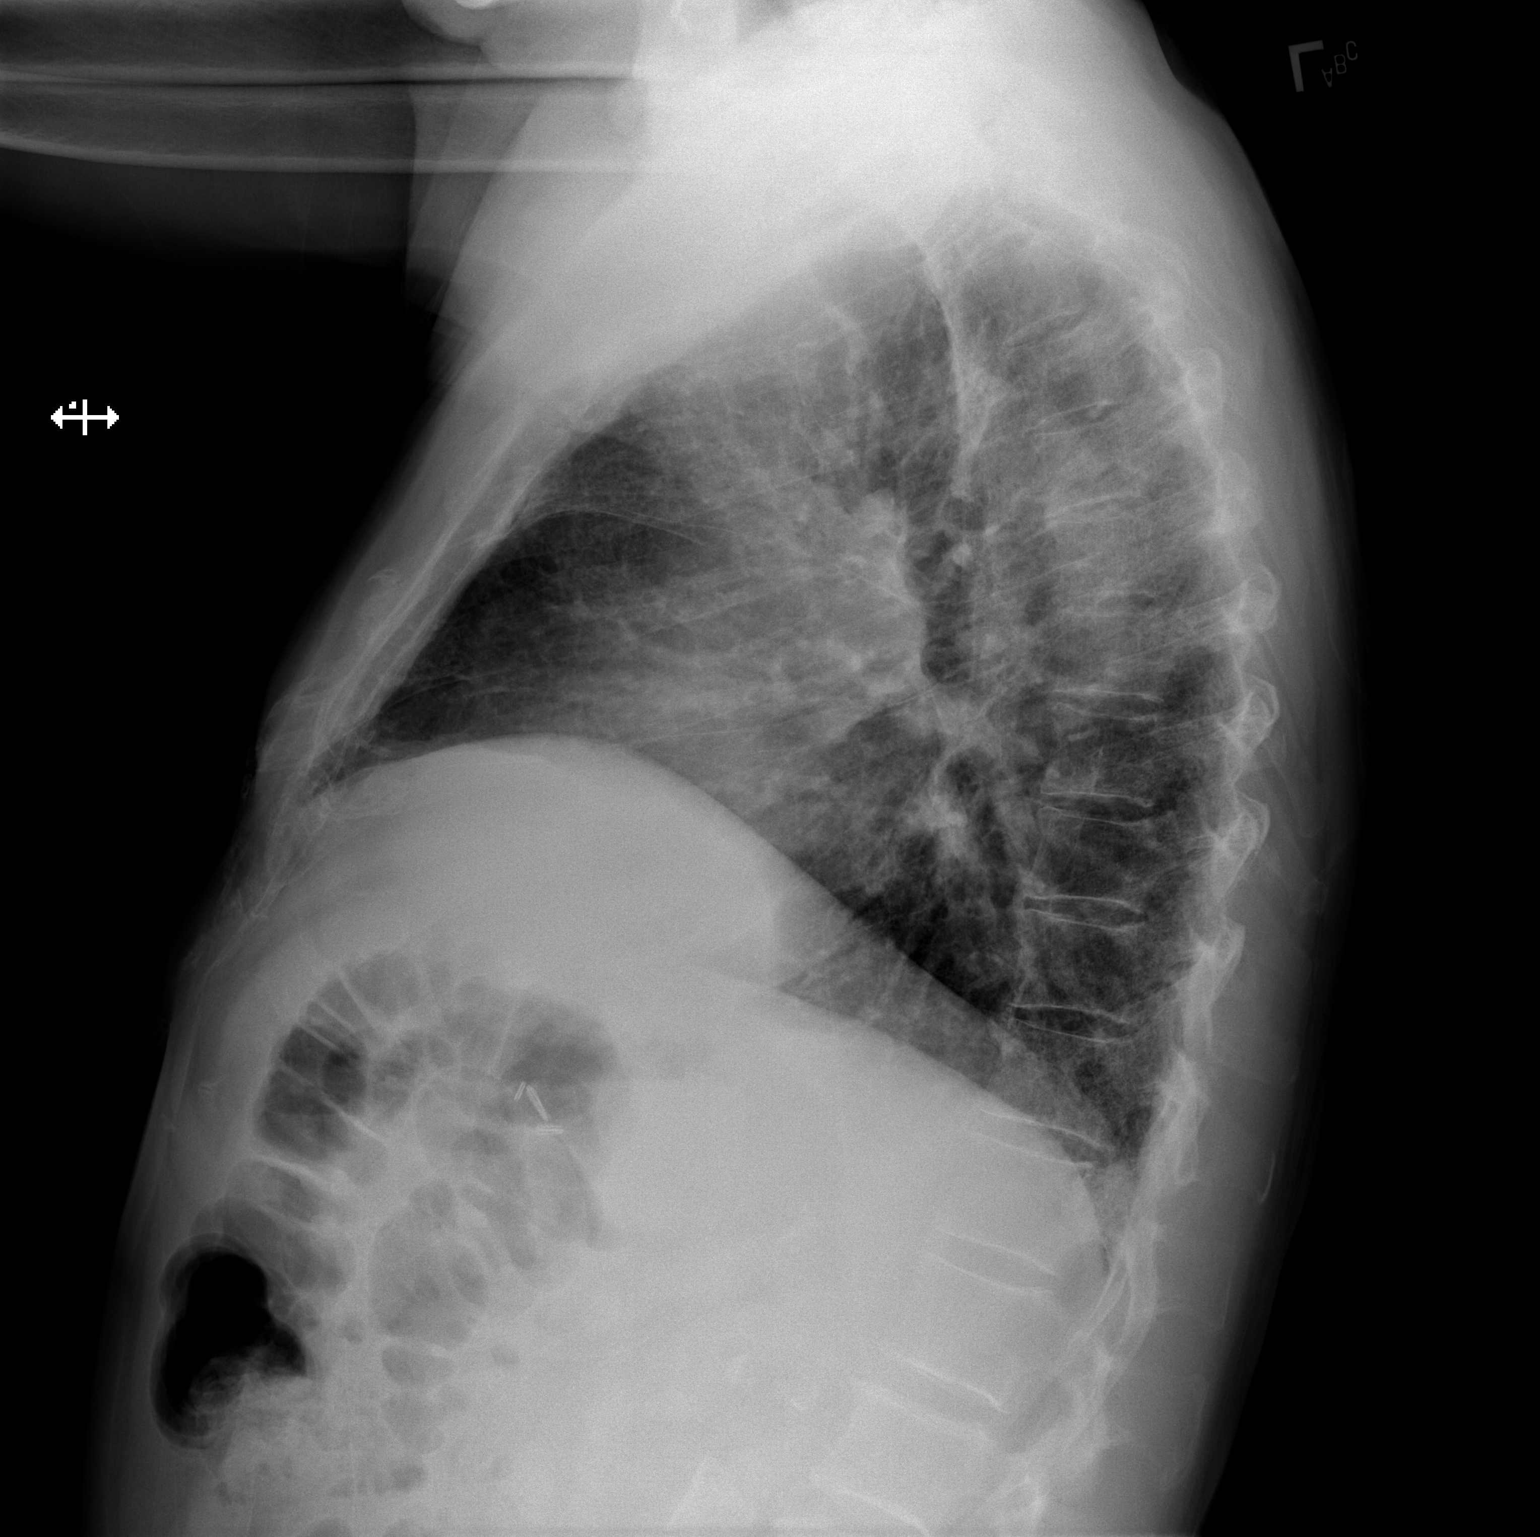

[2 of 2 positions shown; findings below may reference images not displayed]

FINDINGS: The heart size and mediastinal contours are within normal limits.
There is mildly increased hazy airspace opacity seen within the
periphery of the right upper lung and the left lower lung. Aortic
knob calcifications are seen. No acute osseous abnormality.
IMPRESSION: Hazy peripheral airspace opacity is within both lungs. The findings
in the lungs are nonspecific, but concerning for atypical infection,
which includes viral pneumonia.

## 2021-01-07 IMAGING — CT CT ANGIO CHEST
2 of 6 series · 18 of 46 positions shown · IV contrast (omnipaque)
Comparison: Radiograph 06/09/2019

CLINICAL DATA: PE suspected, high pretest probability, [id number]
positive 1 week prior currently on 3 L home O2

EXAM:
CT ANGIOGRAPHY CHEST WITH CONTRAST
TECHNIQUE: Multidetector CT imaging of the chest was performed using the
standard protocol during bolus administration of intravenous
contrast. Multiplanar CT image reconstructions and MIPs were
obtained to evaluate the vascular anatomy.
CONTRAST:  100mL OMNIPAQUE IOHEXOL 350 MG/ML SOLN

[Series 6: thins · axial · 0.73mm/px · z∈[+1202,+1452]mm · 15 of 273 slices shown]
[im 12/273  lung]
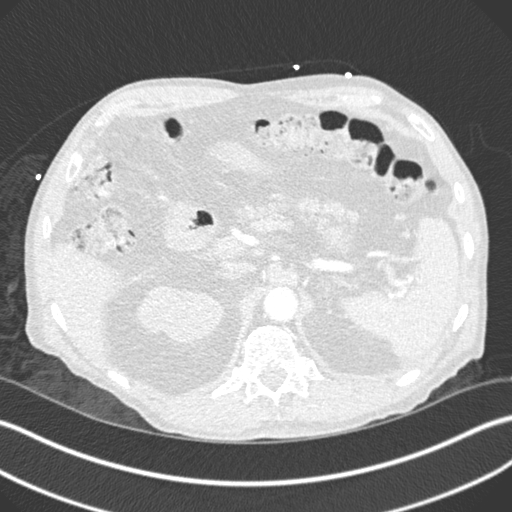
[im 36/273  soft-tissue]
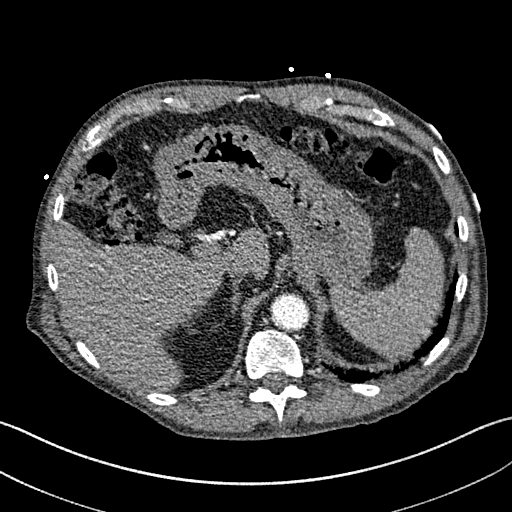
[im 48/273  lung]
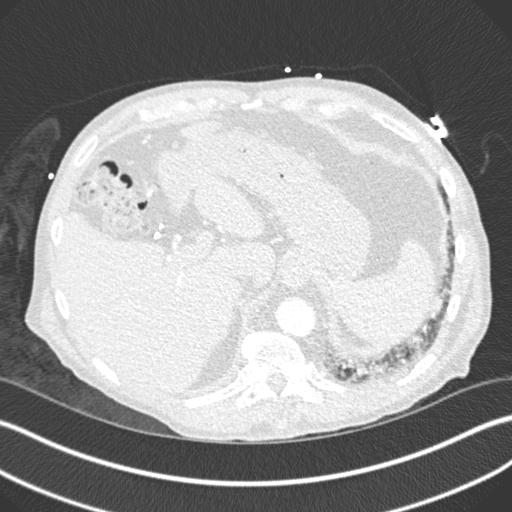
[im 71/273  soft-tissue]
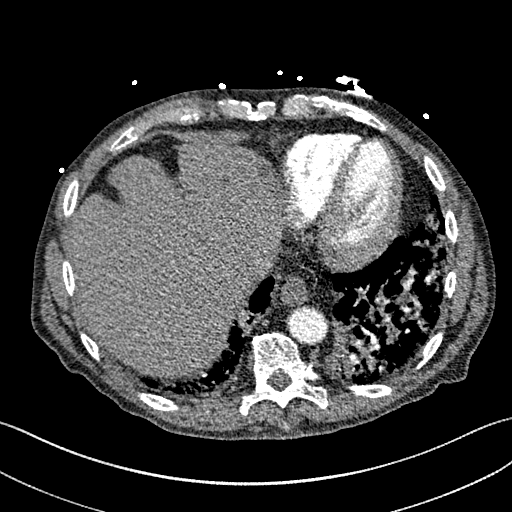
[im 83/273  lung]
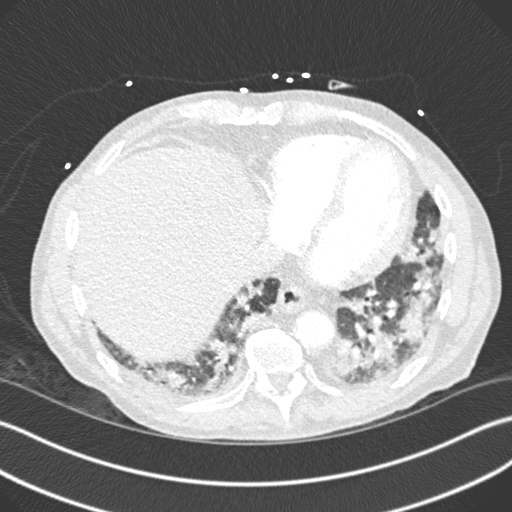
[im 107/273  soft-tissue]
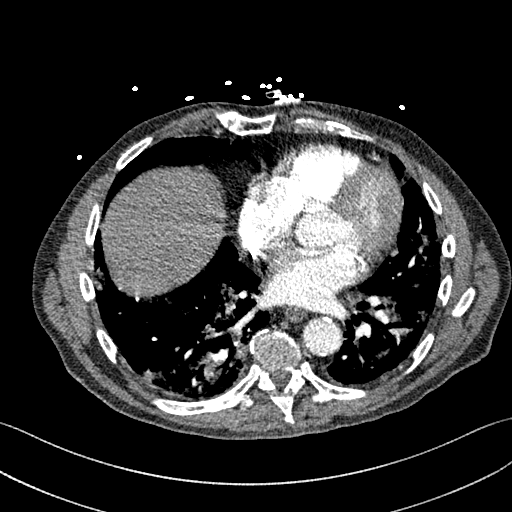
[im 119/273  lung]
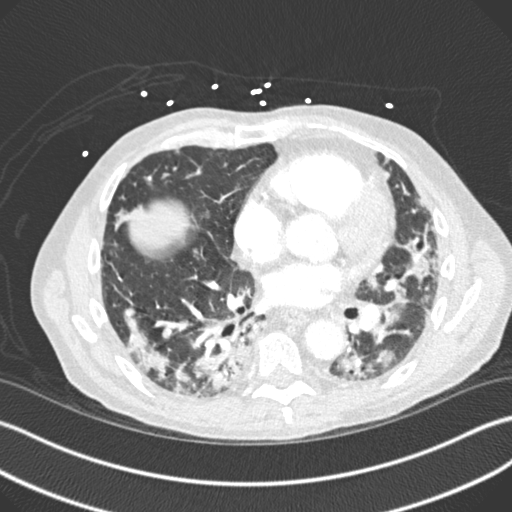
[im 142/273  soft-tissue]
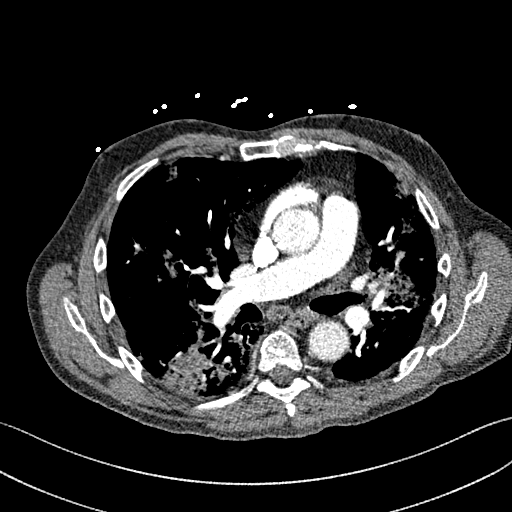
[im 154/273  lung]
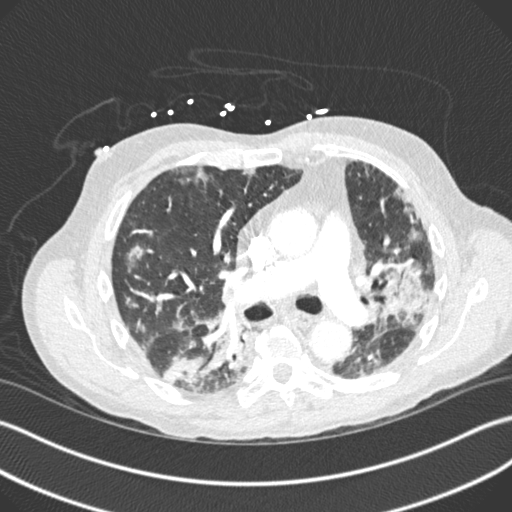
[im 166/273  soft-tissue]
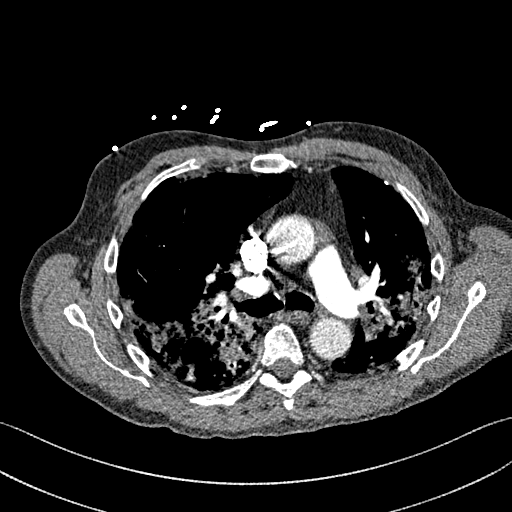
[im 190/273  lung]
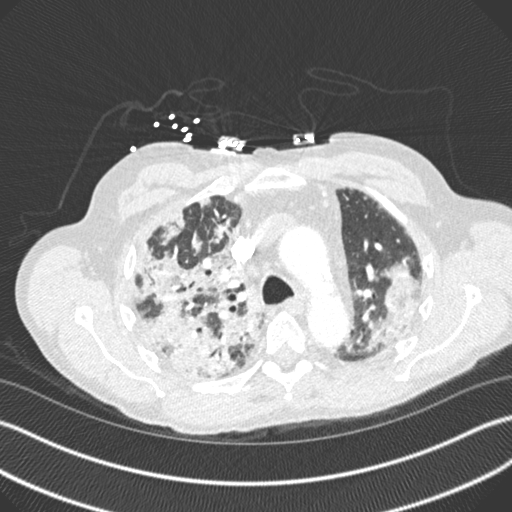
[im 202/273  soft-tissue]
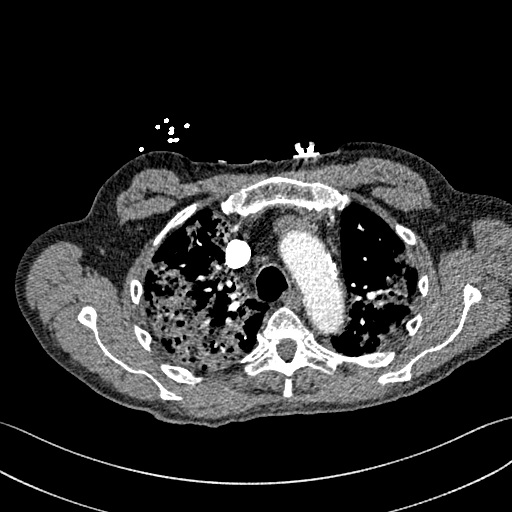
[im 225/273  lung]
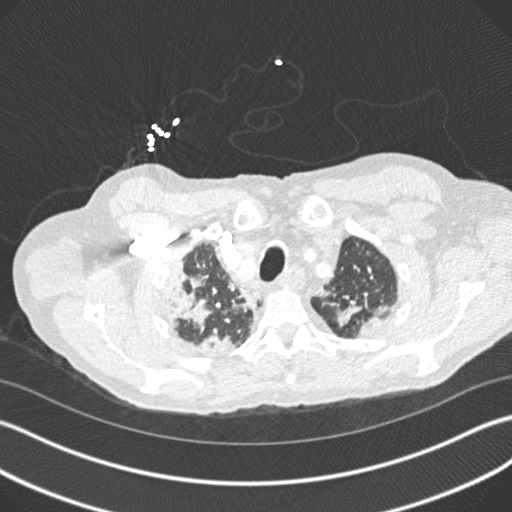
[im 237/273  soft-tissue]
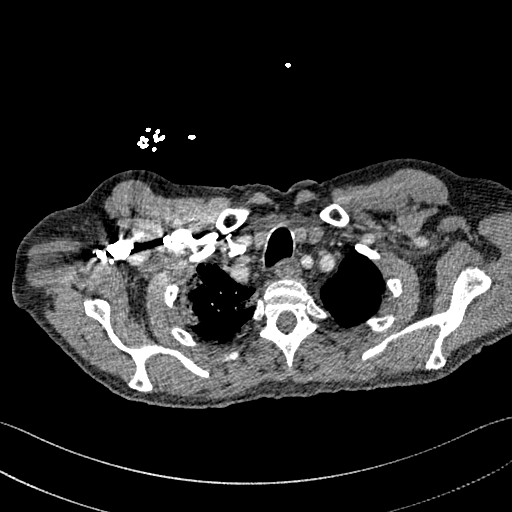
[im 261/273  lung]
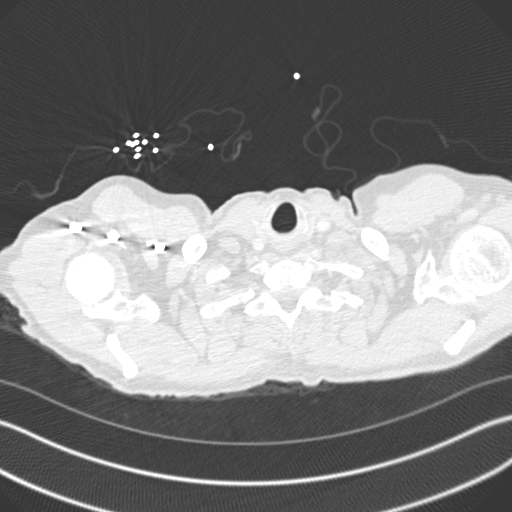

[Series 8: coronal mpr · coronal · 0.54mm/px · 3 of 148 slices shown]
[im 37/148  soft-tissue]
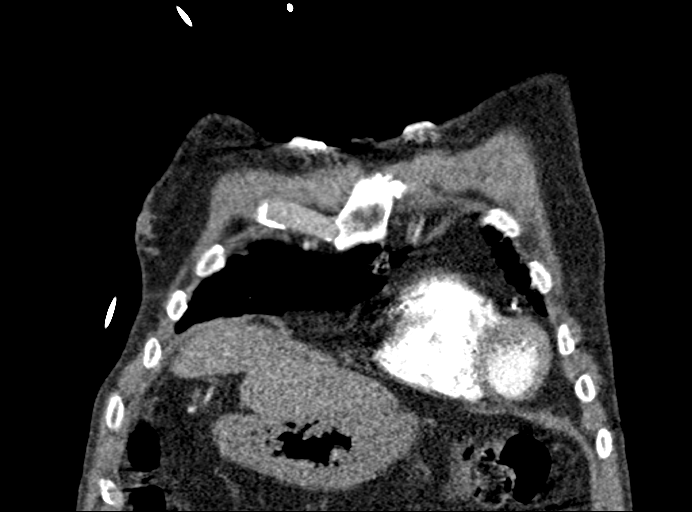
[im 74/148  soft-tissue]
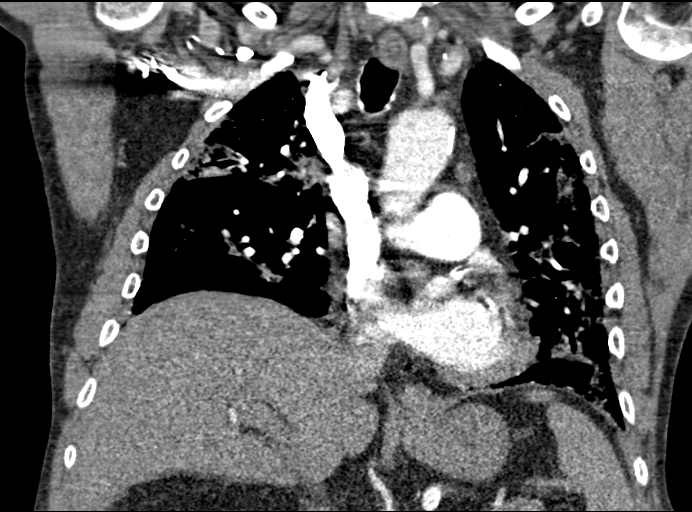
[im 111/148  soft-tissue]
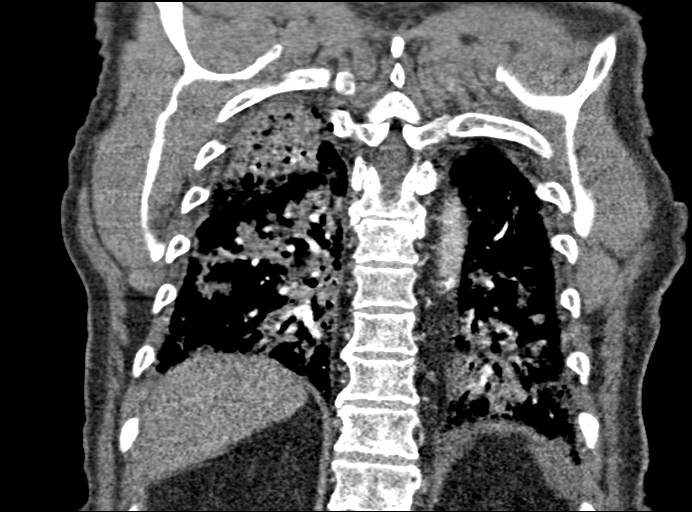

[18 of 46 positions shown; findings below may reference images not displayed]

FINDINGS: Cardiovascular: Satisfactory opacification of the pulmonary arteries
to the segmental level. No evidence of pulmonary embolism. Normal
heart size. No pericardial effusion. Atherosclerotic calcification
of the coronary arteries. Atherosclerotic plaque within the normal
caliber aorta. Normal 3 vessel branching of the aortic arch. No
evident abnormality of proximal great vessels.

Mediastinum/Nodes: Prominent low-attenuation mediastinal nodes are
likely reactive. No pathologically enlarged mediastinal, hilar
axillary adenopathy.

Lungs/Pleura: There are multifocal areas airspace consolidation with
surrounding patchy areas of ground-glass opacity and intervening air
bronchograms. No pneumothorax or effusion. Some atelectatic volume
loss is noted in the left lung.

Upper Abdomen: Post cholecystectomy. No acute abnormalities present
in the visualized portions of the upper abdomen.

Musculoskeletal: Multilevel degenerative changes are present in the
imaged portions of the spine. No acute osseous abnormality or
suspicious osseous lesion. Mild body wall edema. Mild right
gynecomastia.

Review of the MIP images confirms the above findings.
IMPRESSION: 1. No evidence of pulmonary embolism.
2. Multifocal areas of airspace consolidation with surrounding
patchy areas of ground-glass opacity and intervening air
bronchograms, compatible with multifocal pneumonia including
UD6MR-JL etiology.
3. Aortic Atherosclerosis ([id number]).
